# Patient Record
Sex: Male | Born: 1990 | Race: Black or African American | Hispanic: No | Marital: Single
Health system: Southern US, Community
[De-identification: ages and names within clinical notes are randomized; demographics above are authoritative.]

## PROBLEM LIST (undated history)

## (undated) ENCOUNTER — Ambulatory Visit (HOSPITAL_COMMUNITY): Payer: No Payment, Other

## (undated) ENCOUNTER — Emergency Department (HOSPITAL_COMMUNITY): Disposition: A | Discharge status: Home / Self Care | Payer: PRIVATE HEALTH INSURANCE

---
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
## 2011-10-09 ENCOUNTER — Emergency Department (HOSPITAL_COMMUNITY): Payer: PRIVATE HEALTH INSURANCE

## 2011-10-09 ENCOUNTER — Emergency Department (HOSPITAL_COMMUNITY)
Admission: EM | Admit: 2011-10-09 | Discharge: 2011-10-09 | Disposition: A | Discharge status: Home / Self Care | Payer: PRIVATE HEALTH INSURANCE | Attending: Emergency Medicine | Admitting: Emergency Medicine

## 2011-10-09 ENCOUNTER — Encounter (HOSPITAL_COMMUNITY): Payer: Self-pay | Admitting: Emergency Medicine

## 2011-10-09 DIAGNOSIS — S93409A Sprain of unspecified ligament of unspecified ankle, initial encounter: Secondary | ICD-10-CM | POA: Insufficient documentation

## 2011-10-09 DIAGNOSIS — M25579 Pain in unspecified ankle and joints of unspecified foot: Secondary | ICD-10-CM | POA: Insufficient documentation

## 2011-10-09 DIAGNOSIS — S93401A Sprain of unspecified ligament of right ankle, initial encounter: Secondary | ICD-10-CM

## 2011-10-09 MED ORDER — IBUPROFEN 800 MG PO TABS
800.0000 mg | ORAL_TABLET | Freq: Once | ORAL | Status: AC
  Administered 2011-10-09: 800 mg via ORAL
  Filled 2011-10-09: qty 1

## 2011-10-09 MED ORDER — IBUPROFEN 800 MG PO TABS: 800.0000 mg | ORAL_TABLET | Freq: Three times a day (TID) | ORAL | Status: AC

## 2011-10-09 NOTE — ED Notes (Signed)
Dr Opitz bedside 

## 2011-10-09 NOTE — ED Provider Notes (Signed)
History     CSN: [id number]  Arrival date & time 10/09/11  0458   First MD Initiated Contact with Patient 10/09/11 0502      Chief Complaint  Patient presents with  . Ankle Pain    (Consider location/radiation/quality/duration/timing/severity/associated sxs/prior treatment) HPI History provided by patient. Skateboarding tonight about 5 hours ago, down a hill. He lost control, jumped off a skateboard and rolled his right ankle. He complains of pain and swelling to the medial aspect of his ankle it hurts to walk on. No broken skin or bleeding. No knee or proximal fibular pain. No fall or extremity injury. No head or neck injury. No weakness or numbness. No history of ankle injury in the past. Pain is sharp in quality and not radiating. Moderate in severity.  No past medical history on file.  No past surgical history on file.  No family history on file.  History  Substance Use Topics  . Smoking status: Not on file  . Smokeless tobacco: Not on file  . Alcohol Use: Not on file      Review of Systems  Constitutional: Negative for fever and chills.  HENT: Negative for neck pain and neck stiffness.   Eyes: Negative for pain.  Respiratory: Negative for shortness of breath.   Cardiovascular: Negative for chest pain.  Gastrointestinal: Negative for abdominal pain.  Genitourinary: Negative for dysuria.  Musculoskeletal: Negative for back pain.  Skin: Negative for rash.  Neurological: Negative for headaches.  All other systems reviewed and are negative.    Allergies  Review of patient's allergies indicates not on file.  Home Medications  No current outpatient prescriptions on file.  There were no vitals taken for this visit.  Physical Exam  Constitutional: He is oriented to person, place, and time. He appears well-developed and well-nourished.  HENT:  Head: Normocephalic and atraumatic.  Eyes: Conjunctivae and EOM are normal. Pupils are equal, round, and reactive to  light.  Neck: Trachea normal. Neck supple. No thyromegaly present.  Cardiovascular: Normal rate, regular rhythm, S1 normal, S2 normal and normal pulses.     No systolic murmur is present   No diastolic murmur is present  Pulses:      Radial pulses are 2+ on the right side, and 2+ on the left side.  Pulmonary/Chest: Effort normal. No respiratory distress. He has no rhonchi.  Musculoskeletal:       Right lower extremity: Medial aspect of right ankle tender swelling. No ecchymosis. Equal dorsalis pedis pulses with distal neurovascular intact. Nontender foot, proximal fibula, knee and hip. No other extremity injury or tenderness  Neurological: He is alert and oriented to person, place, and time. He has normal strength. No sensory deficit. GCS eye subscore is 4. GCS verbal subscore is 5. GCS motor subscore is 6.  Skin: Skin is warm and dry. He is not diaphoretic.  Psychiatric: His speech is normal.       Cooperative and appropriate    ED Course  Procedures (including critical care time)  No results found for this or any previous visit. Dg Ankle Complete Right  10/09/2011  *RADIOLOGY REPORT*  Clinical Data: Status post skateboarding injury; pain at the right ankle, more prominent medially.  RIGHT ANKLE - COMPLETE 3+ VIEW  Comparison: None.  Findings: There is no evidence of acute fracture or dislocation. Cortical irregularity along the posterior malleolus is thought to reflect remote injury; suggest clinical correlation for associated symptoms.  The ankle mortise is intact; the interosseous space is within  normal limits.  No talar tilt or subluxation is seen.  The joint spaces are preserved.  Medial soft tissue swelling is noted.  IMPRESSION:  1.  No evidence of acute fracture or dislocation. 2.  Cortical irregularity along the posterior malleolus is thought to reflect remote injury; suggest clinical correlation for associated symptoms.  Original Report Authenticated By: Tonia Ghent, M.D.        MDM   Right ankle injury evaluated with x-rays. Ice and Motrin provided.  X-rays reviewed with patient. Given pain with ambulation, splint provided and crutches as needed. Reliable historian and agrees to followup for any persistent symptoms.       Sunnie Nielsen, MD 10/09/11 [phone]

## 2011-10-09 NOTE — ED Notes (Signed)
Pt alert, nad, c/o right ankle pain, onset last PM, pt injured while skate boarding, PMS intact, + Edema, no obvious deformity noted, resp even unlabored, skin pwd

## 2011-10-09 NOTE — ED Notes (Signed)
Air splint applied.  Crutch instruction given.

## 2012-10-20 ENCOUNTER — Emergency Department (HOSPITAL_COMMUNITY): Payer: PRIVATE HEALTH INSURANCE

## 2012-10-20 ENCOUNTER — Encounter (HOSPITAL_COMMUNITY): Payer: Self-pay | Admitting: Radiology

## 2012-10-20 ENCOUNTER — Inpatient Hospital Stay (HOSPITAL_COMMUNITY)
Admission: EM | Admit: 2012-10-20 | Discharge: 2012-10-24 | DRG: 085 | Disposition: A | Discharge status: Home / Self Care | Payer: PRIVATE HEALTH INSURANCE | Attending: General Surgery | Admitting: General Surgery

## 2012-10-20 DIAGNOSIS — S060X9A Concussion with loss of consciousness of unspecified duration, initial encounter: Secondary | ICD-10-CM

## 2012-10-20 DIAGNOSIS — S069X9A Unspecified intracranial injury with loss of consciousness of unspecified duration, initial encounter: Secondary | ICD-10-CM

## 2012-10-20 DIAGNOSIS — S0291XA Unspecified fracture of skull, initial encounter for closed fracture: Secondary | ICD-10-CM

## 2012-10-20 DIAGNOSIS — S0003XA Contusion of scalp, initial encounter: Secondary | ICD-10-CM | POA: Diagnosis present

## 2012-10-20 DIAGNOSIS — R561 Post traumatic seizures: Secondary | ICD-10-CM | POA: Diagnosis present

## 2012-10-20 DIAGNOSIS — S020XXA Fracture of vault of skull, initial encounter for closed fracture: Secondary | ICD-10-CM

## 2012-10-20 DIAGNOSIS — F17200 Nicotine dependence, unspecified, uncomplicated: Secondary | ICD-10-CM | POA: Diagnosis present

## 2012-10-20 DIAGNOSIS — J95821 Acute postprocedural respiratory failure: Secondary | ICD-10-CM | POA: Diagnosis present

## 2012-10-20 DIAGNOSIS — R1110 Vomiting, unspecified: Secondary | ICD-10-CM | POA: Diagnosis not present

## 2012-10-20 DIAGNOSIS — IMO0002 Reserved for concepts with insufficient information to code with codable children: Secondary | ICD-10-CM | POA: Diagnosis present

## 2012-10-20 DIAGNOSIS — R569 Unspecified convulsions: Secondary | ICD-10-CM

## 2012-10-20 DIAGNOSIS — I498 Other specified cardiac arrhythmias: Secondary | ICD-10-CM | POA: Diagnosis not present

## 2012-10-20 DIAGNOSIS — R4182 Altered mental status, unspecified: Secondary | ICD-10-CM

## 2012-10-20 DIAGNOSIS — S02109A Fracture of base of skull, unspecified side, initial encounter for closed fracture: Principal | ICD-10-CM | POA: Diagnosis present

## 2012-10-20 DIAGNOSIS — Y9351 Activity, roller skating (inline) and skateboarding: Secondary | ICD-10-CM

## 2012-10-20 DIAGNOSIS — J45902 Unspecified asthma with status asthmaticus: Secondary | ICD-10-CM | POA: Diagnosis present

## 2012-10-20 DIAGNOSIS — S06300A Unspecified focal traumatic brain injury without loss of consciousness, initial encounter: Principal | ICD-10-CM | POA: Diagnosis present

## 2012-10-20 DIAGNOSIS — S1093XA Contusion of unspecified part of neck, initial encounter: Secondary | ICD-10-CM | POA: Diagnosis present

## 2012-10-20 DIAGNOSIS — S065XAA Traumatic subdural hemorrhage with loss of consciousness status unknown, initial encounter: Secondary | ICD-10-CM

## 2012-10-20 DIAGNOSIS — S065X9A Traumatic subdural hemorrhage with loss of consciousness of unspecified duration, initial encounter: Secondary | ICD-10-CM

## 2012-10-20 DIAGNOSIS — J9600 Acute respiratory failure, unspecified whether with hypoxia or hypercapnia: Secondary | ICD-10-CM | POA: Diagnosis present

## 2012-10-20 DIAGNOSIS — G9389 Other specified disorders of brain: Secondary | ICD-10-CM | POA: Diagnosis present

## 2012-10-20 DIAGNOSIS — S0990XA Unspecified injury of head, initial encounter: Secondary | ICD-10-CM

## 2012-10-20 DIAGNOSIS — J9690 Respiratory failure, unspecified, unspecified whether with hypoxia or hypercapnia: Secondary | ICD-10-CM

## 2012-10-20 DIAGNOSIS — S0083XA Contusion of other part of head, initial encounter: Secondary | ICD-10-CM

## 2012-10-20 DIAGNOSIS — S060XAA Concussion with loss of consciousness status unknown, initial encounter: Secondary | ICD-10-CM

## 2012-10-20 LAB — COMPREHENSIVE METABOLIC PANEL WITH GFR
Alkaline Phosphatase: 74 U/L (ref 39–117)
BUN: 20 mg/dL (ref 6–23)
CO2: 18 meq/L — ABNORMAL LOW (ref 19–32)
Calcium: 9.5 mg/dL (ref 8.4–10.5)
GFR calc Af Amer: 81 mL/min — ABNORMAL LOW (ref >90)
GFR calc non Af Amer: 70 mL/min — ABNORMAL LOW (ref >90)
Glucose, Bld: 140 mg/dL — ABNORMAL HIGH (ref 70–99)
Total Protein: 8.2 g/dL (ref 6.0–8.3)

## 2012-10-20 LAB — POCT I-STAT, CHEM 8
BUN: 23 mg/dL (ref 6–23)
Calcium, Ion: 1.23 mmol/L (ref 1.12–1.23)
Chloride: 110 meq/L (ref 96–112)
Creatinine, Ser: 1.2 mg/dL (ref 0.50–1.35)
Glucose, Bld: 132 mg/dL — ABNORMAL HIGH (ref 70–99)
HCT: 47 % (ref 39.0–52.0)
Hemoglobin: 16 g/dL (ref 13.0–17.0)
Potassium: 3.8 meq/L (ref 3.5–5.1)
Sodium: 143 mEq/L (ref 135–145)
TCO2: 20 mmol/L (ref 0–100)

## 2012-10-20 LAB — URINALYSIS, ROUTINE W REFLEX MICROSCOPIC
Bilirubin Urine: NEGATIVE
Glucose, UA: NEGATIVE mg/dL
Ketones, ur: NEGATIVE mg/dL
Leukocytes, UA: NEGATIVE
Nitrite: NEGATIVE
Protein, ur: 30 mg/dL — AB
Specific Gravity, Urine: 1.039 — ABNORMAL HIGH (ref 1.005–1.030)
Urobilinogen, UA: 0.2 mg/dL (ref 0.0–1.0)
pH: 5 (ref 5.0–8.0)

## 2012-10-20 LAB — URINE MICROSCOPIC-ADD ON

## 2012-10-20 LAB — COMPREHENSIVE METABOLIC PANEL
ALT: 17 U/L (ref 0–53)
AST: 30 U/L (ref 0–37)
Albumin: 4.8 g/dL (ref 3.5–5.2)
Chloride: 104 mEq/L (ref 96–112)
Creatinine, Ser: 1.41 mg/dL — ABNORMAL HIGH (ref 0.50–1.35)
Potassium: 3.9 mEq/L (ref 3.5–5.1)
Sodium: 143 mEq/L (ref 135–145)
Total Bilirubin: 0.7 mg/dL (ref 0.3–1.2)

## 2012-10-20 LAB — CBC WITH DIFFERENTIAL/PLATELET
Basophils Absolute: 0 10*3/uL (ref 0.0–0.1)
Basophils Relative: 0 % (ref 0–1)
Eosinophils Absolute: 0.6 K/uL (ref 0.0–0.7)
Eosinophils Relative: 4 % (ref 0–5)
HCT: 42.8 % (ref 39.0–52.0)
Hemoglobin: 14 g/dL (ref 13.0–17.0)
Lymphocytes Relative: 54 % — ABNORMAL HIGH (ref 12–46)
Lymphs Abs: 8.7 K/uL — ABNORMAL HIGH (ref 0.7–4.0)
MCH: 31.6 pg (ref 26.0–34.0)
MCHC: 32.7 g/dL (ref 30.0–36.0)
MCV: 96.6 fL (ref 78.0–100.0)
Monocytes Absolute: 1.3 K/uL — ABNORMAL HIGH (ref 0.1–1.0)
Monocytes Relative: 8 % (ref 3–12)
Neutro Abs: 5.5 K/uL (ref 1.7–7.7)
Neutrophils Relative %: 34 % — ABNORMAL LOW (ref 43–77)
Platelets: 259 10*3/uL (ref 150–400)
RBC: 4.43 MIL/uL (ref 4.22–5.81)
RDW: 12.3 % (ref 11.5–15.5)
WBC: 16.1 10*3/uL — ABNORMAL HIGH (ref 4.0–10.5)

## 2012-10-20 LAB — TYPE AND SCREEN
ABO/RH(D): O NEG
Antibody Screen: NEGATIVE
Unit division: 0
Unit division: 0

## 2012-10-20 LAB — RAPID URINE DRUG SCREEN, HOSP PERFORMED
Amphetamines: NOT DETECTED
Barbiturates: NOT DETECTED
Benzodiazepines: POSITIVE — AB
Cocaine: NOT DETECTED
Opiates: NOT DETECTED
Tetrahydrocannabinol: POSITIVE — AB

## 2012-10-20 LAB — POCT I-STAT 3, ART BLOOD GAS (G3+)
Acid-base deficit: 4 mmol/L — ABNORMAL HIGH (ref 0.0–2.0)
Bicarbonate: 21.4 meq/L (ref 20.0–24.0)
O2 Saturation: 100 %
Patient temperature: 98.6
TCO2: 23 mmol/L (ref 0–100)
pCO2 arterial: 41.4 mmHg (ref 35.0–45.0)
pH, Arterial: 7.322 — ABNORMAL LOW (ref 7.350–7.450)
pO2, Arterial: 416 mmHg — ABNORMAL HIGH (ref 80.0–100.0)

## 2012-10-20 LAB — ETHANOL: Alcohol, Ethyl (B): <11 mg/dL (ref 0–11)

## 2012-10-20 LAB — CG4 I-STAT (LACTIC ACID): Lactic Acid, Venous: 12.04 mmol/L — ABNORMAL HIGH (ref 0.5–2.2)

## 2012-10-20 LAB — ABO/RH: ABO/RH(D): O NEG

## 2012-10-20 MED ORDER — SUCCINYLCHOLINE CHLORIDE 20 MG/ML IJ SOLN
INTRAMUSCULAR | Status: AC
  Administered 2012-10-20: 80 mg
  Filled 2012-10-20: qty 1

## 2012-10-20 MED ORDER — POTASSIUM CHLORIDE IN NACL 20-0.9 MEQ/L-% IV SOLN
INTRAVENOUS | Status: DC
  Administered 2012-10-20 – 2012-10-23 (×7): via INTRAVENOUS
  Filled 2012-10-20 (×9): qty 1000

## 2012-10-20 MED ORDER — PROPOFOL 10 MG/ML IV EMUL: 5.0000 ug/kg/min | Freq: Once | INTRAVENOUS | Status: DC

## 2012-10-20 MED ORDER — CHLORHEXIDINE GLUCONATE 0.12 % MT SOLN
15.0000 mL | Freq: Two times a day (BID) | OROMUCOSAL | Status: DC
  Administered 2012-10-20 – 2012-10-21 (×2): 15 mL via OROMUCOSAL
  Filled 2012-10-20 (×2): qty 15

## 2012-10-20 MED ORDER — MIDAZOLAM HCL 2 MG/2ML IJ SOLN
INTRAMUSCULAR | Status: AC
  Administered 2012-10-20: 4 mg
  Filled 2012-10-20: qty 4

## 2012-10-20 MED ORDER — PNEUMOCOCCAL VAC POLYVALENT 25 MCG/0.5ML IJ INJ
0.5000 mL | INJECTION | INTRAMUSCULAR | Status: AC
  Administered 2012-10-22: 0.5 mL via INTRAMUSCULAR
  Filled 2012-10-20 (×2): qty 0.5

## 2012-10-20 MED ORDER — ONDANSETRON HCL 4 MG/2ML IJ SOLN
4.0000 mg | Freq: Four times a day (QID) | INTRAMUSCULAR | Status: DC | PRN
  Administered 2012-10-21 (×2): 4 mg via INTRAVENOUS
  Filled 2012-10-20 (×3): qty 2

## 2012-10-20 MED ORDER — PANTOPRAZOLE SODIUM 40 MG PO TBEC
40.0000 mg | DELAYED_RELEASE_TABLET | Freq: Every day | ORAL | Status: DC
  Administered 2012-10-22 – 2012-10-23 (×2): 40 mg via ORAL
  Filled 2012-10-20 (×2): qty 1

## 2012-10-20 MED ORDER — ONDANSETRON HCL 4 MG/2ML IJ SOLN
4.0000 mg | Freq: Once | INTRAMUSCULAR | Status: AC
  Administered 2012-10-20: 4 mg via INTRAVENOUS

## 2012-10-20 MED ORDER — MORPHINE SULFATE 4 MG/ML IJ SOLN: 4.0000 mg | INTRAMUSCULAR | Status: DC | PRN

## 2012-10-20 MED ORDER — LIDOCAINE HCL (CARDIAC) 20 MG/ML IV SOLN
INTRAVENOUS | Status: AC
  Filled 2012-10-20: qty 5

## 2012-10-20 MED ORDER — ONDANSETRON HCL 4 MG PO TABS: 4.0000 mg | ORAL_TABLET | Freq: Four times a day (QID) | ORAL | Status: DC | PRN

## 2012-10-20 MED ORDER — MIDAZOLAM HCL 2 MG/2ML IJ SOLN
4.0000 mg | INTRAMUSCULAR | Status: DC | PRN
  Administered 2012-10-21 (×2): 4 mg via INTRAVENOUS
  Filled 2012-10-20: qty 4
  Filled 2012-10-20: qty 2

## 2012-10-20 MED ORDER — BIOTENE DRY MOUTH MT LIQD
15.0000 mL | Freq: Four times a day (QID) | OROMUCOSAL | Status: DC
  Administered 2012-10-20 – 2012-10-21 (×4): 15 mL via OROMUCOSAL

## 2012-10-20 MED ORDER — ROCURONIUM BROMIDE 50 MG/5ML IV SOLN
INTRAVENOUS | Status: AC
  Filled 2012-10-20: qty 2

## 2012-10-20 MED ORDER — PANTOPRAZOLE SODIUM 40 MG IV SOLR
40.0000 mg | Freq: Every day | INTRAVENOUS | Status: DC
  Administered 2012-10-20: 40 mg via INTRAVENOUS
  Filled 2012-10-20 (×5): qty 40

## 2012-10-20 MED ORDER — MIDAZOLAM BOLUS VIA INFUSION: 4.0000 mg | INTRAVENOUS | Status: DC | PRN

## 2012-10-20 MED ORDER — PROPOFOL 10 MG/ML IV EMUL
5.0000 ug/kg/min | Freq: Once | INTRAVENOUS | Status: DC
  Administered 2012-10-20: 30 ug/kg/min via INTRAVENOUS
  Administered 2012-10-20: 5 ug/kg/min via INTRAVENOUS

## 2012-10-20 MED ORDER — ETOMIDATE 2 MG/ML IV SOLN
INTRAVENOUS | Status: AC
  Administered 2012-10-20: 20 mg
  Filled 2012-10-20: qty 20

## 2012-10-20 MED ORDER — PROPOFOL 10 MG/ML IV EMUL
5.0000 ug/kg/min | INTRAVENOUS | Status: DC
  Administered 2012-10-20 – 2012-10-21 (×4): 70 ug/kg/min via INTRAVENOUS
  Filled 2012-10-20 (×4): qty 100

## 2012-10-20 MED ORDER — SODIUM CHLORIDE 0.9 % IV SOLN
INTRAVENOUS | Status: DC
  Administered 2012-10-20: 20:00:00 via INTRAVENOUS

## 2012-10-20 MED ORDER — IOHEXOL 300 MG/ML  SOLN
100.0000 mL | Freq: Once | INTRAMUSCULAR | Status: AC | PRN
  Administered 2012-10-20: 100 mL via INTRAVENOUS

## 2012-10-20 NOTE — ED Notes (Signed)
Respiratory at bedside for blood gas.

## 2012-10-20 NOTE — Progress Notes (Signed)
Chaplain Note: Responded to Trauma Room A for a Level 1 Trauma...head injury.  Pt intubated and not able to converse. Provided emotional support for pt's mother and father. Escorted them to waiting area on 3rd floor when pt was moved to ICU.  Will continue to follow up as needed.  Rutherford Nail Chaplain Resident [phone]

## 2012-10-20 NOTE — ED Provider Notes (Signed)
I saw and evaluated the patient, reviewed the resident's note and I agree with the findings and plan.  I saw and was at bedisde for airway management, trauma resuscitation with Dr. Donette Larry.  Pt intubated for airway protection, altered mental status after head injury, fall while skateboarding apparently.  Pt may have had seizure PTA with EMS who gave 2.5 mg versed en route. Pt on board, ccollar in place, IV placed.  Unknown PMH, allergies, medications.  CRITICAL CARE Performed by: Lear Ng Total critical care time: 30 min Critical care time was exclusive of separately billable procedures and treating other patients. Critical care was necessary to treat or prevent imminent or life-threatening deterioration. Critical care was time spent personally by me on the following activities: development of treatment plan with patient and/or surrogate as well as nursing, discussions with consultants, evaluation of patient's response to treatment, examination of patient, obtaining history from patient or surrogate, ordering and performing treatments and interventions, ordering and review of laboratory studies, ordering and review of radiographic studies, pulse oximetry and re-evaluation of patient's condition.   Level 1 trauma called, Dr. Carolynne Edouard at bedside.  PT will need emergent CTs of head, ccspine, also abd/pelvis since unknown exact history and mechanism.  Level 5 caveat.   Filed Vitals:   10/20/12 1934  BP: 178/72  Pulse: 50  Temp: 97.8 F (36.6 C)  Resp: 14     Impression: Respiratory failure following presumed head injury Altered mental status Seizure   Gavin Pound. Nik Gorrell, MD 10/21/12 1610

## 2012-10-20 NOTE — ED Notes (Addendum)
Per EMS: pt was skateboarding fell off of skateboard. Pt had bystanders that checked on him. Pt was unconsious with bystanders and then became responsive. When EMS arrived pt was not responding to questions. Pt had 2 focal seizures with EMS in route to hospital. Pt was bradycardic and hypertensive.

## 2012-10-20 NOTE — Consult Note (Signed)
NEURO HOSPITALIST CONSULT NOTE    Reason for Consult: Closed head injury with subsequent generalized seizure.  HPI:                                                                                                                                          Christian Hartman is an 22 y.o. male with no known medical history other than asthma who was brought to the emergency room after falling from a skateboard and hitting the back of his head. Patient reportedly was immediately lucid but subsequently experienced a witnessed generalized seizure. He was given 2.5 mg of Versed and subsequently was intubated after arriving in the emergency room unresponsive. No further seizure activity has been reported. CT scan of his head showed right posterior parietal scalp hematoma no skull fracture and no intracranial abnormality. CT scan of cervical spine was negative for fracture/subluxation. Patient was afebrile. Blood glucose was 132. Serum electrolytes were unremarkable. Urine drug screen results are pending. Patient has no history of seizure activity.  Past medical history: asthma   History reviewed. No pertinent past surgical history.  History reviewed. No pertinent family history.   Social History:  has no tobacco, alcohol, and drug history on file.  Not on File  MEDICATIONS:                                                                                                                     Prior to Admission:  none  ROS:                                                                                                                                       History obtained from patient's  mother.  General ROS: negative for - chills, fatigue, fever, night sweats, weight gain or weight loss Psychological ROS: negative for - behavioral disorder, hallucinations, memory difficulties, mood swings or suicidal ideation Ophthalmic ROS: negative for - blurry vision, double vision, eye pain or loss  of vision ENT ROS: negative for - epistaxis, nasal discharge, oral lesions, sore throat, tinnitus or vertigo Allergy and Immunology ROS: negative for - hives or itchy/watery eyes Hematological and Lymphatic ROS: negative for - bleeding problems, bruising or swollen lymph nodes Endocrine ROS: negative for - galactorrhea, hair pattern changes, polydipsia/polyuria or temperature intolerance Respiratory ROS: negative for - cough, hemoptysis, shortness of breath or wheezing Cardiovascular ROS: negative for - chest pain, dyspnea on exertion, edema or irregular heartbeat Gastrointestinal ROS: negative for - abdominal pain, diarrhea, hematemesis, nausea/vomiting or stool incontinence Genito-Urinary ROS: negative for - dysuria, hematuria, incontinence or urinary frequency/urgency Musculoskeletal ROS: negative for - joint swelling or muscular weakness Neurological ROS: as noted in HPI Dermatological ROS: negative for rash and skin lesion changes   Blood pressure 122/66, pulse 79, temperature 97.8 F (36.6 C), temperature source Axillary, resp. rate 27, height 6' (1.829 m), weight 76 kg (167 lb 8.8 oz), SpO2 100.00%.   Neurologic Examination:                                                                                                      Patient was intubated and on mechanical ventilation. He was sedated with propofol 10 mcg per kilogram per minute IV. He was unresponsive to external stimuli, including noxious stimuli. Pupils were equal and reacted normally to light. Extraocular movements were intact and conjugate with oculocephalic maneuvers. No facial weakness was noted. Muscle tone was flaccid throughout. Patient had no spontaneous movements and no abnormal posturing. Deep tendon reflexes were 1+ and symmetrical. Plantar responses were mute bilaterally.  No results found for this basename: cbc, bmp, coags, chol, tri, ldl, hga1c    Results for orders placed during the hospital encounter of  10/20/12 (from the past 48 hour(s))  TYPE AND SCREEN     Status: None   Collection Time    10/20/12  7:35 PM      Result Value Range   ABO/RH(D) O NEG     Antibody Screen NEG     Sample Expiration 10/23/2012     Unit Number [id number]     Blood Component Type RED CELLS,LR     Unit division 00     Status of Unit ISSUED     Unit tag comment VERBAL ORDERS PER DR GHIM     Transfusion Status OK TO TRANSFUSE     Crossmatch Result PENDING     Unit Number [id number]     Blood Component Type RED CELLS,LR     Unit division 00     Status of Unit ISSUED     Unit tag comment VERBAL ORDERS PER DR GHIM     Transfusion Status OK TO TRANSFUSE     Crossmatch Result PENDING    ABO/RH     Status: None   Collection  Time    10/20/12  7:35 PM      Result Value Range   ABO/RH(D) O NEG    POCT I-STAT, CHEM 8     Status: Abnormal   Collection Time    10/20/12  7:47 PM      Result Value Range   Sodium 143  135 - 145 mEq/L   Potassium 3.8  3.5 - 5.1 mEq/L   Chloride 110  96 - 112 mEq/L   BUN 23  6 - 23 mg/dL   Creatinine, Ser 1.61  0.50 - 1.35 mg/dL   Glucose, Bld 096 (*) 70 - 99 mg/dL   Calcium, Ion 0.45  4.09 - 1.23 mmol/L   TCO2 20  0 - 100 mmol/L   Hemoglobin 16.0  13.0 - 17.0 g/dL   HCT 81.1  91.4 - 78.2 %  CBC WITH DIFFERENTIAL     Status: Abnormal   Collection Time    10/20/12  7:49 PM      Result Value Range   WBC 16.1 (*) 4.0 - 10.5 K/uL   RBC 4.43  4.22 - 5.81 MIL/uL   Hemoglobin 14.0  13.0 - 17.0 g/dL   HCT 95.6  21.3 - 08.6 %   MCV 96.6  78.0 - 100.0 fL   MCH 31.6  26.0 - 34.0 pg   MCHC 32.7  30.0 - 36.0 g/dL   RDW 57.8  46.9 - 62.9 %   Platelets 259  150 - 400 K/uL   Neutrophils Relative % 34 (*) 43 - 77 %   Lymphocytes Relative 54 (*) 12 - 46 %   Monocytes Relative 8  3 - 12 %   Eosinophils Relative 4  0 - 5 %   Basophils Relative 0  0 - 1 %   Neutro Abs 5.5  1.7 - 7.7 K/uL   Lymphs Abs 8.7 (*) 0.7 - 4.0 K/uL   Monocytes Absolute 1.3 (*) 0.1 - 1.0 K/uL    Eosinophils Absolute 0.6  0.0 - 0.7 K/uL   Basophils Absolute 0.0  0.0 - 0.1 K/uL   RBC Morphology BURR CELLS    COMPREHENSIVE METABOLIC PANEL     Status: Abnormal   Collection Time    10/20/12  7:49 PM      Result Value Range   Sodium 143  135 - 145 mEq/L   Potassium 3.9  3.5 - 5.1 mEq/L   Chloride 104  96 - 112 mEq/L   CO2 18 (*) 19 - 32 mEq/L   Glucose, Bld 140 (*) 70 - 99 mg/dL   BUN 20  6 - 23 mg/dL   Creatinine, Ser 5.28 (*) 0.50 - 1.35 mg/dL   Calcium 9.5  8.4 - 41.3 mg/dL   Total Protein 8.2  6.0 - 8.3 g/dL   Albumin 4.8  3.5 - 5.2 g/dL   AST 30  0 - 37 U/L   ALT 17  0 - 53 U/L   Alkaline Phosphatase 74  39 - 117 U/L   Total Bilirubin 0.7  0.3 - 1.2 mg/dL   GFR calc non Af Amer 70 (*) >90 mL/min   GFR calc Af Amer 81 (*) >90 mL/min   Comment:            The eGFR has been calculated     using the CKD EPI equation.     This calculation has not been     validated in all clinical     situations.  eGFR's persistently     <90 mL/min signify     possible Chronic Kidney Disease.  ETHANOL     Status: None   Collection Time    10/20/12  7:49 PM      Result Value Range   Alcohol, Ethyl (B) <11  0 - 11 mg/dL   Comment:            LOWEST DETECTABLE LIMIT FOR     SERUM ALCOHOL IS 11 mg/dL     FOR MEDICAL PURPOSES ONLY  CG4 I-STAT (LACTIC ACID)     Status: Abnormal   Collection Time    10/20/12  8:04 PM      Result Value Range   Lactic Acid, Venous 12.04 (*) 0.5 - 2.2 mmol/L  POCT I-STAT 3, BLOOD GAS (G3+)     Status: Abnormal   Collection Time    10/20/12  8:38 PM      Result Value Range   pH, Arterial 7.322 (*) 7.350 - 7.450   pCO2 arterial 41.4  35.0 - 45.0 mmHg   pO2, Arterial 416.0 (*) 80.0 - 100.0 mmHg   Bicarbonate 21.4  20.0 - 24.0 mEq/L   TCO2 23  0 - 100 mmol/L   O2 Saturation 100.0     Acid-base deficit 4.0 (*) 0.0 - 2.0 mmol/L   Patient temperature 98.6 F     Collection site RADIAL, ALLEN'S TEST ACCEPTABLE     Drawn by RT     Sample type ARTERIAL       Ct Head Wo Contrast  10/20/2012   *RADIOLOGY REPORT*  Clinical Data:  Level I trauma.  Skateboarding head injury. Unconscious.  CT HEAD WITHOUT CONTRAST CT CERVICAL SPINE WITHOUT CONTRAST  Technique:  Multidetector CT imaging of the head and cervical spine was performed following the standard protocol without intravenous contrast.  Multiplanar CT image reconstructions of the cervical spine were also generated.  Comparison:   None  CT HEAD  Findings: There is no evidence of intracranial hemorrhage, brain edema or other signs of acute infarction.  There is no evidence of intracranial mass lesion or mass effect.  No abnormal extra-axial fluid collections are identified.  Ventricles are normal in size.  No evidence of skull fracture or pneumocephalus.  Right posterior parietal scalp hematoma noted.  IMPRESSION: Right posterior parietal scalp hematoma.  No evidence of skull fracture or intracranial abnormality.  CT CERVICAL SPINE  Findings: No evidence of cervical spine fracture or subluxation. Intervertebral disc spaces are maintained.  No evidence of facet DJD or other bone abnormality.  IMPRESSION: Negative.  No evidence of cervical spine fracture or subluxation.   Original Report Authenticated By: Myles Rosenthal, M.D.   Ct Cervical Spine Wo Contrast  10/20/2012   *RADIOLOGY REPORT*  Clinical Data:  Level I trauma.  Skateboarding head injury. Unconscious.  CT HEAD WITHOUT CONTRAST CT CERVICAL SPINE WITHOUT CONTRAST  Technique:  Multidetector CT imaging of the head and cervical spine was performed following the standard protocol without intravenous contrast.  Multiplanar CT image reconstructions of the cervical spine were also generated.  Comparison:   None  CT HEAD  Findings: There is no evidence of intracranial hemorrhage, brain edema or other signs of acute infarction.  There is no evidence of intracranial mass lesion or mass effect.  No abnormal extra-axial fluid collections are identified.  Ventricles are  normal in size.  No evidence of skull fracture or pneumocephalus.  Right posterior parietal scalp hematoma noted.  IMPRESSION: Right posterior parietal  scalp hematoma.  No evidence of skull fracture or intracranial abnormality.  CT CERVICAL SPINE  Findings: No evidence of cervical spine fracture or subluxation. Intervertebral disc spaces are maintained.  No evidence of facet DJD or other bone abnormality.  IMPRESSION: Negative.  No evidence of cervical spine fracture or subluxation.   Original Report Authenticated By: Myles Rosenthal, M.D.   Dg Chest Portable 1 View  10/20/2012   *RADIOLOGY REPORT*  Clinical Data: Level I trauma.  Head injury.  Unresponsive.  Acute respiratory failure.  Intubation.  PORTABLE CHEST - 1 VIEW  Comparison: None.  Findings: Endotracheal tube is seen in appropriate position with tip approximately 4 cm above the carina.  Both lungs are clear.  No evidence of pneumothorax or hemothorax.  Heart size is normal.  No evidence of abnormal mediastinal widening or tracheal deviation.  IMPRESSION: Endotracheal tube in appropriate position.  No acute findings.   Original Report Authenticated By: Myles Rosenthal, M.D.    Assessment/Plan: Closed head injury with posttraumatic generalized seizure. CT showed no signs of acute intracranial hemorrhage. Focal cerebral contusion cannot be ruled out at this point.  Recommendations: 1. Repeat CT scan of the head without contrast in the a.m. for comparison with current studies to rule out interval development of intracranial hemorrhage as well as to rule out signs of contusion and cerebral edema. 2. EEG in the a.m., routine adult. 3. No anticonvulsant medication recommended unless patient has a recurrent seizure or EEG indicates increased risk for seizure recurrence.  We will continue to follow this patient with you.   Venetia Maxon M.D. [facility] [phone]  10/20/2012, 8:45 PM

## 2012-10-20 NOTE — H&P (Signed)
Christian Hartman is an 22 y.o. male.   Chief Complaint: Fall HPI:  The pt is a 22 yo bm who was skateboarding and fell. By report he got up and was alert and then had a seizure. Question of some posturing but has been a GCS 3 since seizure. Intubated after arrival to ER. Pot found in his backpack  History reviewed. No pertinent past medical history.  No past surgical history on file.  No family history on file. Social History:  has no tobacco, alcohol, and drug history on file.  Allergies: Not on File   (Not in a hospital admission)  Results for orders placed during the hospital encounter of 10/20/12 (from the past 48 hour(s))  POCT I-STAT, CHEM 8     Status: Abnormal   Collection Time    10/20/12  7:47 PM      Result Value Range   Sodium 143  135 - 145 mEq/L   Potassium 3.8  3.5 - 5.1 mEq/L   Chloride 110  96 - 112 mEq/L   BUN 23  6 - 23 mg/dL   Creatinine, Ser 1.61  0.50 - 1.35 mg/dL   Glucose, Bld 096 (*) 70 - 99 mg/dL   Calcium, Ion 0.45  4.09 - 1.23 mmol/L   TCO2 20  0 - 100 mmol/L   Hemoglobin 16.0  13.0 - 17.0 g/dL   HCT 81.1  91.4 - 78.2 %  CBC WITH DIFFERENTIAL     Status: Abnormal (Preliminary result)   Collection Time    10/20/12  7:49 PM      Result Value Range   WBC 16.1 (*) 4.0 - 10.5 K/uL   RBC 4.43  4.22 - 5.81 MIL/uL   Hemoglobin 14.0  13.0 - 17.0 g/dL   HCT 95.6  21.3 - 08.6 %   MCV 96.6  78.0 - 100.0 fL   MCH 31.6  26.0 - 34.0 pg   MCHC 32.7  30.0 - 36.0 g/dL   RDW 57.8  46.9 - 62.9 %   Platelets 259  150 - 400 K/uL   Neutrophils Relative % PENDING  43 - 77 %   Neutro Abs PENDING  1.7 - 7.7 K/uL   Band Neutrophils PENDING  0 - 10 %   Lymphocytes Relative PENDING  12 - 46 %   Lymphs Abs PENDING  0.7 - 4.0 K/uL   Monocytes Relative PENDING  3 - 12 %   Monocytes Absolute PENDING  0.1 - 1.0 K/uL   Eosinophils Relative PENDING  0 - 5 %   Eosinophils Absolute PENDING  0.0 - 0.7 K/uL   Basophils Relative PENDING  0 - 1 %   Basophils Absolute PENDING  0.0  - 0.1 K/uL   WBC Morphology PENDING     RBC Morphology PENDING     Smear Review PENDING     nRBC PENDING  0 /100 WBC   Metamyelocytes Relative PENDING     Myelocytes PENDING     Promyelocytes Absolute PENDING     Blasts PENDING     No results found.  Review of Systems  Unable to perform ROS   Blood pressure 178/72, pulse 50, temperature 97.8 F (36.6 C), temperature source Axillary, resp. rate 14, height 6' (1.829 m), weight 167 lb 8.8 oz (76 kg), SpO2 100.00%. Physical Exam  Constitutional: He appears well-developed and well-nourished.  HENT:  Head: Normocephalic and atraumatic.  Eyes: Conjunctivae and EOM are normal. Pupils are equal, round, and reactive to light.  Neck: Normal range of motion. Neck supple.  Cardiovascular: Normal rate, regular rhythm and normal heart sounds.   Respiratory: Effort normal and breath sounds normal.  GI: Soft. Bowel sounds are normal.  Musculoskeletal:  Extensor postured at one point. Now GCS 3. Intubated  Neurological:  GCS 3 Intubated  Skin: Skin is warm and dry.     Assessment/Plan Fall No blood on brain. I suspect he had a post traumatic seizure and that is the reason for his low GCS. Will admit to Trauma to icu. Will consult Neurology  TOTH III,Abdikadir Fohl S 10/20/2012, 8:15 PM

## 2012-10-20 NOTE — ED Notes (Signed)
Family at bedside. 

## 2012-10-20 NOTE — ED Notes (Signed)
Family updated as to patient's status.

## 2012-10-20 NOTE — ED Notes (Signed)
Dr Stewart at bedside.

## 2012-10-20 NOTE — Progress Notes (Signed)
Patient transported to 3110 with no complications.

## 2012-10-20 NOTE — ED Provider Notes (Signed)
History     CSN: [id number]  Arrival date & time 10/20/12  1933   First MD Initiated Contact with Patient 10/20/12 1945      No chief complaint on file.   (Consider location/radiation/quality/duration/timing/severity/associated sxs/prior treatment) HPI Comments: 22 y.o. male who presents to the Er as a level 1 trauma code. EMS states that per by standards, pt was riding his skateboard, and then had a fall, and then suffered a seizure. En route, EMS states pt had 2 more seizures, and was nonresponsive to them. Pt was given versed en route (2mg ). Upon arrival to the Er, pt was nonresponsive, not protecting his airway.   Patient is a 22 y.o. male presenting with general illness. The history is provided by the spouse and the EMS personnel.  Illness  The current episode started today. The onset was sudden. The problem occurs rarely. The problem has been rapidly worsening. The problem is severe. Nothing relieves the symptoms. Nothing aggravates the symptoms. Associated symptoms include vomiting. Pertinent negatives include no fever.    No past medical history on file.  No past surgical history on file.  No family history on file.  History  Substance Use Topics  . Smoking status: Not on file  . Smokeless tobacco: Not on file  . Alcohol Use: Not on file      Review of Systems  Unable to perform ROS: Acuity of condition  Constitutional: Negative for fever.  Gastrointestinal: Positive for vomiting.    Allergies  Review of patient's allergies indicates not on file.  Home Medications  No current outpatient prescriptions on file.  There were no vitals taken for this visit.  Physical Exam  Constitutional: He appears well-developed. He appears distressed.  HENT:  Head: Normocephalic.  Right Ear: External ear normal.  Left Ear: External ear normal.  Eyes: Right eye exhibits no discharge. Left eye exhibits no discharge.  Pupils are reactive, they are deviated to the left   Neck:  In c-collar  Cardiovascular: Normal rate and normal heart sounds.   Pulmonary/Chest:  Pt with sonorous breath sounds   Abdominal: Soft. He exhibits no distension and no mass.  Genitourinary: Penis normal.  Musculoskeletal: He exhibits no edema and no tenderness.  Neurological:  Pt is not responding to painful stimuli, eyes are closed.   Skin: Skin is warm.    ED Course  INTUBATION Date/Time: 10/20/2012 11:25 PM Performed by: Bernadene Person Authorized by: Bernadene Person Consent: The procedure was performed in an emergent situation. Indications: airway protection and respiratory distress Intubation method: fiberoptic oral Patient status: unconscious Preoxygenation: nonrebreather mask and BVM Sedatives: etomidate Paralytic: succinylcholine Laryngoscope size: Miller 3 Tube size: 7.5 mm Tube type: cuffed Number of attempts: 1 Cricoid pressure: no Cords visualized: yes Post-procedure assessment: chest rise,  ETCO2 monitor and CO2 detector Breath sounds: equal Cuff inflated: yes ETT to lip: 23 cm Tube secured with: ETT holder Chest x-ray interpreted by me. Chest x-ray findings: endotracheal tube in appropriate position Comments: glidescope used as pt is in a c-collar. Easy intubation, used mac 3 glidescope. Grade 1 view.    (including critical care time)  Labs Reviewed - No data to display No results found.     MDM  Pt is not protecting airway en route, is nonresponsive, sonorous breath sounds, decision made to intubate to protect airway and due to worsening respiratory status.   Intubated without complications. Pt is then sent to Ct scanner, no significant bleed found, pt is admitted to trauma service for  further eval and care.   1. Respiratory failure   2. Head injury, acute, initial encounter   3. Altered mental status   4. Generalized seizure           Bernadene Person, MD 10/20/12 2328

## 2012-10-21 ENCOUNTER — Inpatient Hospital Stay (HOSPITAL_COMMUNITY): Payer: PRIVATE HEALTH INSURANCE

## 2012-10-21 DIAGNOSIS — S060X9A Concussion with loss of consciousness of unspecified duration, initial encounter: Secondary | ICD-10-CM

## 2012-10-21 DIAGNOSIS — J9600 Acute respiratory failure, unspecified whether with hypoxia or hypercapnia: Secondary | ICD-10-CM

## 2012-10-21 DIAGNOSIS — S060XAA Concussion with loss of consciousness status unknown, initial encounter: Secondary | ICD-10-CM

## 2012-10-21 LAB — BASIC METABOLIC PANEL
BUN: 13 mg/dL (ref 6–23)
CO2: 24 mEq/L (ref 19–32)
Chloride: 104 mEq/L (ref 96–112)
Glucose, Bld: 107 mg/dL — ABNORMAL HIGH (ref 70–99)
Potassium: 3.9 mEq/L (ref 3.5–5.1)
Sodium: 137 mEq/L (ref 135–145)

## 2012-10-21 LAB — CBC
HCT: 38.8 % — ABNORMAL LOW (ref 39.0–52.0)
Hemoglobin: 13.2 g/dL (ref 13.0–17.0)
RBC: 4.21 MIL/uL — ABNORMAL LOW (ref 4.22–5.81)
WBC: 16.6 10*3/uL — ABNORMAL HIGH (ref 4.0–10.5)

## 2012-10-21 LAB — MRSA PCR SCREENING: MRSA by PCR: NEGATIVE

## 2012-10-21 LAB — PATHOLOGIST SMEAR REVIEW: Path Review: REACTIVE

## 2012-10-21 MED ORDER — OXYCODONE HCL 5 MG PO TABS: 5.0000 mg | ORAL_TABLET | ORAL | Status: DC | PRN

## 2012-10-21 MED ORDER — DOUBLE ANTIBIOTIC 500-10000 UNIT/GM EX OINT
TOPICAL_OINTMENT | Freq: Two times a day (BID) | CUTANEOUS | Status: DC
  Administered 2012-10-21: 21:00:00 via TOPICAL
  Administered 2012-10-21: 1 via TOPICAL
  Administered 2012-10-22 – 2012-10-23 (×3): via TOPICAL
  Filled 2012-10-21: qty 15
  Filled 2012-10-21: qty 14.17

## 2012-10-21 MED ORDER — DOUBLE ANTIBIOTIC 500-10000 UNIT/GM EX OINT
TOPICAL_OINTMENT | Freq: Two times a day (BID) | CUTANEOUS | Status: DC
Start: 2012-10-21 — End: 2012-10-21
  Filled 2012-10-21 (×18): qty 1

## 2012-10-21 MED ORDER — PROCHLORPERAZINE EDISYLATE 5 MG/ML IJ SOLN
10.0000 mg | Freq: Four times a day (QID) | INTRAMUSCULAR | Status: DC | PRN
  Administered 2012-10-21: 10 mg via INTRAVENOUS
  Filled 2012-10-21 (×2): qty 2

## 2012-10-21 NOTE — Progress Notes (Signed)
Nursing 1100 Dr. Janee Morn made aware that patient is currently cooperating and safe without the use of the vail bed, will continue to assess.

## 2012-10-21 NOTE — Evaluation (Signed)
Occupational Therapy Evaluation Patient Details Name: Christian Hartman MRN: [id number] DOB: 28-May-1991 Today's Date: 10/21/2012 Time: 8295-6213 OT Time Calculation (min): 24 min  OT Assessment / Plan / Recommendation Clinical Impression   This 22 y.o. Male admitted after falling from skateboard, striking the back of his head.  Pt was up and alert after accident than suffered a seizure.  Pt was intubated in ED.   CT was negative.  GCS 3 possibly due to seizure.  Pt. Extubated 5/19 am, was agitated and required large amounts of sedation and anti nausea medication.  He presents to OT with lethargy, and Ranchos level VI (confused, appropriate).  Pt demonstrates decreased orientation to situation, impaired short term memory, impaired attention and awareness, complaint of dizziness, and impaired balance.  Medications may be playing into presentation on eval.  Pt will benefit from OT for the below listed deficits,  to maximize safety and independence with BADLs to allow him to return home with family at supervision to independent level.      OT Assessment  Patient needs continued OT Services    Follow Up Recommendations  OPOT (depending on progress acutely ;Supervision/Assistance - 24 hour (depending on progress)    Barriers to Discharge None    Equipment Recommendations  None recommended by OT    Recommendations for Other Services    Frequency  Min 2X/week    Precautions / Restrictions Precautions Precautions: Fall Restrictions Weight Bearing Restrictions: No       ADL  Lower Body Dressing: Simulated;Minimal assistance Where Assessed - Lower Body Dressing: Supported sit to stand Toilet Transfer: Minimal assistance Toilet Transfer Method: Sit to stand;Stand pivot Toilet Transfer Equipment: Comfort height toilet Transfers/Ambulation Related to ADLs: Pt ambulates with min A.  +2 needed for safety due to level of agitation earlier in the day ADL Comments: Pt would not participate in ADL tasks.   required max encouragement to ambulate    OT Diagnosis: Generalized weakness;Cognitive deficits;Disturbance of vision;Acute pain  OT Problem List: Decreased strength;Decreased activity tolerance;Impaired balance (sitting and/or standing);Impaired vision/perception;Decreased cognition;Decreased safety awareness;Pain OT Treatment Interventions: Self-care/ADL training;DME and/or AE instruction;Therapeutic activities;Cognitive remediation/compensation;Visual/perceptual remediation/compensation;Patient/family education;Balance training   OT Goals Acute Rehab OT Goals OT Goal Formulation: With patient Time For Goal Achievement: 10/28/12 Potential to Achieve Goals: Good ADL Goals Pt Will Perform Grooming: with modified independence;Standing at sink ADL Goal: Grooming - Progress: Goal set today Pt Will Perform Upper Body Bathing: with modified independence;Sitting, chair;Sitting, edge of bed ADL Goal: Upper Body Bathing - Progress: Goal set today Pt Will Perform Upper Body Dressing: with modified independence;Sitting, chair;Sitting, bed ADL Goal: Upper Body Dressing - Progress: Goal set today Pt Will Perform Lower Body Dressing: with modified independence;Sit to stand from chair;Sit to stand from bed ADL Goal: Lower Body Dressing - Progress: Goal set today Pt Will Transfer to Toilet: with modified independence;Ambulation;Comfort height toilet ADL Goal: Toilet Transfer - Progress: Goal set today Pt Will Perform Toileting - Clothing Manipulation: with modified independence;Standing ADL Goal: Toileting - Clothing Manipulation - Progress: Goal set today Pt Will Perform Toileting - Hygiene: Independently;Sitting on 3-in-1 or toilet ADL Goal: Toileting - Hygiene - Progress: Goal set today Pt Will Perform Tub/Shower Transfer: with modified independence;Ambulation;Tub transfer ADL Goal: Tub/Shower Transfer - Progress: Goal set today Additional ADL Goal #1: Pt will perform path finding activity with no  more than min cues ADL Goal: Additional Goal #1 - Progress: Goal set today Additional ADL Goal #2: Pt will participate in further visual assesment ADL Goal: Additional  Goal #2 - Progress: Goal set today  Visit Information  Last OT Received On: 10/21/12 Assistance Needed: +2 (for safety) PT/OT Co-Evaluation/Treatment: Yes    Subjective Data  Subjective: "Can I just sleep for a little bit longer?" Patient Stated Goal: To go back to bed   Prior Functioning     Home Living Lives With: Family Available Help at Discharge: Family Type of Home: House Home Access: Stairs to enter [employer] of Steps: 5 Home Layout: Two level;Able to live on main level with bedroom/bathroom;Full bath on main level Alternate Level Stairs-Number of Steps: flight to basement Bathroom Shower/Tub: Tub/shower unit;Curtain Bathroom Toilet: Standard Home Adaptive Equipment: None Prior Function Level of Independence: Independent Able to Take Stairs?: Yes 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  Pt does not exhibit adequate attention for further testing this date Perception Perception: Within Functional Limits (grossly) Praxis Praxis: Intact   Cognition  Cognition Arousal/Alertness: Lethargic Behavior During Therapy:  (lethargic) Overall Cognitive Status: Impaired/Different from baseline Area of Impairment: Orientation;Memory;Attention;Following commands;Safety/judgement;Awareness Orientation Level: Situation;Time (Did not know the date nor situation  despite being told) Current Attention Level: Sustained Memory: Decreased short-term memory Following Commands: Follows one step commands consistently Safety/Judgement: Decreased awareness of deficits General Comments: Pt lethargic - has had many meds during the day which could be contributing to lethargy.  Pt required max cues and encouragement to arrouse adequately to participate.  Pt was unable to recall accident or reason for hospitalization despite being told.  He was able to use calendar to orient to date, and was able to find room when ambulating on unit.    Rancho Levels of Cognitive Functioning Rancho Los Amigos Scales of Cognitive Functioning: Confused/appropriate    Extremity/Trunk Assessment Right Upper Extremity Assessment RUE ROM/Strength/Tone: Within functional levels RUE Coordination: WFL - gross/fine motor Left Upper Extremity Assessment LUE ROM/Strength/Tone: Within functional levels LUE Coordination: WFL - gross/fine motor Trunk Assessment Trunk Assessment: Normal     Mobility Bed Mobility Bed Mobility: Supine to Sit;Sitting - Scoot to [place] of Bed;Sit to Supine Supine to Sit: HOB flat;3: Mod assist Sitting - Scoot to Edge of Bed: 4: Min guard Sit to Supine: 5: Supervision Details for Bed Mobility Assistance: Pt required assist to move to EOB due to lethargy and not wanting to participate. Pt's LEs were moved off the bed and he grabbed to therapist's hand to assit with lifiting torso Transfers Transfers: Sit to Stand;Stand to Sit Sit to Stand: 4: Min assist;With upper extremity assist;From bed Stand to Sit: 4: Min assist;With upper extremity assist;To bed Transfer via Lift Equipment: Stedy Details for Transfer Assistance: Pt complaint of dizziness     Exercise     Balance     End of Session OT - End of Session Activity Tolerance: Patient limited by fatigue Patient left: in bed;with call bell/phone within reach Nurse Communication: Mobility status  GO      Jeani Hawking M 10/21/2012, 4:26 PM

## 2012-10-21 NOTE — Progress Notes (Signed)
Subjective: Patient has been thrashing in bed. On max dose(70 mcg/kg/min) propofol due to agiation.   Exam: Filed Vitals:   10/21/12 0900  BP: 109/62  Pulse:   Temp:   Resp: 22   Gen: In bed, NAD MS: Awake, MOves all extremities puprposefull, does not follow commands.  WU:[id number], moves eyes in both directions, though not clearly fixating or tracking.  Motor: good strength in all extremities.  Sensory:responds to stim x 4.   Impression: 22 yo M with post-concussive seizure and current agitation. Given his agitated state, a reliable exam is difficult as I did not want to pause sedation for patient safety reasons.   Immediate post-traumatic seizures are associated only with a small increased risk of long term epilepsy, I agree with no AED therapy for now unless EEG.   Recommendations: 1) EEG 2) Management of ventilator/agitation per CCM.  3) Re-examine after extubation/paused sedation.    Ritta Slot, MD [facility] [phone]  If 7pm- 7am, please page neurology on call at [phone].

## 2012-10-21 NOTE — Progress Notes (Addendum)
Nursing [phone] Patient very agitated while intubated and on the ventilator.  Patient on max dose of Propofol gtt.  Trauma notified that patient is very difficult to sedate and very agitated at this time.  Dr. Janee Morn to bedside to assess patient.  Sedation stopped at 0800.  Patient extubated at 0810 from ventilator per Dr. Janee Morn, who was at bedside.  Patient became very agitated, removing all equipment and gown.  Patient became combative and security was called to the bedside.  MD at the bedside.  RN removed patient's foley catheter per MD request.  Patient proceeded to remove both of his PIVs.  Will leave out at this time per MD.  Patient vomitted x1, MD aware.  Security at bedside due to patient attempting to get OOB and being combative.  Family to room and made aware of patient's current status.  MD to order vail bed for safety and plan to move patient to a front room of the nursing station for safety.  Will continue to assess.

## 2012-10-21 NOTE — Progress Notes (Signed)
Patient ID: Christian Hartman, male   DOB: 1991-01-03, 22 y.o.   MRN: [id number] I spoke to the patient's mother and sister and explained the plan of care.  Patient has torn out his IV and is abusive to staff.  Will place in VAIL bed for patient safety.  Also will move to the front of the unit.  Security present and assisting. 25 more minutes critical care Patient examined and I agree with the assessment and plan  Violeta Gelinas, MD, MPH, FACS Pager: [phone]  10/21/2012 8:44 AM

## 2012-10-21 NOTE — Evaluation (Signed)
Speech Language Pathology Evaluation Patient Details Name: Christian Hartman MRN: [id number] DOB: [birth year] Today's Date: 10/21/2012 Time: 1445-1500 SLP Time Calculation (min): 15 min  Problem List:  Patient Active Problem List   Diagnosis Date Noted  . Fall from skateboard 10/21/2012  . Concussion 10/21/2012  . Acute respiratory failure 10/21/2012  . 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 Asthma    Past Surgical History: History reviewed. No pertinent past surgical history. HPI:  22 yo bm admitted s/p fall from skateboard.  By report he got up and was alert and then had a seizure. Question of some posturing. Admitted with GCS of 3. Intubated after arrival to ED. Pot found in his backpack. Extubated 5/19. Has been agitated requiring large amounts of sedation as well as nauseous requiring additional meds.    Assessment / Plan / Recommendation Clinical Impression  Cognitive-linguistic evaluation complete. Patient presenting with behaviors most consistent with a Rancho Level VI (confused, appropriate) characterized by impaired orientation to situation, decreased sustained attention, awareness, problem solving, judgement, and short term recall of information. Patient additionally very lethargic which may be contributing to presentation as well as is postictal and has recieved sedatives and anti-nasuea meds since admission. SLP will continue to f/u at bedside. Prognosis for improvement good given CT scan results and age.     SLP Assessment  Patient needs continued Speech Lanaguage Pathology Services    Follow Up Recommendations   (TBD pending progress)    Frequency and Duration min 2x/week  1 week   Pertinent Vitals/Pain none   SLP Goals  SLP Goals Potential to Achieve Goals: Good Progress/Goals/Alternative treatment plan discussed with pt/caregiver and they: Patient unable to parrticipate in goal setting SLP Goal #1:  Patient will sustain attention to functional task for 30 minutes with min cues to redirect SLP Goal #1 - Progress:  (new goal) SLP Goal #2: Patient will utilize external aids to facilitate short term recall of information with min cues.  SLP Goal #2 - Progress:  (new goal) SLP Goal #3: Patient will demonstrate anticipatory awareness of needs after d/c with mod assist.  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[employer])   Cognition  Overall Cognitive Status: Impaired/Different from baseline Arousal/Alertness: Lethargic Orientation Level: Oriented to person;Oriented to place;Oriented to time;Disoriented to situation Attention: Sustained Sustained Attention: Impaired Sustained Attention Impairment: Verbal basic;Functional basic Memory: Impaired Memory Impairment: Storage deficit;Retrieval deficit;Decreased recall of new information;Decreased short term memory Decreased Short Term Memory: Verbal basic;Functional basic Awareness: Impaired Awareness Impairment: Intellectual impairment Problem Solving: Impaired Problem Solving Impairment: Functional basic Behaviors: Impulsive Safety/Judgment: Impaired Comments: decreased safety awareness Rancho Mirant Scales of Cognitive Functioning: Confused/appropriate    Comprehension  Auditory Comprehension Overall Auditory Comprehension: Appears within functional limits for tasks assessed Visual Recognition/Discrimination Discrimination: Within Function Limits Reading Comprehension Reading Status: Within funtional limits    Expression Expression Primary Mode of Expression: Verbal Verbal Expression Overall Verbal Expression: Appears within functional limits for tasks assessed Written Expression Dominant Hand: Right   Oral /  Motor Oral Motor/Sensory Function Overall Oral Motor/Sensory Function: Appears within functional limits for tasks assessed Motor Speech Overall Motor Speech: Appears within functional limits for tasks assessed   GO   Christian Lango MA, CCC-SLP [phone]   Christian Hartman  Christian Hartman 10/21/2012, 4:22 PM

## 2012-10-21 NOTE — Progress Notes (Signed)
SLP Cancellation Note  Patient Details Name: Christian Hartman MRN: [id number] DOB: 12/26/1990   Cancelled treatment:        Patient currently with episodes of emesis. Will f/u 5/20.   Ferdinand Lango MA, CCC-SLP [phone]    Ferdinand Lango Meryl 10/21/2012, 2:19 PM

## 2012-10-21 NOTE — Progress Notes (Signed)
Nursing [phone] Patient vomitted with family at bedside. Patient more cooperative at this time..  Dr. Janee Morn made aware.  RN will attempt to place PIV with patient's cooperation. RN placed PIV and restarted IVFs and administered Zofran IV per MD.  Will not place patient in vail bed at this time due to patient cooperating, MD aware.  Patient moved to room 3102 at 1000.  Will continue to assess.[name]

## 2012-10-21 NOTE — Progress Notes (Signed)
Nursing 1400 Patient continuing to have N/V. Dr. Janee Morn made aware and to bedside to assess.  Order for Compazine IV.  Will continue to assess.

## 2012-10-21 NOTE — Clinical Social Work Note (Signed)
Clinical Social Worker spoke with patient mother briefly to explain CSW role and offer support.  Patient mom states that patient currently lives with his family and will return with support at discharge.  CSW to follow with full assessment once patient is able to fully participate and engage in the process.  CSW available for support as needed.  [name], [place] [phone]

## 2012-10-21 NOTE — Evaluation (Signed)
Physical Therapy Evaluation Patient Details Name: Christian Hartman MRN: [id number] DOB: 12/12/1990 Today's Date: 10/21/2012 Time: 1440-1500 PT Time Calculation (min): 20 min  PT Assessment / Plan / Recommendation Clinical Impression  22 yo bm admitted s/p fall from skateboard.  By report he got up and was alert and then had a seizure. Question of some posturing. Admitted with GCS of 3. Intubated after arrival to ED. Pot found in his backpack. Extubated 5/19. Has been agitated requiring large amounts of sedation as well as nauseous requiring additional meds.  He presents today with GCS level VI (confused, appropriate,) with decreased  orientation to situation, decreased short term memory, impaired awareness and attention, decreased balance and c/o dizziness.  He will benefit from skilled PT in the acute setting to address these issues and allow return home with family supervision.    PT Assessment  Patient needs continued PT services    Follow Up Recommendations  Supervision/Assistance - 24 hour;Outpatient PT (depending on progress)       Barriers to Discharge None      Equipment Recommendations  None recommended by PT       Frequency Min 3X/week    Precautions / Restrictions Precautions Precautions: Fall Restrictions Weight Bearing Restrictions: No   Pertinent Vitals/Pain Min c/o headache      Mobility  Bed Mobility Bed Mobility: Supine to Sit;Sitting - Scoot to [place] of Bed;Sit to Supine Supine to Sit: HOB flat;3: Mod assist Sitting - Scoot to Edge of Bed: 4: Min guard Sit to Supine: 5: Supervision Details for Bed Mobility Assistance: Pt required assist to move to EOB due to lethargy and not wanting to participate. Pt's LEs were moved off the bed and he grabbed to therapist's hand to assit with lifiting torso Transfers Sit to Stand: 4: Min assist;With upper extremity assist;From bed Stand to Sit: 4: Min assist;With upper extremity assist;To bed Transfer via Lift Equipment:  Stedy Details for Transfer Assistance: Pt complaint of dizziness Ambulation/Gait Ambulation/Gait Assistance: 4: Min Environmental consultant (Feet): 150 Feet Assistive device: 1 person hand held assist Ambulation/Gait Assistance Details: grossly WFL, able to navigate around unit and back to room with questioning cues Gait Pattern: Step-through pattern Gait velocity: WFL for community mobility        PT Diagnosis: Abnormality of gait  PT Problem List: Decreased activity tolerance;Decreased balance;Decreased mobility;Decreased safety awareness;Decreased cognition PT Treatment Interventions: Gait training;Balance training;Neuromuscular re-education;Stair training;Cognitive remediation;Therapeutic activities;Patient/family education   PT Goals Acute Rehab PT Goals PT Goal Formulation: With patient/family Time For Goal Achievement: 10/28/12 Potential to Achieve Goals: Good Pt will go Sit to Stand: Independently PT Goal: Sit to Stand - Progress: Goal set today Pt will Ambulate: >150 feet;Independently PT Goal: Ambulate - Progress: Goal set today Pt will Go Up / Down Stairs: 3-5 stairs;with modified independence;with rail(s) PT Goal: Up/Down Stairs - Progress: Goal set today Additional Goals Additional Goal #1: Dynamic Gait Index at least 20/24 for safe community ambulation. PT Goal: Additional Goal #1 - Progress: Goal set today  Visit Information  Last PT Received On: 10/21/12 Assistance Needed: +2 (for safety) PT/OT Co-Evaluation/Treatment: Yes    Subjective Data  Subjective: I will get up if you will leave me alone. 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[employer]) Comments: Pt works as a Conservation officer, nature at [employer], 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 Communication Communication: No difficulties Dominant Hand: Right    Cognition  Cognition Arousal/Alertness: Lethargic Behavior During Therapy:  (lethargic) Overall Cognitive Status: Impaired/Different from baseline Area of Impairment: Orientation;Memory;Attention;Following commands;Safety/judgement;Awareness Orientation Level: Situation;Time (Did not know the date nor situation despite being told) Current Attention Level: Sustained Memory: Decreased short-term memory Following Commands: Follows one step commands consistently Safety/Judgement: Decreased awareness of deficits General Comments: Pt lethargic - has had many meds during the day which could be contributing to lethargy.  Pt required max cues and encouragement to arrouse adequately to participate.  Pt was unable to recall accident or reason for hospitalization despite being told.  He was able to use calendar to orient to date, and was able to find room when ambulating on unit.    Rancho Levels of Cognitive Functioning Rancho Los Amigos Scales of Cognitive Functioning: Confused/appropriate    Extremity/Trunk Assessment Right Upper Extremity Assessment RUE ROM/Strength/Tone: Within functional levels RUE Coordination: WFL - gross/fine motor Left Upper Extremity Assessment LUE ROM/Strength/Tone: Within functional levels LUE Coordination: WFL - gross/fine motor Right Lower Extremity Assessment RLE ROM/Strength/Tone: WFL for tasks assessed RLE Sensation: WFL - Light Touch Left Lower Extremity Assessment LLE ROM/Strength/Tone: WFL for tasks assessed LLE Sensation: WFL - Light  Touch Trunk Assessment Trunk Assessment: Normal   Balance Balance Balance Assessed: Yes Static Sitting Balance Static Sitting - Balance Support: Feet supported;Bilateral upper extremity supported Static Sitting - Level of Assistance: 5: Stand by assistance Static Sitting - Comment/# of Minutes: for safety due to c/o dizzy  End of Session PT - End of Session Activity Tolerance: Patient limited by fatigue (wanting to return to bed due to sleepy; mult meds) Patient left: in bed;with family/visitor present;with bed alarm set  GP     [facility] 10/21/2012, 5:02 PM [place], [place] [phone] 10/21/2012

## 2012-10-21 NOTE — Progress Notes (Signed)
UR completed 

## 2012-10-21 NOTE — Progress Notes (Signed)
Chaplain followed up on referral from overnight chaplain. Pt age 22 fell from skateboard and hit his head. Pt was dozing during this visit. Chaplain visited with pt's aunt and two cousins. Pt's aunt is greatly encouraged that pt is much improved today over yesterday.

## 2012-10-21 NOTE — Procedures (Signed)
Extubation Procedure Note  Patient Details:   Name: Christian Hartman DOB: 1991-01-20 MRN: [id number]   Airway Documentation:     Evaluation  O2 sats: stable throughout Complications: No apparent complications Patient did tolerate procedure well. Bilateral Breath Sounds: Clear Suctioning: Oral Yes  Pt placed on SBT 5/5 per protocol. Pt very agitated, only weaned about 5 min. Pt extubated per MD order. No dypsnea or stridor noted after extubation. All vitals are stable at this time. RT will continue to monitor.  Beatris Si 10/21/2012, 8:27 AM

## 2012-10-21 NOTE — Progress Notes (Signed)
Patient ID: Christian Hartman, male   DOB: 17-Jun-1990, 22 y.o.   MRN: [id number] Notified by RN of emesis again.  PERL, EOMI, GCS 15, MAE. More calm.  Will add compazine. Violeta Gelinas, MD, MPH, FACS Pager: [phone]

## 2012-10-21 NOTE — Progress Notes (Signed)
Patient ID: Christian Hartman, male   DOB: 05/30/1991, 22 y.o.   MRN: [id number] Follow up - Trauma Critical Care  Patient Details:    Christian Hartman is an 22 y.o. male.  Lines/tubes : Airway 7.5 mm (Active)  Secured at (cm) 23 cm 10/21/2012  3:36 AM  Measured From Lips 10/21/2012  3:36 AM  Secured Location Center 10/20/2012  9:14 PM  Secured By [employer] 10/21/2012  3:36 AM  Tube Holder Repositioned Yes 10/21/2012  3:36 AM  Cuff Pressure (cm H2O) 24 cm H2O 10/21/2012 12:20 AM  Site Condition Dry 10/20/2012  9:14 PM     NG/OG Tube Orogastric 14 Fr. Center mouth (Active)  Placement Verification Auscultation 10/20/2012 10:00 PM  Site Assessment Clean;Dry;Intact 10/21/2012  7:30 AM  Status Suction-low intermittent 10/21/2012  7:30 AM  Drainage Appearance Brown 10/21/2012  7:30 AM  Output (mL) 500 mL 10/21/2012  6:00 AM     Urethral Catheter Temperature probe 14 Fr. (Active)  Indication for Insertion or Continuance of Catheter Urinary output monitoring 10/21/2012  7:30 AM  Site Assessment Clean;Intact 10/21/2012  7:30 AM  Collection Container Standard drainage bag 10/21/2012  7:30 AM  Securement Method Leg strap 10/21/2012  7:30 AM  Urinary Catheter Interventions Unclamped 10/20/2012 10:00 PM  Output (mL) 90 mL 10/21/2012  6:00 AM    Microbiology/Sepsis markers: Results for orders placed during the hospital encounter of 10/20/12  MRSA PCR SCREENING     Status: None   Collection Time    10/20/12  9:29 PM      Result Value Range Status   MRSA by PCR NEGATIVE  NEGATIVE Final   Comment:            The GeneXpert MRSA Assay (FDA     approved for NASAL specimens     only), is one component of a     comprehensive MRSA colonization     surveillance program. It is not     intended to diagnose MRSA     infection nor to guide or     monitor treatment for     MRSA infections.    Anti-infectives:  Anti-infectives   None      Best Practice/Protocols:  VTE Prophylaxis:  Mechanical Intermittent Sedation  Consults: Treatment Team:  Kym Groom, MD    Studies:    Events:  Subjective:    Overnight Issues:   Objective:  Vital signs for last 24 hours: Temp:  [97.7 F (36.5 C)-100.1 F (37.8 C)] 100.1 F (37.8 C) (05/19 0600) Pulse Rate:  [50-101] 84 (05/19 0700) Resp:  [14-27] 19 (05/19 0700) BP: (117-178)/(56-85) 133/77 mmHg (05/19 0700) SpO2:  [100 %] 100 % (05/19 0700) FiO2 (%):  [30 %-100 %] 30 % (05/19 0700) Weight:  [71.94 kg (158 lb 9.6 oz)-76 kg (167 lb 8.8 oz)] 71.94 kg (158 lb 9.6 oz) (05/18 2200)  Hemodynamic parameters for last 24 hours:    Intake/Output from previous day: 05/18 0701 - 05/19 0700 In: 1020 [I.V.:1020] Out: 1270 [Urine:770; Emesis/NG output:500]  Intake/Output this shift:    Vent settings for last 24 hours: Vent Mode:  [-] PRVC FiO2 (%):  [30 %-100 %] 30 % Set Rate:  [16 bmp] 16 bmp Vt Set:  [450 mL] 450 mL PEEP:  [5 cmH20] 5 cmH20 Plateau Pressure:  [11 cmH20-15 cmH20] 15 cmH20  Physical Exam:  General: on vent Neuro: PERL, MAE purposefully, speech fluent after extubation HEENT/Neck: R posterior scalp hematoma Resp: clear to auscultation bilaterally CVS:  RRR GI: Soft, NT, ND Extremities: R posterior shoulder abrasion  Results for orders placed during the hospital encounter of 10/20/12 (from the past 24 hour(s))  TYPE AND SCREEN     Status: None   Collection Time    10/20/12  7:35 PM      Result Value Range   ABO/RH(D) O NEG     Antibody Screen NEG     Sample Expiration 10/23/2012     Unit Number [id number]     Blood Component Type RED CELLS,LR     Unit division 00     Status of Unit REL FROM [facility]     Unit tag comment VERBAL ORDERS PER DR GHIM     Transfusion Status OK TO TRANSFUSE     Crossmatch Result NOT NEEDED     Unit Number [id number]     Blood Component Type RED CELLS,LR     Unit division 00     Status of Unit REL FROM [facility]     Unit tag comment VERBAL ORDERS PER DR  GHIM     Transfusion Status OK TO TRANSFUSE     Crossmatch Result NOT NEEDED    ABO/RH     Status: None   Collection Time    10/20/12  7:35 PM      Result Value Range   ABO/RH(D) O NEG    POCT I-STAT, CHEM 8     Status: Abnormal   Collection Time    10/20/12  7:47 PM      Result Value Range   Sodium 143  135 - 145 mEq/L   Potassium 3.8  3.5 - 5.1 mEq/L   Chloride 110  96 - 112 mEq/L   BUN 23  6 - 23 mg/dL   Creatinine, Ser 3.08  0.50 - 1.35 mg/dL   Glucose, Bld 657 (*) 70 - 99 mg/dL   Calcium, Ion 8.46  9.62 - 1.23 mmol/L   TCO2 20  0 - 100 mmol/L   Hemoglobin 16.0  13.0 - 17.0 g/dL   HCT 95.2  84.1 - 32.4 %  CBC WITH DIFFERENTIAL     Status: Abnormal   Collection Time    10/20/12  7:49 PM      Result Value Range   WBC 16.1 (*) 4.0 - 10.5 K/uL   RBC 4.43  4.22 - 5.81 MIL/uL   Hemoglobin 14.0  13.0 - 17.0 g/dL   HCT 40.1  02.7 - 25.3 %   MCV 96.6  78.0 - 100.0 fL   MCH 31.6  26.0 - 34.0 pg   MCHC 32.7  30.0 - 36.0 g/dL   RDW 66.4  40.3 - 47.4 %   Platelets 259  150 - 400 K/uL   Neutrophils Relative % 34 (*) 43 - 77 %   Lymphocytes Relative 54 (*) 12 - 46 %   Monocytes Relative 8  3 - 12 %   Eosinophils Relative 4  0 - 5 %   Basophils Relative 0  0 - 1 %   Neutro Abs 5.5  1.7 - 7.7 K/uL   Lymphs Abs 8.7 (*) 0.7 - 4.0 K/uL   Monocytes Absolute 1.3 (*) 0.1 - 1.0 K/uL   Eosinophils Absolute 0.6  0.0 - 0.7 K/uL   Basophils Absolute 0.0  0.0 - 0.1 K/uL   RBC Morphology BURR CELLS    COMPREHENSIVE METABOLIC PANEL     Status: Abnormal   Collection Time    10/20/12  7:49 PM  Result Value Range   Sodium 143  135 - 145 mEq/L   Potassium 3.9  3.5 - 5.1 mEq/L   Chloride 104  96 - 112 mEq/L   CO2 18 (*) 19 - 32 mEq/L   Glucose, Bld 140 (*) 70 - 99 mg/dL   BUN 20  6 - 23 mg/dL   Creatinine, Ser 1.61 (*) 0.50 - 1.35 mg/dL   Calcium 9.5  8.4 - 09.6 mg/dL   Total Protein 8.2  6.0 - 8.3 g/dL   Albumin 4.8  3.5 - 5.2 g/dL   AST 30  0 - 37 U/L   ALT 17  0 - 53 U/L    Alkaline Phosphatase 74  39 - 117 U/L   Total Bilirubin 0.7  0.3 - 1.2 mg/dL   GFR calc non Af Amer 70 (*) >90 mL/min   GFR calc Af Amer 81 (*) >90 mL/min  ETHANOL     Status: None   Collection Time    10/20/12  7:49 PM      Result Value Range   Alcohol, Ethyl (B) <11  0 - 11 mg/dL  CG4 I-STAT (LACTIC ACID)     Status: Abnormal   Collection Time    10/20/12  8:04 PM      Result Value Range   Lactic Acid, Venous 12.04 (*) 0.5 - 2.2 mmol/L  URINALYSIS, ROUTINE W REFLEX MICROSCOPIC     Status: Abnormal   Collection Time    10/20/12  8:27 PM      Result Value Range   Color, Urine YELLOW  YELLOW   APPearance CLEAR  CLEAR   Specific Gravity, Urine 1.039 (*) 1.005 - 1.030   pH 5.0  5.0 - 8.0   Glucose, UA NEGATIVE  NEGATIVE mg/dL   Hgb urine dipstick MODERATE (*) NEGATIVE   Bilirubin Urine NEGATIVE  NEGATIVE   Ketones, ur NEGATIVE  NEGATIVE mg/dL   Protein, ur 30 (*) NEGATIVE mg/dL   Urobilinogen, UA 0.2  0.0 - 1.0 mg/dL   Nitrite NEGATIVE  NEGATIVE   Leukocytes, UA NEGATIVE  NEGATIVE  URINE MICROSCOPIC-ADD ON     Status: Abnormal   Collection Time    10/20/12  8:27 PM      Result Value Range   WBC, UA 0-2  <3 WBC/hpf   RBC / HPF 0-2  <3 RBC/hpf   Casts HYALINE CASTS (*) NEGATIVE   Urine-Other AMORPHOUS URATES/PHOSPHATES    URINE RAPID DRUG SCREEN (HOSP PERFORMED)     Status: Abnormal   Collection Time    10/20/12  8:28 PM      Result Value Range   Opiates NONE DETECTED  NONE DETECTED   Cocaine NONE DETECTED  NONE DETECTED   Benzodiazepines POSITIVE (*) NONE DETECTED   Amphetamines NONE DETECTED  NONE DETECTED   Tetrahydrocannabinol POSITIVE (*) NONE DETECTED   Barbiturates NONE DETECTED  NONE DETECTED  POCT I-STAT 3, BLOOD GAS (G3+)     Status: Abnormal   Collection Time    10/20/12  8:38 PM      Result Value Range   pH, Arterial 7.322 (*) 7.350 - 7.450   pCO2 arterial 41.4  35.0 - 45.0 mmHg   pO2, Arterial 416.0 (*) 80.0 - 100.0 mmHg   Bicarbonate 21.4  20.0 - 24.0  mEq/L   TCO2 23  0 - 100 mmol/L   O2 Saturation 100.0     Acid-base deficit 4.0 (*) 0.0 - 2.0 mmol/L   Patient temperature 98.6 F  Collection site RADIAL, ALLEN'S TEST ACCEPTABLE     Drawn by RT     Sample type ARTERIAL    MRSA PCR SCREENING     Status: None   Collection Time    10/20/12  9:29 PM      Result Value Range   MRSA by PCR NEGATIVE  NEGATIVE  CBC     Status: Abnormal   Collection Time    10/21/12  5:24 AM      Result Value Range   WBC 16.6 (*) 4.0 - 10.5 K/uL   RBC 4.21 (*) 4.22 - 5.81 MIL/uL   Hemoglobin 13.2  13.0 - 17.0 g/dL   HCT 16.1 (*) 09.6 - 04.5 %   MCV 92.2  78.0 - 100.0 fL   MCH 31.4  26.0 - 34.0 pg   MCHC 34.0  30.0 - 36.0 g/dL   RDW 40.9  81.1 - 91.4 %   Platelets 205  150 - 400 K/uL  BASIC METABOLIC PANEL     Status: Abnormal   Collection Time    10/21/12  5:24 AM      Result Value Range   Sodium 137  135 - 145 mEq/L   Potassium 3.9  3.5 - 5.1 mEq/L   Chloride 104  96 - 112 mEq/L   CO2 24  19 - 32 mEq/L   Glucose, Bld 107 (*) 70 - 99 mg/dL   BUN 13  6 - 23 mg/dL   Creatinine, Ser 7.82  0.50 - 1.35 mg/dL   Calcium 9.4  8.4 - 95.6 mg/dL   GFR calc non Af Amer 85 (*) >90 mL/min   GFR calc Af Amer >90  >90 mL/min    Assessment & Plan: Present on Admission:  . Generalized seizure   LOS: 1 day   Additional comments:I reviewed the patient's new clinical lab test results. . Skateboard crash VDRF - extubate now TBI/concussion - appreciate neurology input, no further SZ, no antiepileptics at this time, TBI team therapies R posterior scalp hematoma R shoulder abrasion FEN - try clears C-spine - CT neg will try to clear clinically Continue 3100 Critical Care Total Time*: 45 Minutes  Violeta Gelinas, MD, MPH, FACS Pager: [phone]  10/21/2012  *Care during the described time interval was provided by me and/or other providers on the critical care team.  I have reviewed this patient's available data, including medical history, events of  note, physical examination and test results as part of my evaluation.

## 2012-10-22 ENCOUNTER — Inpatient Hospital Stay (HOSPITAL_COMMUNITY): Payer: PRIVATE HEALTH INSURANCE

## 2012-10-22 DIAGNOSIS — Z5189 Encounter for other specified aftercare: Secondary | ICD-10-CM

## 2012-10-22 DIAGNOSIS — R4182 Altered mental status, unspecified: Secondary | ICD-10-CM

## 2012-10-22 DIAGNOSIS — R561 Post traumatic seizures: Secondary | ICD-10-CM

## 2012-10-22 DIAGNOSIS — S0291XA Unspecified fracture of skull, initial encounter for closed fracture: Secondary | ICD-10-CM

## 2012-10-22 DIAGNOSIS — S065X9A Traumatic subdural hemorrhage with loss of consciousness of unspecified duration, initial encounter: Secondary | ICD-10-CM

## 2012-10-22 LAB — BASIC METABOLIC PANEL
BUN: 11 mg/dL (ref 6–23)
CO2: 22 mEq/L (ref 19–32)
Chloride: 106 mEq/L (ref 96–112)
Creatinine, Ser: 1.17 mg/dL (ref 0.50–1.35)

## 2012-10-22 MED ORDER — ENOXAPARIN SODIUM 40 MG/0.4ML ~~LOC~~ SOLN
40.0000 mg | SUBCUTANEOUS | Status: DC
  Filled 2012-10-22: qty 0.4

## 2012-10-22 NOTE — Progress Notes (Signed)
Occupational Therapy Treatment Patient Details Name: Christian Hartman MRN: [id number] DOB: 05/09/1991 Today's Date: 10/22/2012 Time: 1478-2956 OT Time Calculation (min): 19 min  OT Assessment / Plan / Recommendation Comments on Treatment Session  Pt very lethargic today, unable to move to EOB due to c/o severe dizziness, but pt too lethargic to elaborate on symptoms.  Pt continues to be disoriented to situation.  Also noted to close Lt. Eye when reading clock on wall, but unable to state why, or clarify if he is experiencing diplopia or blurriness. Pt participated in very brief visual assesment - mild nystagmus noted with pursuits to right, but pt unable to participate in further asessment.   RN present during treatment.     Follow Up Recommendations  Supervision/Assistance - 24 hour;Outpatient OT    Barriers to Discharge       Equipment Recommendations  None recommended by OT    Recommendations for Other Services    Frequency Min 2X/week   Plan Discharge plan remains appropriate (will monitor for progress)    Precautions / Restrictions Precautions Precautions: Fall Restrictions Weight Bearing Restrictions: No   Pertinent Vitals/Pain HR 38-48    ADL  Transfers/Ambulation Related to ADLs: unable to sit up due dizziness ADL Comments: Unable to engage pt in activity due to dizziness and lethargy.  Pt required max cues to attempt to move EOB due to being lethargic, On first attempt, pt. sat partially up, then immediately returned to supine stating he is dizzy, but was unable to elaborate re: symptoms.  He was able to read clock on wall accurately, but noted to close left eye to do so.  When asked if vision was blurry or double, he stated "I don't know".  Pt briefly participated in occulomotor pursuits - tracked to Rt. with minimal horizontal nystagmus noted, but unable to replicate or further test due to lethargy.  HOB moved to ~50*, and tried a second time to move pt to EOB, but again  complaint of dizziness and immediately returned to supine.      OT Diagnosis:    OT Problem List:   OT Treatment Interventions:     OT Goals Acute Rehab OT Goals Time For Goal Achievement: 10/28/12 Potential to Achieve Goals: Good ADL Goals Pt Will Perform Grooming: with modified independence;Standing at sink ADL Goal: Grooming - Progress: Not progressing Pt Will Perform Upper Body Bathing: with modified independence;Sitting, chair;Sitting, edge of bed ADL Goal: Upper Body Bathing - Progress: Not progressing Pt Will Perform Upper Body Dressing: with modified independence;Sitting, chair;Sitting, bed ADL Goal: Upper Body Dressing - Progress: Not progressing Pt Will Perform Lower Body Dressing: with modified independence;Sit to stand from chair;Sit to stand from bed ADL Goal: Lower Body Dressing - Progress: Not progressing Pt Will Transfer to Toilet: with modified independence;Ambulation;Comfort height toilet ADL Goal: Toilet Transfer - Progress: Not progressing Pt Will Perform Toileting - Clothing Manipulation: with modified independence;Standing ADL Goal: Toileting - Clothing Manipulation - Progress: Not progressing Pt Will Perform Toileting - Hygiene: Independently;Sitting on 3-in-1 or toilet ADL Goal: Toileting - Hygiene - Progress: Not progressing Pt Will Perform Tub/Shower Transfer: with modified independence;Ambulation;Tub transfer Additional ADL Goal #1: Pt will perform path finding activity with no more than min cues ADL Goal: Additional Goal #1 - Progress: Not progressing Additional ADL Goal #2: Pt will participate in further visual assesment ADL Goal: Additional Goal #2 - Progress: Not progressing  Visit Information  Last OT Received On: 10/22/12 Assistance Needed: +2 (safety)    Subjective Data  Prior Functioning       Cognition  Cognition Arousal/Alertness: Lethargic Behavior During Therapy:  (lethargic) Overall Cognitive Status: Impaired/Different from  baseline Area of Impairment: Orientation;Attention;Memory;Following commands;Awareness;Problem solving Orientation Level: Time;Situation Current Attention Level: Focused;Sustained (mostly focused, brief period sustained x 10 seconds) Memory: Decreased short-term memory Following Commands: Follows one step commands inconsistently Safety/Judgement: Decreased awareness of deficits Problem Solving: Slow processing;Decreased initiation;Requires verbal cues;Requires tactile cues General Comments: Pt very lethargic today, difficulty maintaining adequate arrousal to participate.  Pt continues to state he is in hospital due to vomiting.   Rancho Levels of Cognitive Functioning Rancho Los Amigos Scales of Cognitive Functioning: Confused/appropriate    Mobility  Bed Mobility Details for Bed Mobility Assistance: unable to moved to EOB due to dizziness.  Rolls Lt and Rt. independently Transfers Transfers: Not assessed    Exercises      Balance     End of Session OT - End of Session Activity Tolerance: Patient limited by fatigue Patient left: in bed;with call bell/phone within reach;with family/visitor present (sitter present) Nurse Communication: Other (comment) (RN present during treatment - aware of changes)  GO     Alaynna Kerwood M 10/22/2012, 11:45 AM

## 2012-10-22 NOTE — Progress Notes (Signed)
EEG completed.

## 2012-10-22 NOTE — Progress Notes (Signed)
Subjective: Patient extubated.  Sleeping but able to be awakened.  Follows commands.  No further seizure activity.  Has required a sitter.    Objective: Current vital signs: BP 127/65  Pulse 51  Temp(Src) 99 F (37.2 C) (Oral)  Resp 14  Ht 6\' 1"  (1.854 m)  Wt 71.94 kg (158 lb 9.6 oz)  BMI 20.93 kg/m2  SpO2 100% Vital signs in last 24 hours: Temp:  [98 F (36.7 C)-99.3 F (37.4 C)] 99 F (37.2 C) (05/20 0744) Pulse Rate:  [32-55] 51 (05/20 0300) Resp:  [14-22] 14 (05/20 0600) BP: (109-134)/(48-70) 127/65 mmHg (05/20 0600) SpO2:  [99 %-100 %] 100 % (05/20 0300)  Intake/Output from previous day: 05/19 0701 - 05/20 0700 In: 2200 [I.V.:2200] Out: 629 [Urine:625; Emesis/NG output:4] Intake/Output this shift:   Nutritional status: Clear Liquid  Neurologic Exam: Mental Status: Lethargic.  Speech minimal.  Follows commands.   Cranial Nerves: II: Discs flat bilaterally; Visual fields grossly normal, pupils equal, round, reactive to light and accommodation III,IV, VI: ptosis not present, extra-ocular motions intact bilaterally V,VII: smile symmetric, facial light touch sensation normal bilaterally VIII: hearing normal bilaterally IX,X: gag reflex present XI: bilateral shoulder shrug Motor: Right : Upper extremity   5/5    Left:     Upper extremity   5/5  Lower extremity   5/5     Lower extremity   5/5 Tone and bulk:normal tone throughout; no atrophy noted Sensory: Pinprick and light touch intact throughout, bilaterally Deep Tendon Reflexes: 2+ and symmetric throughout Plantars: Right: downgoing   Left: downgoing  Lab Results: Basic Metabolic Panel:  Recent Labs Lab 10/20/12 1947 10/20/12 1949 10/21/12 0524 10/22/12 0400  NA 143 143 137 140  K 3.8 3.9 3.9 4.3  CL 110 104 104 106  CO2  --  18* 24 22  GLUCOSE 132* 140* 107* 95  BUN 23 20 13 11   CREATININE 1.20 1.41* 1.20 1.17  CALCIUM  --  9.5 9.4 9.5    Liver Function Tests:  Recent Labs Lab 10/20/12 1949   AST 30  ALT 17  ALKPHOS 74  BILITOT 0.7  PROT 8.2  ALBUMIN 4.8   No results found for this basename: LIPASE, AMYLASE,  in the last 168 hours No results found for this basename: AMMONIA,  in the last 168 hours  CBC:  Recent Labs Lab 10/20/12 1947 10/20/12 1949 10/21/12 0524  WBC  --  16.1* 16.6*  NEUTROABS  --  5.5  --   HGB 16.0 14.0 13.2  HCT 47.0 42.8 38.8*  MCV  --  96.6 92.2  PLT  --  259 205    Cardiac Enzymes: No results found for this basename: CKTOTAL, CKMB, CKMBINDEX, TROPONINI,  in the last 168 hours  Lipid Panel: No results found for this basename: CHOL, TRIG, HDL, CHOLHDL, VLDL, LDLCALC,  in the last 168 hours  CBG: No results found for this basename: GLUCAP,  in the last 168 hours  Microbiology: Results for orders placed during the hospital encounter of 10/20/12  MRSA PCR SCREENING     Status: None   Collection Time    10/20/12  9:29 PM      Result Value Range Status   MRSA by PCR NEGATIVE  NEGATIVE Final   Comment:            The GeneXpert MRSA Assay (FDA     approved for NASAL specimens     only), is one component of a  comprehensive MRSA colonization     surveillance program. It is not     intended to diagnose MRSA     infection nor to guide or     monitor treatment for     MRSA infections.    Coagulation Studies: No results found for this basename: LABPROT, INR,  in the last 72 hours  Imaging: Ct Head Wo Contrast  10/20/2012   *RADIOLOGY REPORT*  Clinical Data:  Level I trauma.  Skateboarding head injury. Unconscious.  CT HEAD WITHOUT CONTRAST CT CERVICAL SPINE WITHOUT CONTRAST  Technique:  Multidetector CT imaging of the head and cervical spine was performed following the standard protocol without intravenous contrast.  Multiplanar CT image reconstructions of the cervical spine were also generated.  Comparison:   None  CT HEAD  Findings: There is no evidence of intracranial hemorrhage, brain edema or other signs of acute infarction.   There is no evidence of intracranial mass lesion or mass effect.  No abnormal extra-axial fluid collections are identified.  Ventricles are normal in size.  No evidence of skull fracture or pneumocephalus.  Right posterior parietal scalp hematoma noted.  IMPRESSION: Right posterior parietal scalp hematoma.  No evidence of skull fracture or intracranial abnormality.  CT CERVICAL SPINE  Findings: No evidence of cervical spine fracture or subluxation. Intervertebral disc spaces are maintained.  No evidence of facet DJD or other bone abnormality.  IMPRESSION: Negative.  No evidence of cervical spine fracture or subluxation.   Original Report Authenticated By: Myles Rosenthal, M.D.   Ct Cervical Spine Wo Contrast  10/20/2012   *RADIOLOGY REPORT*  Clinical Data:  Level I trauma.  Skateboarding head injury. Unconscious.  CT HEAD WITHOUT CONTRAST CT CERVICAL SPINE WITHOUT CONTRAST  Technique:  Multidetector CT imaging of the head and cervical spine was performed following the standard protocol without intravenous contrast.  Multiplanar CT image reconstructions of the cervical spine were also generated.  Comparison:   None  CT HEAD  Findings: There is no evidence of intracranial hemorrhage, brain edema or other signs of acute infarction.  There is no evidence of intracranial mass lesion or mass effect.  No abnormal extra-axial fluid collections are identified.  Ventricles are normal in size.  No evidence of skull fracture or pneumocephalus.  Right posterior parietal scalp hematoma noted.  IMPRESSION: Right posterior parietal scalp hematoma.  No evidence of skull fracture or intracranial abnormality.  CT CERVICAL SPINE  Findings: No evidence of cervical spine fracture or subluxation. Intervertebral disc spaces are maintained.  No evidence of facet DJD or other bone abnormality.  IMPRESSION: Negative.  No evidence of cervical spine fracture or subluxation.   Original Report Authenticated By: Myles Rosenthal, M.D.   Ct Abdomen  Pelvis W Contrast  10/20/2012   *RADIOLOGY REPORT*  Clinical Data: Level I trauma.  Skateboarding accident.  Fall. Altered mental status.  CT ABDOMEN AND PELVIS WITH CONTRAST  Technique:  Multidetector CT imaging of the abdomen and pelvis was performed following the standard protocol during bolus administration of intravenous contrast.  Contrast: OMNIPAQUE IOHEXOL 300 MG/ML  SOLN  Comparison: None.  Findings: No evidence of lacerations or contusions involving the abdominal parenchymal organs.  No evidence of hemoperitoneum. Unopacified bowel is unremarkable in appearance.  No soft tissue masses or inflammatory process identified.  No other abnormal fluid collections are seen.  No evidence of fractures.  IMPRESSION: No evidence of visceral injury, hemoperitoneum, or other significant abnormality.   Original Report Authenticated By: Myles Rosenthal, M.D.  Dg Chest Port 1 View  10/21/2012   *RADIOLOGY REPORT*  Clinical Data: Assess endotracheal tube.  PORTABLE CHEST - 1 VIEW  Comparison: 10/20/2012.  Findings: The endotracheal tube is stable.  There is a new NG tube coursing down into the stomach.  The heart is not normal in size and the mediastinal and hilar contours are normal.  The lungs are clear.  IMPRESSION:  1.  Stable ET tube. 2.  New NG tube. 3.  No acute cardiopulmonary findings.   Original Report Authenticated By: Rudie Meyer, M.D.   Dg Chest Portable 1 View  10/20/2012   *RADIOLOGY REPORT*  Clinical Data: Level I trauma.  Head injury.  Unresponsive.  Acute respiratory failure.  Intubation.  PORTABLE CHEST - 1 VIEW  Comparison: None.  Findings: Endotracheal tube is seen in appropriate position with tip approximately 4 cm above the carina.  Both lungs are clear.  No evidence of pneumothorax or hemothorax.  Heart size is normal.  No evidence of abnormal mediastinal widening or tracheal deviation.  IMPRESSION: Endotracheal tube in appropriate position.  No acute findings.   Original Report  Authenticated By: Myles Rosenthal, M.D.    Medications:  I have reviewed the patient's current medications. Scheduled: . pantoprazole  40 mg Oral Daily   Or  . pantoprazole (PROTONIX) IV  40 mg Intravenous Daily  . pneumococcal 23 valent vaccine  0.5 mL Intramuscular Tomorrow-1000  . polymixin-bacitracin   Topical BID  . 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  No further seizures noted.  On no anticonvulsants.  With some lethargy that may be post-concussive in nature but there has been no repeat in imaging secondary to agitation.  Will hopefully be able to repeat that today to rule out other intracranial possible etiologies.    Recommendations: 1.  EEG pending 2.  Repeat head CT without contrast   LOS: 2 days   Thana Farr, MD [facility] [phone] 10/22/2012  8:11 AM

## 2012-10-22 NOTE — Progress Notes (Addendum)
Patient ID: Christian Hartman, male   DOB: August 22, 1990, 22 y.o.   MRN: [id number]    Subjective: No new complaint, denies nausea or emesis during the   Objective: Vital signs in last 24 hours: Temp:  [98 F (36.7 C)-99.9 F (37.7 C)] 99 F (37.2 C) (05/20 0744) Pulse Rate:  [32-113] 51 (05/20 0300) Resp:  [14-25] 14 (05/20 0600) BP: (109-139)/(48-77) 127/65 mmHg (05/20 0600) SpO2:  [99 %-100 %] 100 % (05/20 0300)    Intake/Output from previous day: 05/19 0701 - 05/20 0700 In: 2200 [I.V.:2200] Out: 629 [Urine:625; Emesis/NG output:4] Intake/Output this shift:    General appearance: cooperative Neck: no posterior midline tenderness Back: upper back, R posterior shoulder abrasions Cardio: regular rate and rhythm GI: soft, NT, ND, +BS Extremities: no edema Neuro: PERl, arouses and briskly F/C, GCS 15, MAE  Lab Results: CBC   Recent Labs  10/20/12 1949 10/21/12 0524  WBC 16.1* 16.6*  HGB 14.0 13.2  HCT 42.8 38.8*  PLT 259 205   BMET  Recent Labs  10/21/12 0524 10/22/12 0400  NA 137 140  K 3.9 4.3  CL 104 106  CO2 24 22  GLUCOSE 107* 95  BUN 13 11  CREATININE 1.20 1.17  CALCIUM 9.4 9.5   PT/INR No results found for this basename: LABPROT, INR,  in the last 72 hours ABG  Recent Labs  10/20/12 2038  PHART 7.322*  HCO3 21.4    Studies/Results: Ct Head Wo Contrast  10/20/2012   *RADIOLOGY REPORT*  Clinical Data:  Level I trauma.  Skateboarding head injury. Unconscious.  CT HEAD WITHOUT CONTRAST CT CERVICAL SPINE WITHOUT CONTRAST  Technique:  Multidetector CT imaging of the head and cervical spine was performed following the standard protocol without intravenous contrast.  Multiplanar CT image reconstructions of the cervical spine were also generated.  Comparison:   None  CT HEAD  Findings: There is no evidence of intracranial hemorrhage, brain edema or other signs of acute infarction.  There is no evidence of intracranial mass lesion or mass effect.  No abnormal  extra-axial fluid collections are identified.  Ventricles are normal in size.  No evidence of skull fracture or pneumocephalus.  Right posterior parietal scalp hematoma noted.  IMPRESSION: Right posterior parietal scalp hematoma.  No evidence of skull fracture or intracranial abnormality.  CT CERVICAL SPINE  Findings: No evidence of cervical spine fracture or subluxation. Intervertebral disc spaces are maintained.  No evidence of facet DJD or other bone abnormality.  IMPRESSION: Negative.  No evidence of cervical spine fracture or subluxation.   Original Report Authenticated By: Myles Rosenthal, M.D.   Ct Cervical Spine Wo Contrast  10/20/2012   *RADIOLOGY REPORT*  Clinical Data:  Level I trauma.  Skateboarding head injury. Unconscious.  CT HEAD WITHOUT CONTRAST CT CERVICAL SPINE WITHOUT CONTRAST  Technique:  Multidetector CT imaging of the head and cervical spine was performed following the standard protocol without intravenous contrast.  Multiplanar CT image reconstructions of the cervical spine were also generated.  Comparison:   None  CT HEAD  Findings: There is no evidence of intracranial hemorrhage, brain edema or other signs of acute infarction.  There is no evidence of intracranial mass lesion or mass effect.  No abnormal extra-axial fluid collections are identified.  Ventricles are normal in size.  No evidence of skull fracture or pneumocephalus.  Right posterior parietal scalp hematoma noted.  IMPRESSION: Right posterior parietal scalp hematoma.  No evidence of skull fracture or intracranial abnormality.  CT CERVICAL  SPINE  Findings: No evidence of cervical spine fracture or subluxation. Intervertebral disc spaces are maintained.  No evidence of facet DJD or other bone abnormality.  IMPRESSION: Negative.  No evidence of cervical spine fracture or subluxation.   Original Report Authenticated By: Myles Rosenthal, M.D.   Ct Abdomen Pelvis W Contrast  10/20/2012   *RADIOLOGY REPORT*  Clinical Data: Level I trauma.   Skateboarding accident.  Fall. Altered mental status.  CT ABDOMEN AND PELVIS WITH CONTRAST  Technique:  Multidetector CT imaging of the abdomen and pelvis was performed following the standard protocol during bolus administration of intravenous contrast.  Contrast: OMNIPAQUE IOHEXOL 300 MG/ML  SOLN  Comparison: None.  Findings: No evidence of lacerations or contusions involving the abdominal parenchymal organs.  No evidence of hemoperitoneum. Unopacified bowel is unremarkable in appearance.  No soft tissue masses or inflammatory process identified.  No other abnormal fluid collections are seen.  No evidence of fractures.  IMPRESSION: No evidence of visceral injury, hemoperitoneum, or other significant abnormality.   Original Report Authenticated By: Myles Rosenthal, M.D.   Dg Chest Port 1 View  10/21/2012   *RADIOLOGY REPORT*  Clinical Data: Assess endotracheal tube.  PORTABLE CHEST - 1 VIEW  Comparison: 10/20/2012.  Findings: The endotracheal tube is stable.  There is a new NG tube coursing down into the stomach.  The heart is not normal in size and the mediastinal and hilar contours are normal.  The lungs are clear.  IMPRESSION:  1.  Stable ET tube. 2.  New NG tube. 3.  No acute cardiopulmonary findings.   Original Report Authenticated By: Rudie Meyer, M.D.   Dg Chest Portable 1 View  10/20/2012   *RADIOLOGY REPORT*  Clinical Data: Level I trauma.  Head injury.  Unresponsive.  Acute respiratory failure.  Intubation.  PORTABLE CHEST - 1 VIEW  Comparison: None.  Findings: Endotracheal tube is seen in appropriate position with tip approximately 4 cm above the carina.  Both lungs are clear.  No evidence of pneumothorax or hemothorax.  Heart size is normal.  No evidence of abnormal mediastinal widening or tracheal deviation.  IMPRESSION: Endotracheal tube in appropriate position.  No acute findings.   Original Report Authenticated By: Myles Rosenthal, M.D.    Anti-infectives: Anti-infectives   None       Assessment/Plan: Skateboard crash Resp - stable on RA TBI/concussion - appreciate neurology input, no further SZ, no antiepileptics at this time, TBI team therapies R posterior scalp hematoma R shoulder abrasion FEN - try clears C-spine - cleared VTE - lovenox To floor  LOS: 2 days    Violeta Gelinas, MD, MPH, FACS Pager: [phone]  10/22/2012

## 2012-10-22 NOTE — Progress Notes (Signed)
Patient ID: Christian Hartman, male   DOB: January 19, 1991, 22 y.o.   MRN: [id number] HR upper 30's but asymptomiatc.  Some ectopy on tele review.  12 lead HR 39 with questionable bigem pattern.  6N tele arranged. I consulted cardiology to evaluate further.  In light of F/U CT head ordered by neurology, will ensure OK prior to starting lovenox.  Violeta Gelinas, MD, MPH, FACS Pager: [phone]

## 2012-10-22 NOTE — Progress Notes (Signed)
OT attempted to work with pt and get OOB, Pt experiencing profound dizziness with braycardia, unable to sit up,   Dr. Janee Morn called, will hold transfer until after CT scan

## 2012-10-22 NOTE — Progress Notes (Signed)
PT Cancellation Note  Patient Details Name: Navdeep Halt MRN: [id number] DOB: 01-06-1991   Cancelled Treatment:    Reason Eval/Treat Not Completed: Medical issues which prohibited therapy; pt with new findings on CT with temporal fracture and pneumocephalus.  Will follow up after eval with trauma/neuro.   Chamar Broughton,CYNDI 10/22/2012, 3:00 PM

## 2012-10-23 ENCOUNTER — Encounter (HOSPITAL_COMMUNITY): Payer: Self-pay | Admitting: Physician Assistant

## 2012-10-23 DIAGNOSIS — I498 Other specified cardiac arrhythmias: Secondary | ICD-10-CM

## 2012-10-23 DIAGNOSIS — I495 Sick sinus syndrome: Secondary | ICD-10-CM

## 2012-10-23 MED ORDER — DOUBLE ANTIBIOTIC 500-10000 UNIT/GM EX OINT
TOPICAL_OINTMENT | Freq: Two times a day (BID) | CUTANEOUS | Status: DC
  Filled 2012-10-23 (×15): qty 1

## 2012-10-23 MED ORDER — DOUBLE ANTIBIOTIC 500-10000 UNIT/GM EX OINT
TOPICAL_OINTMENT | Freq: Two times a day (BID) | CUTANEOUS | Status: DC
  Filled 2012-10-23: qty 14.17

## 2012-10-23 NOTE — Consult Note (Signed)
Cardiology Consult Note   Patient ID: Christian Hartman MRN: [id number], DOB/AGE: 11/15/1990   Admit date: 10/20/2012 Date of Consult: 10/23/2012  Primary Physician: No primary provider on file. Primary Cardiologist: New  Reason for consult: bradycardia   HPI: Christian Hartman is a 22 y.o. 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   The patient was skateboarding and fell striking his head. He did not lose consciousness, and was alert. Shortly after, he experienced a seizure. He was brought to the ED where GCS was noted to be 3. He was intubated.   He was admitted and intubated. CXR revealed normal heart size and no acute cardiopulmonary process. UDS did reveal + THC. BMET- Cr mildly elevated at 1.41, otherwise unremarkable. CBC with a leukocytosis of 16.1K. Extubation was complicated by severe agitation requiring multiple sedatives. Initial noncontrast head CT revealed R posterior parietal scalp hematoma without evidence of skull fracture or intracranial deformity. CT spine without evidence of abnormality. CT abd/pelvis indicated no evidence of injury. Anticonvulsants were held per neurology recommendations. EEG was interpreted as normal. A repeat noncontrast head CT did reveal a R temporal bone fracture, resolving R occipital pneumocephalus and subdural hematomas. Telemetry review indicates sinus bradycardia with intermittent junctional beats, HR 40s. Asymptomatic with this. BP remains normotensive. O2 sat's 98-100% on RA. He ambulated with PT this AM, and HR increased to 90-100s. Currently extubated, somnolent but A&O x 3 when awakened.   The family reports he has no prior medical history. He has a grandfather with CHF, otherwise no family history of cardiomyopathy or arrhythmia. He is very active at baseline. He has not been told in the past that his HR is slow.  There is no history of thyroid disorder. No prior surgeries. No episodes of palpitations, lightheadedness or syncope prior to this admission.    Problem List: Past Medical History  Diagnosis Date  . Asthma     History reviewed. No pertinent past surgical history.   Allergies: No Known Allergies  Home Medications: Prior to Admission medications   Medication Sig Start Date End Date Taking? Authorizing Provider  albuterol (PROVENTIL) (5 MG/ML) 0.5% nebulizer solution Take 2.5 mg by nebulization every 6 (six) hours as needed for wheezing or shortness of breath.   Yes Historical Provider, MD    Inpatient Medications:  . pantoprazole  40 mg Oral Daily   Or  . pantoprazole (PROTONIX) IV  40 mg Intravenous Daily  . polymixin-bacitracin   Topical BID   Prescriptions prior to admission  Medication Sig Dispense Refill  . albuterol (PROVENTIL) (5 MG/ML) 0.5% nebulizer solution Take 2.5 mg by nebulization every 6 (six) hours as needed for wheezing or shortness of breath.        Family History  Problem Relation Age of Onset  . Congestive Heart Failure Maternal Grandfather      History   Social History  . Marital Status: Single    Spouse Name: N/A    Number of Children: N/A  . Years of Education: N/A   Occupational History  . Not on file.   Social History Main Topics  . Smoking status: Current Every Day Smoker  . Smokeless tobacco: Not on file  . Alcohol Use: No  . Drug Use: Yes    Special: Marijuana, Benzodiazepines  . Sexually Active: Not on file   Other Topics Concern  . Not on  file   Social History Narrative  . No narrative on file     Review of Systems: General: negative for chills, fever, night sweats or weight changes.  Cardiovascular: negative for chest pain, dyspnea on exertion, edema, orthopnea, palpitations, paroxysmal nocturnal dyspnea or shortness of breath Dermatological: negative for rash Respiratory: negative for cough or wheezing Urologic: negative  for hematuria Abdominal: positive for nausea, negative for vomiting, diarrhea, bright red blood per rectum, melena, or hematemesis Neurologic: positive for seizure, negative for visual changes, syncope, or dizziness All other systems reviewed and are otherwise negative except as noted above.  Physical Exam: Blood pressure 137/74, pulse 48, temperature 98.2 F (36.8 C), temperature source Oral, resp. rate 18, height 6\' 1"  (1.854 m), weight 158 lb 9.6 oz (71.94 kg), SpO2 98.00%.    General: Somnolent, well developed, well nourished, in no acute distress. Head: Normocephalic, atraumatic, sclera non-icteric, no xanthomas, nares are without discharge.  Neck: Negative for carotid bruits. JVD not elevated. Lungs: Clear bilaterally to auscultation without wheezes, rales, or rhonchi. Breathing is unlabored. Heart: Regular rhythm, bradycardic with S1 S2. No murmurs, rubs, or gallops appreciated. Prominent PMI just lateral to left mammary line. Abdomen: Soft, non-tender, non-distended with normoactive bowel sounds. No hepatomegaly. No rebound/guarding. No obvious abdominal masses. Msk:  Strength and tone appears normal for age. Extremities: No clubbing, cyanosis or edema.  Distal pedal pulses are 2+ and equal bilaterally. Neuro: Alert and oriented X 3. Moves all extremities spontaneously. No focal neurological deficits.  Psych:  Responds to questions appropriately with a normal affect.  Labs: Recent Labs     10/20/12  1949  10/21/12  0524  WBC  16.1*  16.6*  HGB  14.0  13.2  HCT  42.8  38.8*  MCV  96.6  92.2  PLT  259  205   Recent Labs Lab 10/20/12 1949 10/21/12 0524 10/22/12 0400  NA 143 137 140  K 3.9 3.9 4.3  CL 104 104 106  CO2 18* 24 22  BUN 20 13 11   CREATININE 1.41* 1.20 1.17  CALCIUM 9.5 9.4 9.5  PROT 8.2  --   --   BILITOT 0.7  --   --   ALKPHOS 74  --   --   ALT 17  --   --   AST 30  --   --   GLUCOSE 140* 107* 95   Radiology/Studies: Ct Head Wo Contrast  10/22/2012    *RADIOLOGY REPORT*  Clinical Data: Increased confusion and dizziness.  Skateboard accident.  CT HEAD WITHOUT CONTRAST  Technique:  Contiguous axial images were obtained from the base of the skull through the vertex without contrast.  Comparison: CT head and C-spine   10/20/2012.  Findings: There is slight improvement  in the right scalp hematoma noted previously.  There is minimal hyperdense thickening of the interhemispheric fissure, and slight prominence of the right and left tentorium. I believe there has been mild interhemispheric and peritentorial subdural hematoma which is stable to slightly improved from previous.  On  the prior examination there was a small bubble of pneumocephalus in the right occipital region(series 6 image 4, CT cervical spine from 10/20/2012) which has  now resolved.  Bone windows reveal diastasis of the right  lambdoid suture (image 18 series 4) and a small linear skull fracture just superior and anterior to the mastoid air cells in the right temporal squama (image 16 series 4)  Therefore, temporal bone CT without contrast is recommended for further evaluation as  there is some component of a temporal bone fracture which has allowed previous pneumocephalus. In addition, there is fluid (blood) in the right mastoid and middle ear not present previously.  No hemorrhage is noted  in the external canal.  Stable retention cyst right sphenoid.  No definite skull base fracture.  No parenchymal hemorrhage or brain edema.  No definite subarachnoid blood.  Ventricles normal size.  IMPRESSION: Resolving right occipital pneumocephalus, not previously reported. Right temporal squama skull fracture extends into the roof of the mastoid, and is associated with diastasis of the right lambdoid suture.  Suspected mild interhemispheric and peritentorial subdural hematomas, stable to slightly improved.  I discussed today's findings with the ordering provider. Temporal bone CT may be delayed 24 hours to  allow for the patient's ability to cooperate improve.   Original Report Authenticated By: Davonna Belling, M.D.   Ct Head Wo Contrast  10/20/2012   *RADIOLOGY REPORT*  Clinical Data:  Level I trauma.  Skateboarding head injury. Unconscious.  CT HEAD WITHOUT CONTRAST CT CERVICAL SPINE WITHOUT CONTRAST  Technique:  Multidetector CT imaging of the head and cervical spine was performed following the standard protocol without intravenous contrast.  Multiplanar CT image reconstructions of the cervical spine were also generated.  Comparison:   None  CT HEAD  Findings: There is no evidence of intracranial hemorrhage, brain edema or other signs of acute infarction.  There is no evidence of intracranial mass lesion or mass effect.  No abnormal extra-axial fluid collections are identified.  Ventricles are normal in size.  No evidence of skull fracture or pneumocephalus.  Right posterior parietal scalp hematoma noted.  IMPRESSION: Right posterior parietal scalp hematoma.  No evidence of skull fracture or intracranial abnormality.  CT CERVICAL SPINE  Findings: No evidence of cervical spine fracture or subluxation. Intervertebral disc spaces are maintained.  No evidence of facet DJD or other bone abnormality.  IMPRESSION: Negative.  No evidence of cervical spine fracture or subluxation.   Original Report Authenticated By: Myles Rosenthal, M.D.   Ct Cervical Spine Wo Contrast  10/20/2012   *RADIOLOGY REPORT*  Clinical Data:  Level I trauma.  Skateboarding head injury. Unconscious.  CT HEAD WITHOUT CONTRAST CT CERVICAL SPINE WITHOUT CONTRAST  Technique:  Multidetector CT imaging of the head and cervical spine was performed following the standard protocol without intravenous contrast.  Multiplanar CT image reconstructions of the cervical spine were also generated.  Comparison:   None  CT HEAD  Findings: There is no evidence of intracranial hemorrhage, brain edema or other signs of acute infarction.  There is no evidence of intracranial  mass lesion or mass effect.  No abnormal extra-axial fluid collections are identified.  Ventricles are normal in size.  No evidence of skull fracture or pneumocephalus.  Right posterior parietal scalp hematoma noted.  IMPRESSION: Right posterior parietal scalp hematoma.  No evidence of skull fracture or intracranial abnormality.  CT CERVICAL SPINE  Findings: No evidence of cervical spine fracture or subluxation. Intervertebral disc spaces are maintained.  No evidence of facet DJD or other bone abnormality.  IMPRESSION: Negative.  No evidence of cervical spine fracture or subluxation.   Original Report Authenticated By: Myles Rosenthal, M.D.   Ct Abdomen Pelvis W Contrast  10/20/2012   *RADIOLOGY REPORT*  Clinical Data: Level I trauma.  Skateboarding accident.  Fall. Altered mental status.  CT ABDOMEN AND PELVIS WITH CONTRAST  Technique:  Multidetector CT imaging of the abdomen and pelvis was performed following the standard protocol during bolus  administration of intravenous contrast.  Contrast: OMNIPAQUE IOHEXOL 300 MG/ML  SOLN  Comparison: None.  Findings: No evidence of lacerations or contusions involving the abdominal parenchymal organs.  No evidence of hemoperitoneum. Unopacified bowel is unremarkable in appearance.  No soft tissue masses or inflammatory process identified.  No other abnormal fluid collections are seen.  No evidence of fractures.  IMPRESSION: No evidence of visceral injury, hemoperitoneum, or other significant abnormality.   Original Report Authenticated By: Myles Rosenthal, M.D.   Dg Chest Port 1 View  10/21/2012   *RADIOLOGY REPORT*  Clinical Data: Assess endotracheal tube.  PORTABLE CHEST - 1 VIEW  Comparison: 10/20/2012.  Findings: The endotracheal tube is stable.  There is a new NG tube coursing down into the stomach.  The heart is not normal in size and the mediastinal and hilar contours are normal.  The lungs are clear.  IMPRESSION:  1.  Stable ET tube. 2.  New NG tube. 3.  No acute  cardiopulmonary findings.   Original Report Authenticated By: Rudie Meyer, M.D.   Dg Chest Portable 1 View  10/20/2012   *RADIOLOGY REPORT*  Clinical Data: Level I trauma.  Head injury.  Unresponsive.  Acute respiratory failure.  Intubation.  PORTABLE CHEST - 1 VIEW  Comparison: None.  Findings: Endotracheal tube is seen in appropriate position with tip approximately 4 cm above the carina.  Both lungs are clear.  No evidence of pneumothorax or hemothorax.  Heart size is normal.  No evidence of abnormal mediastinal widening or tracheal deviation.  IMPRESSION: Endotracheal tube in appropriate position.  No acute findings.   Original Report Authenticated By: Myles Rosenthal, M.D.    EKG: sinus bradycardia, 39 bpm, sinus arrhythmia, no ST/T changes  ASSESSMENT AND PLAN:   22 y.o. 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   1. Sinus bradycardia 2. Traumatic fall with resultant R temporal bone fracture, SDH and pneumocephalus 3. Seizure 4. Substance abuse  The patient has remained hemodynamically stable despite low HR in high 30s-40s at times. BP normotensive. HR appropriately increases with activity. He has no prior cardiac history, nor knowledge if his HR is slow at baseline. Given that he is young and quite active, suspect resting HR may be low or low normal. Bradycardia may be potentiated by significant sedation/analgesics this admission, vagal stimulation from pain, nausea +/- changes in ICP with pneumocephalus, SDH and inflammation. There are no focal neurological deficits. The patient is awake, responsive and A&O x 3, despite being somnolent (further supporting sedative/analgesic effects. To be comprehensive, could check TSH and Mg. Would allow patient to recover from traumatic injury. Unclear that pursuing an echocardiogram would be of high yield given no evidence  of cardiomegaly or underlying primary cardiac process.   Signed, R. Hurman Horn, PA-C 10/23/2012, 11:57 AM  I have taken a history, reviewed medications, allergies, PMH, SH, FH, and reviewed ROS and examined the patient.  I agree with the assessment and plan. No further cardiac workup unless bradycardia becomes symptomatic. Ryelle Ruvalcaba C. Daleen Squibb, MD, [facility] Pager:  [phone]

## 2012-10-23 NOTE — Progress Notes (Signed)
NEURO HOSPITALIST PROGRESS NOTE   SUBJECTIVE:                                                                                                                        Feeling great, no neurological complains. Stated that wants to go home. EEG normal. CT brain showed resolving right occipital pneumocephalus, not previously reported.  Right temporal squama skull fracture extends into the roof of the mastoid, and is associated with diastasis of the right lambdoid suture. Suspected mild interhemispheric and peritentorial subdural hematomas, stable to slightly improved.   OBJECTIVE:                                                                                                                           Vital signs in last 24 hours: Temp:  [98.3 F (36.8 C)-99.3 F (37.4 C)] 98.3 F (36.8 C) (05/21 0715) Pulse Rate:  [27-53] 53 (05/21 0900) Resp:  [13-19] 17 (05/21 0900) BP: (110-132)/(52-97) 125/69 mmHg (05/21 0900) SpO2:  [82 %-100 %] 99 % (05/21 0900)  Intake/Output from previous day: 05/20 0701 - 05/21 0700 In: 2620 [P.O.:120; I.V.:2500] Out: 2400 [Urine:2400] Intake/Output this shift: Total I/O In: 233.3 [I.V.:233.3] Out: 150 [Urine:150] Nutritional status: General  Past Medical History  Diagnosis Date  . Asthma     Neurologic Exam:  Mental Status: Alert, oriented, thought content appropriate.  Speech fluent without evidence of aphasia.  Able to follow 3 step commands without difficulty. Cranial Nerves: II: Discs flat bilaterally; Visual fields grossly normal, pupils equal, round, reactive to light and accommodation III,IV, VI: ptosis not present, extra-ocular motions intact bilaterally V,VII: smile symmetric, facial light touch sensation normal bilaterally VIII: hearing normal bilaterally IX,X: gag reflex present XI: bilateral shoulder shrug XII: midline tongue extension Motor: Right : Upper extremity   5/5    Left:     Upper  extremity   5/5  Lower extremity   5/5     Lower extremity   5/5 Tone and bulk:normal tone throughout; no atrophy noted Sensory: Pinprick and light touch intact throughout, bilaterally Deep Tendon Reflexes: 2+ and symmetric throughout Plantars: Right: downgoing   Left: downgoing Cerebellar: normal finger-to-nose,  normal heel-to-shin test Gait: No tested. CV: pulses palpable throughout  Lab Results: No results found for this basename: cbc, bmp, coags, chol, tri, ldl, hga1c   Lipid Panel No results found for this basename: CHOL, TRIG, HDL, CHOLHDL, VLDL, LDLCALC,  in the last 72 hours  Studies/Results: Ct Head Wo Contrast  10/22/2012   *RADIOLOGY REPORT*  Clinical Data: Increased confusion and dizziness.  Skateboard accident.  CT HEAD WITHOUT CONTRAST  Technique:  Contiguous axial images were obtained from the base of the skull through the vertex without contrast.  Comparison: CT head and C-spine   10/20/2012.  Findings: There is slight improvement  in the right scalp hematoma noted previously.  There is minimal hyperdense thickening of the interhemispheric fissure, and slight prominence of the right and left tentorium. I believe there has been mild interhemispheric and peritentorial subdural hematoma which is stable to slightly improved from previous.  On  the prior examination there was a small bubble of pneumocephalus in the right occipital region(series 6 image 4, CT cervical spine from 10/20/2012) which has  now resolved.  Bone windows reveal diastasis of the right  lambdoid suture (image 18 series 4) and a small linear skull fracture just superior and anterior to the mastoid air cells in the right temporal squama (image 16 series 4)  Therefore, temporal bone CT without contrast is recommended for further evaluation as there is some component of a temporal bone fracture which has allowed previous pneumocephalus. In addition, there is fluid (blood) in the right mastoid and middle ear not  present previously.  No hemorrhage is noted  in the external canal.  Stable retention cyst right sphenoid.  No definite skull base fracture.  No parenchymal hemorrhage or brain edema.  No definite subarachnoid blood.  Ventricles normal size.  IMPRESSION: Resolving right occipital pneumocephalus, not previously reported. Right temporal squama skull fracture extends into the roof of the mastoid, and is associated with diastasis of the right lambdoid suture.  Suspected mild interhemispheric and peritentorial subdural hematomas, stable to slightly improved.  I discussed today's findings with the ordering provider. Temporal bone CT may be delayed 24 hours to allow for the patient's ability to cooperate improve.   Original Report Authenticated By: Davonna Belling, M.D.    MEDICATIONS                                                                                                                       I have reviewed the patient's current medications.  ASSESSMENT/PLAN:  Immediate isolated post traumatic seizure. Normal EEG and unremarkable neuro-exam CT brain findings noted and overall they are improved. Will need follow up temporal bone CT in 24 hours.  No need for anticonvulsants at this time. Will follow up.    Wyatt Portela ,MD [facility] [phone]  10/23/2012, 9:33 AM

## 2012-10-23 NOTE — Progress Notes (Signed)
Patient ID: Christian Hartman, male   DOB: [birth year], 22 y.o.   MRN: [id number]    Subjective: Wants to go home, tolerated clears  Objective: Vital signs in last 24 hours: Temp:  [98.3 F (36.8 C)-99.3 F (37.4 C)] 98.3 F (36.8 C) (05/21 0715) Pulse Rate:  [27-48] 43 (05/21 0800) Resp:  [13-19] 13 (05/21 0800) BP: (110-132)/(52-97) 117/66 mmHg (05/21 0800) SpO2:  [82 %-100 %] 97 % (05/21 0800) Last BM Date:  (Prior to admission)  Intake/Output from previous day: 05/20 0701 - 05/21 0700 In: 2620 [P.O.:120; I.V.:2500] Out: 2400 [Urine:2400] Intake/Output this shift: Total I/O In: 100 [I.V.:100] Out: -   General appearance: alert and cooperative Resp: clear to auscultation bilaterally Cardio: reg 40's GI: soft, nt, nd, +BS Neuro: PERL, EOMI, F/C, MAE, speech fluent  Lab Results: CBC   Recent Labs  10/20/12 1949 10/21/12 0524  WBC 16.1* 16.6*  HGB 14.0 13.2  HCT 42.8 38.8*  PLT 259 205   BMET  Recent Labs  10/21/12 0524 10/22/12 0400  NA 137 140  K 3.9 4.3  CL 104 106  CO2 24 22  GLUCOSE 107* 95  BUN 13 11  CREATININE 1.20 1.17  CALCIUM 9.4 9.5   PT/INR No results found for this basename: LABPROT, INR,  in the last 72 hours ABG  Recent Labs  10/20/12 2038  PHART 7.322*  HCO3 21.4    Studies/Results: Ct Head Wo Contrast  10/22/2012   *RADIOLOGY REPORT*  Clinical Data: Increased confusion and dizziness.  Skateboard accident.  CT HEAD WITHOUT CONTRAST  Technique:  Contiguous axial images were obtained from the base of the skull through the vertex without contrast.  Comparison: CT head and C-spine   10/20/2012.  Findings: There is slight improvement  in the right scalp hematoma noted previously.  There is minimal hyperdense thickening of the interhemispheric fissure, and slight prominence of the right and left tentorium. I believe there has been mild interhemispheric and peritentorial subdural hematoma which is stable to slightly improved from previous.  On   the prior examination there was a small bubble of pneumocephalus in the right occipital region(series 6 image 4, CT cervical spine from 10/20/2012) which has  now resolved.  Bone windows reveal diastasis of the right  lambdoid suture (image 18 series 4) and a small linear skull fracture just superior and anterior to the mastoid air cells in the right temporal squama (image 16 series 4)  Therefore, temporal bone CT without contrast is recommended for further evaluation as there is some component of a temporal bone fracture which has allowed previous pneumocephalus. In addition, there is fluid (blood) in the right mastoid and middle ear not present previously.  No hemorrhage is noted  in the external canal.  Stable retention cyst right sphenoid.  No definite skull base fracture.  No parenchymal hemorrhage or brain edema.  No definite subarachnoid blood.  Ventricles normal size.  IMPRESSION: Resolving right occipital pneumocephalus, not previously reported. Right temporal squama skull fracture extends into the roof of the mastoid, and is associated with diastasis of the right lambdoid suture.  Suspected mild interhemispheric and peritentorial subdural hematomas, stable to slightly improved.  I discussed today's findings with the ordering provider. Temporal bone CT may be delayed 24 hours to allow for the patient's ability to cooperate improve.   Original Report Authenticated By: Davonna Belling, M.D.    Anti-infectives: Anti-infectives   None      Assessment/Plan: Skateboard crash Resp - stable on RA CV -  asymptomatic bradycardia with some ectopy, cardiology consult P TBI/skull FX//SDH/concussion - MS improved, TBI team therapies R posterior scalp hematoma R shoulder abrasion FEN - clears and advance as tolerated VTE - lovenox  To floor I spoke to his sister  LOS: 3 days    Violeta Gelinas, MD, MPH, FACS Pager: [phone]  10/23/2012

## 2012-10-23 NOTE — Progress Notes (Signed)
Patient stated he wanted to go home, I explained to him that he can not go home at this time. He also wanted to talk to the Dr. and stated he would refuse treatment until he talk to the Dr. Jeanene Erb Dr. and he is not able to come at this time and meet with the patient. Will continue to monitor. Burnett Corrente, RN

## 2012-10-23 NOTE — Progress Notes (Signed)
Occupational Therapy Treatment Patient Details Name: Christian Hartman MRN: [id number] DOB: 04/11/1991 Today's Date: 10/23/2012 Time: 0865-7846 OT Time Calculation (min): 42 min  OT Assessment / Plan / Recommendation Comments on Treatment Session  Pt demonstrates decreased thoroughness and sequencing deficits with Basic grooming tasks.  Pt requires max cues for intellectual awareness, as he will ask questions about fall when told about it, but insists that it didn't happen, and that he has no deficits.  Pt with significant memory deficits.  Pt insists that he needs to discharge home.  Family informed therapist that pt is extremely "germophobic", is very private/guarded, and has h/o ADD.  They were instructed in methods for cuing pt to improve memory and awareness, need for 24 hour supervision, and f/u OPOT.    Follow Up Recommendations  Supervision/Assistance - 24 hour;Outpatient OT    Barriers to Discharge       Equipment Recommendations  None recommended by OT    Recommendations for Other Services    Frequency Min 2X/week   Plan Discharge plan remains appropriate    Precautions / Restrictions Precautions Precautions: Fall Restrictions Weight Bearing Restrictions: No   Pertinent Vitals/Pain     ADL  Grooming: Teeth care;Min guard Where Assessed - Grooming: Unsupported sitting;Unsupported standing Transfers/Ambulation Related to ADLs: min guard assist ADL Comments: Pt. insisting he needs to discharge home immediately - difficult to re-direct pt.  Pt brushed teeth with decreased thoroughness, and he failed to rinse mouth afterwards.  Cues needed to rinse toothbrush.  Long discussion with parents re: pt behavior and cognitive deficits.  Both report that pt with h/o ADD, that he is very private, and very stubborn, and will not acknowledge difficulties normally - they report that what they are seeing behaviorally is his normal behavior magnified.  Discussed Brain injury, ways to cue and  engage pt, as well as it is very difficult to project long term function at this time.  Pt denies dizziness with OT.  Vision assesed:  EOMs full; pursuits WNL; Saccades - moving and static - WNL; convergence WNL; gaze stabilization WNL; Fields WNL.  Pt able to read small print on phone with no difficulty      OT Diagnosis:    OT Problem List:   OT Treatment Interventions:     OT Goals Acute Rehab OT Goals OT Goal Formulation: With patient Time For Goal Achievement: 10/28/12 Potential to Achieve Goals: Good ADL Goals Pt Will Perform Grooming: with modified independence;Standing at sink ADL Goal: Grooming - Progress: Progressing toward goals Pt Will Perform Upper Body Bathing: with modified independence;Sitting, chair;Sitting, edge of bed Pt Will Perform Upper Body Dressing: with modified independence;Sitting, chair;Sitting, bed Pt Will Perform Lower Body Dressing: with modified independence;Sit to stand from chair;Sit to stand from bed Pt Will Transfer to Toilet: with modified independence;Ambulation;Comfort height toilet Pt Will Perform Toileting - Clothing Manipulation: with modified independence;Standing Pt Will Perform Toileting - Hygiene: Independently;Sitting on 3-in-1 or toilet Pt Will Perform Tub/Shower Transfer: with modified independence;Ambulation;Tub transfer Additional ADL Goal #1: Pt will perform path finding activity with no more than min cues ADL Goal: Additional Goal #1 - Progress: Progressing toward goals Additional ADL Goal #2: Pt will participate in further visual assesment ADL Goal: Additional Goal #2 - Progress: Met Miscellaneous OT Goals Miscellaneous OT Goal #1: Pt will demonstrate intellectual awareness of deficits with min verbal cues OT Goal: Miscellaneous Goal #1 - Progress: Goal set today  Visit Information  Last OT Received On: 10/23/12 Assistance Needed: +1 PT/OT  Co-Evaluation/Treatment: Yes    Subjective Data      Prior Functioning        Cognition  Cognition Arousal/Alertness: Awake/alert Behavior During Therapy: Flat affect Overall Cognitive Status: Impaired/Different from baseline Area of Impairment: Orientation;Attention;Memory;Safety/judgement;Awareness;Problem solving Orientation Level: Situation;Disoriented to Current Attention Level: Selective Memory: Decreased short-term memory Following Commands: Follows one step commands consistently Safety/Judgement: Decreased awareness of safety;Decreased awareness of deficits Awareness:  (Pt does not believe he had an accident - no awareness) Problem Solving: Slow processing;Decreased initiation;Difficulty sequencing;Requires verbal cues General Comments: Pt more alert this date.  He is disoriented to situation, but when informed of accident will ask questions about it, and will reference it spontaneously in conversation.  He, however, denies that he was injured, or that he really fell.  Pt able to recall events of day with ~25% accuracy, when corrected, he denies, or makes excuse for not remembering.  Pt reports he loves poetry and often recites and writes poetry, but is unable to recall any poems.  When pt asked if this was normal, he finally acknowledged that it was not, but blamed being in hospital as the reason.   Rancho Levels of Cognitive Functioning Rancho Los Amigos Scales of Cognitive Functioning: Confused/appropriate    Mobility  Bed Mobility Bed Mobility: Supine to Sit;Sitting - Scoot to Edge of Bed;Sit to Supine Supine to Sit: 5: Supervision;HOB flat;With rails Sitting - Scoot to Edge of Bed: 5: Supervision Sit to Supine: 5: Supervision Details for Bed Mobility Assistance: pt mildly unsteady Transfers Transfers: Sit to Stand;Stand to Sit Sit to Stand: 4: Min guard;From bed Stand to Sit: 4: Min guard;To bed    Exercises      Balance     End of Session OT - End of Session Activity Tolerance: Patient tolerated treatment well Patient left: in bed;with call  bell/phone within reach;with family/visitor present  GO     Audi Conover, Ursula Alert M 10/23/2012, 3:30 PM

## 2012-10-23 NOTE — Progress Notes (Signed)
Physical Therapy Treatment Patient Details Name: Christian Hartman MRN: [id number] DOB: 14-Aug-1990 Today's Date: 10/23/2012 Time: 6213-0865 PT Time Calculation (min): 24 min  PT Assessment / Plan / Recommendation Comments on Treatment Session  Pt with overall improvement both cognitively and with mobility progression. Pt however remains to have balance impairments placing pt at increased falls risk. Pt con't to have short term memory deficits as well affecting executive functioning. Pt to benefit from outpatient PT to achive maximial functional recovery to achieve PTA function.    Follow Up Recommendations  Supervision/Assistance - 24 hour;Outpatient PT     Does the patient have the potential to tolerate intense rehabilitation     Barriers to Discharge        Equipment Recommendations  None recommended by PT    Recommendations for Other Services    Frequency Min 3X/week   Plan Discharge plan remains appropriate;Frequency remains appropriate    Precautions / Restrictions Precautions Precautions: Fall Restrictions Weight Bearing Restrictions: No   Pertinent Vitals/Pain Pt denies pain    Mobility  Bed Mobility Bed Mobility: Supine to Sit;Sitting - Scoot to Edge of Bed;Sit to Supine Supine to Sit: 5: Supervision;HOB flat;With rails Sitting - Scoot to Edge of Bed: 5: Supervision Sit to Supine: 5: Supervision Details for Bed Mobility Assistance: v/c's to decrease pace to dec dizziness Transfers Transfers: Sit to Stand;Stand to Sit Sit to Stand: 4: Min guard;With upper extremity assist;From bed Stand to Sit: 5: Supervision;With upper extremity assist;To bed Details for Transfer Assistance: v/c's to minimalize impulsivity Ambulation/Gait Ambulation/Gait Assistance: 4: Min guard Ambulation Distance (Feet): 200 Feet Assistive device: None Ambulation/Gait Assistance Details: grossly WFL, minimal instabilty , able to navigate with minimal v/c's Gait Pattern: Step-through pattern Gait  velocity: WFL for community mobility Stairs: Yes Stairs Assistance: 4: Min guard Stair Management Technique: No rails Number of Stairs: 5 Wheelchair Mobility Wheelchair Mobility: No    Exercises     PT Diagnosis:    PT Problem List:   PT Treatment Interventions:     PT Goals Acute Rehab PT Goals PT Goal: Sit to Stand - Progress: Progressing toward goal PT Goal: Ambulate - Progress: Progressing toward goal PT Goal: Up/Down Stairs - Progress: Progressing toward goal  Visit Information  Last PT Received On: 10/23/12 Assistance Needed: +1    Subjective Data  Subjective: Pt received supine in bed agreeable to OOB mobility.   Cognition  Cognition Arousal/Alertness: Awake/alert Behavior During Therapy: Flat affect (impulsive) Overall Cognitive Status: Impaired/Different from baseline Area of Impairment: Attention;Memory;Safety/judgement;Awareness;Problem solving Orientation Level:  (now oriented x3) Current Attention Level: Sustained Memory: Decreased short-term memory Following Commands: Follows one step commands consistently Safety/Judgement: Decreased awareness of deficits;Decreased awareness of safety Awareness: Intellectual Problem Solving: Slow processing General Comments: pt with overall improverment in cognition but remains to have significant short temr memory deficits. Pt unable to recognize/recall speech therapist s/p 5 min and was unable to recall room number s/p 5 min. Pt con't to have decreased insight to deficits and is impulsive. Rancho Levels of Cognitive Functioning Rancho Los Amigos Scales of Cognitive Functioning: Confused/appropriate    Balance  Balance Balance Assessed: Yes Static Sitting Balance Static Sitting - Balance Support: Feet supported;Bilateral upper extremity supported Static Sitting - Level of Assistance: 5: Stand by assistance Static Standing Balance Static Standing - Balance Support: During functional activity (urinating at  [employer]) Static Standing - Level of Assistance: 4: Min assist Static Standing - Comment/# of Minutes: pt with LOB during urination requiring minA to regain. Pt  unable to maintain balance with feet together, tandem stance and single limb stance  End of Session PT - End of Session Equipment Utilized During Treatment: Gait belt Activity Tolerance: Patient tolerated treatment well Patient left: in bed;with call bell/phone within reach;with bed alarm set;with family/visitor present Nurse Communication: Mobility status   GP     Marcene Brawn 10/23/2012, 2:37 PM  Lewis Shock, PT, DPT Pager #: [phone] Office #: [phone]

## 2012-10-23 NOTE — Progress Notes (Signed)
Speech Language Pathology Treatment Patient Details Name: Christian Hartman MRN: [id number] DOB: 01/04/1991 Today's Date: 10/23/2012 Time: 1030-1100 SLP Time Calculation (min): 30 min  Assessment / Plan / Recommendation Clinical Impression  SLP provided facilitation of cognitive recovery during a functional ADL. Patient with improved sustained attention to basic task today, requiring min verbal cueing to redirect. Able to demonstrate basic carryover of recently (1 min) taught information with min clinician cues however noted breakdown to occur following longer intervals (5 minutes). Max cueing provided for anticipatory awareness of impact of deficits on discharge planning. Oriented x 4 today without clinician cues. Education complete with patient's sister and mother regarding current cognitive function, continued impairements, and need for 24 hour supervision following discharge as well as OP SLP f/u.     SLP Plan  Continue with current plan of care    Pertinent Vitals/Pain None reported  SLP Goals  SLP Goals Potential to Achieve Goals: Good Progress/Goals/Alternative treatment plan discussed with pt/caregiver and they: Agree SLP Goal #1: Patient will sustain attention to functional task for 30 minutes with min cues to redirect SLP Goal #1 - Progress: Progressing toward goal SLP Goal #2: Patient will utilize external aids to facilitate short term recall of information with min cues.  SLP Goal #2 - Progress: Progressing toward goal SLP Goal #3: Patient will demonstrate anticipatory awareness of needs after d/c with mod assist.  SLP Goal #3 - Progress: Progressing toward goal        Treatment Treatment focused on: Cognition;Patient/family/caregiver education Family/Caregiver Educated: sister, mother Skilled Treatment: SLP provided facilitation of cognitive recovery during a functional ADL. Patient with improved sustained attention to basic task today, requiring min verbal cueing to redirect.  Able to demonstrate basic carryover of recently (1 min) taught information with min clinician cues however noted breakdown to occur following longer intervals (5 minutes). Max cueing provided for anticipatory awareness of impact of deficits on discharge planning. Oriented x 4 today without clinician cues. Education complete with patient's sister and mother regarding current cognitive function, continued impairements, and need for 24 hour supervision following discharge as well as OP SLP f/u.    GO    Ferdinand Lango MA, CCC-SLP [phone]  Ferdinand Lango Meryl 10/23/2012, 2:01 PM

## 2012-10-23 NOTE — Procedures (Signed)
EEG NUMBER:  HISTORY:  A 22 year old male, status post closed head injury with altered mental status.  MEDICATIONS:  Lovenox, Versed, morphine, Zofran, oxycodone, Protonix, Polysporin, Compazine, Diprivan.  CONDITIONS OF RECORDING:  This is a 16-channel EEG carried out with the patient in the lethargic state.  DESCRIPTION:  The waking background activity consists of a low-voltage, symmetrical, fairly well organized 9-10 Hz alpha activity seen from the parieto-occipital and posterotemporal regions.  Low voltage, fast activity, poorly organized was seen and during at times, superimposed on more posterior rhythms.  A mixture of theta and alpha rhythm was seen from these central and temporal regions.  The patient is seen to drowse throughout a good portion of the tracing with slowing to low voltage theta and beta activity.  The patient goes into a light sleep and remains in the light sleep throughout the majority of the tracing with symmetrical sleep spindles.  The vertex was a sharp activity and irregular slow activity.  Hyperventilation was performed and exhibited a mild-to-moderate buildup, but failed to elicit any abnormalities. Intermittent photic stimulation was performed, but failed to elicit any change in the tracing.  IMPRESSION:  This is a normal EEG.          ______________________________ Thana Farr, MD    [id number] D:  10/23/2012 01:47:15  T:  10/23/2012 05:42:38  Job #:  [id number]

## 2012-10-23 NOTE — Clinical Social Work Note (Signed)
Clinical Social Work Department BRIEF PSYCHOSOCIAL ASSESSMENT 10/23/2012  Patient:  Christian Hartman, Christian Hartman     Account Number:  [id number]     Admit date:  10/20/2012  Clinical Social Worker:  Verl Blalock  Date/Time:  10/23/2012 03:00 PM  Referred by:  Physician  Date Referred:  10/23/2012 Referred for  Psychosocial assessment  Substance Abuse   Other Referral:   Interview type:  Patient Other interview type:   Patient family had provided background information prior to patient interaction.    PSYCHOSOCIAL DATA Living Status:  FAMILY Admitted from facility:   Level of care:   Primary support name:  Christian Hartman [phone] Primary support relationship to patient:  PARENT Degree of support available:   Patient mother and father are extremely supportive    CURRENT CONCERNS Current Concerns  None Noted   Other Concerns:    SOCIAL WORK ASSESSMENT / PLAN Clinical Social Worker met with patient alone at bedside to offer support, inquire about current substance use and discuss patient needs at discharge.  Patient states that he was out on his skateboard that he rides everyday when he fell off and hit his head.  Patient does admit to not wearing a helmet at the time of the accident.  Patient states that he lives at home with his family and is working and going to school.  Patient family is able to confirm that patient does currently live with them and that they plan to have him return with extensive assistance as needed.  Patient with follow up with outpatient therapies.    Clinical Social Worker inquired about current substance use.  Per report, a bookbag was found at the scene in which marijuana and barbituates were present.  Patient states that he does not know anything about barbituates, however he does smoke marijuana 2-3 times a week and likes to have it on him at all times.  Patient does not have any desire to stop at this time - resources declined.  Patient does not express any  concerns regarding alcohol use.  SBIRT complete.  PATIENT HAS REQUESTED THAT FAMILY NOT BE NOTIFIED OF MARIJUANA USE.  Clinical Social Worker signing off at this time.  No further needs identified.  Please reconsult if further needs arise.   Assessment/plan status:  No Further Intervention Required Other assessment/ plan:   Information/referral to community resources:   Clinical Social Worker offered patient and patient family numerous resources, however all were declined at this time.    PATIENT'S/FAMILY'S RESPONSE TO PLAN OF CARE: Patient alert and oriented x3 laying in the bed with intermittent eye closing.  Patient remains engaged in conversation and willing to share information after rapport was built at bedside.  Patient with excellent family support at home.  Patient did anxiously state several times that he needs to be home by Friday afternoon to take care of a private matter.  Patient and family expressed their appreciation for CSW support and concern.    [name], [place] [phone]

## 2012-10-24 ENCOUNTER — Inpatient Hospital Stay (HOSPITAL_COMMUNITY): Payer: PRIVATE HEALTH INSURANCE

## 2012-10-24 DIAGNOSIS — J45909 Unspecified asthma, uncomplicated: Secondary | ICD-10-CM | POA: Insufficient documentation

## 2012-10-24 MED ORDER — HYDROCODONE-ACETAMINOPHEN 5-325 MG PO TABS: 1.0000 | ORAL_TABLET | ORAL | Status: DC | PRN

## 2012-10-24 NOTE — Progress Notes (Signed)
Patient ID: Christian Hartman, male   DOB: 22-Sep-1990, 22 y.o.   MRN: [id number]   LOS: 4 days   Subjective: No new c/o. Ready to go home.   Objective: Vital signs in last 24 hours: Temp:  [98 F (36.7 C)-98.7 F (37.1 C)] 98.6 F (37 C) (05/22 0532) Pulse Rate:  [43-64] 49 (05/22 0532) Resp:  [13-18] 14 (05/22 0532) BP: (101-137)/(57-83) 111/58 mmHg (05/22 0532) SpO2:  [97 %-100 %] 99 % (05/22 0532) Last BM Date:  (Prior to admission)   Radiology Results HCT: Pending   Physical Exam General appearance: alert and no distress Resp: clear to auscultation bilaterally Cardio: regular rate and rhythm   Assessment/Plan: Skateboard crash  CV - asymptomatic bradycardia with some ectopy, cardiology signed off TBI/skull FX//SDH/concussion  R posterior scalp hematoma  R shoulder abrasion  Dispo -- Home after HCT    Freeman Caldron, PA-C Pager: [phone] General Trauma [facility] Pager: [phone]   10/24/2012

## 2012-10-24 NOTE — Progress Notes (Signed)
Telemetry reviewed and stable.  Reveals sinus bradycardia. No indication for pacing at this time.  No further cardiology evaluation planned. Will sign off. Call with questions.

## 2012-10-24 NOTE — Discharge Summary (Signed)
Christian Desmith, MD, MPH, FACS Pager: [phone]  

## 2012-10-24 NOTE — Discharge Summary (Signed)
Physician Discharge Summary  Patient ID: Rashard Ryle MRN: [id number] DOB/AGE: 01-25-1991 22 y.o.  Admit date: 10/20/2012 Discharge date: 10/24/2012  Discharge Diagnoses Patient Active Problem List   Diagnosis Date Noted  . Asthma, chronic 10/24/2012  . Skull fracture 10/22/2012  . Subdural hemorrhage, traumatic 10/22/2012  . Fall from skateboard 10/21/2012  . Concussion 10/21/2012  . Acute respiratory failure 10/21/2012  . 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 Noel Christmas for neurology  Dr. Valera Castle for cardiology   Procedures None   HPI: Maddix was the unhelmeted rider of a skateboard and fell. By report he got up and was alert and then had a seizure. There was question of some posturing and he came in as a level 1 trauma with a GCS of 3 since the seizure. He was Intubated after arrival to the ER. Initial head CT was read as negative. He was admitted to the neurologic ICU and neurology was consulted.   Hospital Course: The day after admission the patient was alert and able to be extubated without difficulty. He had typical brain injury sequelae and the TBI therapy team was consulted. He gradually improved over his hospitalization. He was noted to be significantly but asymptomatically bradycardic and cardiology was consulted but did not recommend any intervention. A follow-up head CT showed a right temporal skull fracture and some resolving subdural hematomas; review of the previous CT confirmed that those findings had been missed. By the time of discharge he was not having significant pain and he was tolerating a regular diet. He was discharged home in improved condition in the care of his family.      Medication List    TAKE these medications       albuterol (5 MG/ML) 0.5% nebulizer solution  Commonly known as:  PROVENTIL  Take 2.5 mg by nebulization every 6 (six) hours as needed for wheezing or shortness of breath.     HYDROcodone-acetaminophen  5-325 MG per tablet  Commonly known as:  NORCO  Take 1-2 tablets by mouth every 4 (four) hours as needed for pain.             Follow-up Information   Schedule an appointment as soon as possible for a visit with [facility].   Contact information:   [address] [place] [address] [phone]      Call [facility]. (As needed)    Contact information:   [address] [place] [address] [phone]       Signed: Freeman Caldron, PA-C Pager: [phone] General Trauma PA Pager: [phone]  10/24/2012, 8:00 AM

## 2012-10-24 NOTE — Progress Notes (Signed)
Agree Patient examined and I agree with the assessment and plan  Violeta Gelinas, MD, MPH, FACS Pager: [phone]  10/24/2012 10:53 AM

## 2012-10-24 NOTE — Progress Notes (Signed)
Occupational Therapy Treatment Patient Details Name: Christian Hartman MRN: [id number] DOB: [birth year] Today's Date: 10/24/2012 Time: 4098-1191 OT Time Calculation (min): 28 min  OT Assessment / Plan / Recommendation Comments on Treatment Session  Pt with improving cognitive functions - his is able to state reason for hospitalization today, and is able to state that "People have told" him his memory is impaired.  He required mod verbal cues for path finding activity on unit (ambulated off unit to central wing).  He would have continued to wonder and been lost without therapist's input.  Pt and family instructed in BI, pt deficits, need for 24 hour supervision, no skateboarding, and need for continued therapy.  They verbalize understanding.  Pt is eager for discharge.     Follow Up Recommendations  Supervision/Assistance - 24 hour;Outpatient OT    Barriers to Discharge       Equipment Recommendations  None recommended by OT    Recommendations for Other Services    Frequency Min 2X/week   Plan Discharge plan remains appropriate    Precautions / Restrictions Precautions Precautions: Fall Restrictions Weight Bearing Restrictions: No   Pertinent Vitals/Pain     ADL  Upper Body Dressing: Set up;Supervision/safety Where Assessed - Upper Body Dressing: Unsupported sitting Lower Body Dressing: Set up;Supervision/safety Where Assessed - Lower Body Dressing: Unsupported sit to stand Toilet Transfer: Supervision/safety Toilet Transfer Method: Sit to stand Transfers/Ambulation Related to ADLs: supervision.  Pt occasionally staggers, but easily corrects without assistance ADL Comments: Long discussion with pt re: injury, effects of injury and implications for home.  Pt listened and acknowleged need for supervision at home.  He was able to repeat recommendations back to therapist.  Family present and aware of deficits, need for 24 hour supervision - no skateboarding.      OT Diagnosis:    OT  Problem List:   OT Treatment Interventions:     OT Goals Acute Rehab OT Goals OT Goal Formulation: With patient Time For Goal Achievement: 10/28/12 Potential to Achieve Goals: Good ADL Goals Pt Will Perform Grooming: with modified independence;Standing at sink Pt Will Perform Upper Body Bathing: with modified independence;Sitting, chair;Sitting, edge of bed Pt Will Perform Upper Body Dressing: with modified independence;Sitting, chair;Sitting, bed ADL Goal: Upper Body Dressing - Progress: Progressing toward goals Pt Will Perform Lower Body Dressing: with modified independence;Sit to stand from chair;Sit to stand from bed ADL Goal: Lower Body Dressing - Progress: Progressing toward goals Pt Will Transfer to Toilet: with modified independence;Ambulation;Comfort height toilet ADL Goal: Toilet Transfer - Progress: Progressing toward goals Pt Will Perform Toileting - Clothing Manipulation: with modified independence;Standing ADL Goal: Toileting - Clothing Manipulation - Progress: Progressing toward goals Pt Will Perform Toileting - Hygiene: Independently;Sitting on 3-in-1 or toilet Pt Will Perform Tub/Shower Transfer: with modified independence;Ambulation;Tub transfer Additional ADL Goal #1: Pt will perform path finding activity with no more than min cues ADL Goal: Additional Goal #1 - Progress: Progressing toward goals Additional ADL Goal #2: Pt will participate in further visual assesment ADL Goal: Additional Goal #2 - Progress: Met Miscellaneous OT Goals Miscellaneous OT Goal #1: Pt will demonstrate intellectual awareness of deficits with min verbal cues OT Goal: Miscellaneous Goal #1 - Progress: Progressing toward goals  Visit Information  Last OT Received On: 10/24/12 Assistance Needed: +1    Subjective Data      Prior Functioning       Cognition  Cognition Arousal/Alertness: Awake/alert Behavior During Therapy: Flat affect Overall Cognitive Status: Impaired/Different from  baseline Area  of Impairment: Attention;Memory;Safety/judgement;Awareness;Problem solving Orientation Level: Time;Disoriented to (requires use of calendar for date) Current Attention Level: Selective Memory: Decreased short-term memory Following Commands: Follows multi-step commands consistently Safety/Judgement: Decreased awareness of deficits Awareness: Intellectual Problem Solving: Slow processing;Difficulty sequencing;Requires verbal cues General Comments: Pt eager to go home.  Pt. performed path finding activity.  He was able to remember location to find throughout the session, but required mod cues to locate the location and would have completely gotten lost without therapist's assistance.  Pt ambulated off unit (4N) and ambulated to central wing of hospital in search of 4N24.  He finally located room number using trial and error.  He was unable to locate his room afterwards, and was unable to figure out an alternative solution of how to locate it when he couldn't remember his number.  His nurse was outside the room, and cued him that he could ask her, otherwise, pt unable to problem solve through that option.  Pt was, however, able to state reason for admission and that he has been told his memory is not good, but he doesn't feel that his memory is impaired.  long discussion with him re: his deficits and implications for home.   Rancho Levels of Cognitive Functioning Rancho Los Amigos Scales of Cognitive Functioning: Automatic/appropriate    Mobility  Bed Mobility Bed Mobility: Supine to Sit;Sitting - Scoot to Edge of Bed Supine to Sit: 7: Independent Sitting - Scoot to Edge of Bed: 7: Independent Sit to Supine: 7: Independent Transfers Transfers: Sit to Stand;Stand to Sit Sit to Stand: 5: Supervision Stand to Sit: 5: Supervision    Exercises      Balance     End of Session OT - End of Session Activity Tolerance: Patient tolerated treatment well Patient left: in bed;with call  bell/phone within reach;with nursing in room;with family/visitor present Nurse Communication: Other (comment) (ready for discharge)  GO     Treazure Nery M 10/24/2012, 11:51 AM

## 2012-10-31 ENCOUNTER — Ambulatory Visit: Payer: PRIVATE HEALTH INSURANCE | Admitting: Neurology

## 2012-10-31 ENCOUNTER — Encounter (HOSPITAL_COMMUNITY): Payer: Self-pay | Admitting: Emergency Medicine

## 2012-11-01 ENCOUNTER — Ambulatory Visit: Payer: PRIVATE HEALTH INSURANCE | Attending: Orthopedic Surgery | Admitting: Physical Therapy

## 2012-11-05 ENCOUNTER — Ambulatory Visit: Payer: PRIVATE HEALTH INSURANCE | Attending: Orthopedic Surgery | Admitting: Occupational Therapy

## 2012-11-05 ENCOUNTER — Telehealth (HOSPITAL_COMMUNITY): Payer: Self-pay | Admitting: Emergency Medicine

## 2012-11-05 DIAGNOSIS — Y9351 Activity, roller skating (inline) and skateboarding: Secondary | ICD-10-CM | POA: Insufficient documentation

## 2012-11-05 DIAGNOSIS — R4189 Other symptoms and signs involving cognitive functions and awareness: Secondary | ICD-10-CM | POA: Insufficient documentation

## 2012-11-05 DIAGNOSIS — Z5189 Encounter for other specified aftercare: Secondary | ICD-10-CM | POA: Insufficient documentation

## 2012-11-05 DIAGNOSIS — S069X0A Unspecified intracranial injury without loss of consciousness, initial encounter: Secondary | ICD-10-CM | POA: Insufficient documentation

## 2012-11-05 NOTE — Telephone Encounter (Signed)
Mom wanted referral for speech therapy.  Gaither is already going to [facility] on [address] for pt/ot.  I filled out referral form and heather from case management faxed over.

## 2012-11-07 ENCOUNTER — Ambulatory Visit: Payer: PRIVATE HEALTH INSURANCE | Admitting: Occupational Therapy

## 2012-11-08 ENCOUNTER — Ambulatory Visit: Payer: PRIVATE HEALTH INSURANCE

## 2012-11-08 ENCOUNTER — Ambulatory Visit: Payer: PRIVATE HEALTH INSURANCE | Admitting: Physical Therapy

## 2012-11-11 ENCOUNTER — Encounter: Payer: Self-pay | Admitting: Neurology

## 2012-11-11 ENCOUNTER — Ambulatory Visit: Payer: PRIVATE HEALTH INSURANCE

## 2012-11-11 ENCOUNTER — Ambulatory Visit (INDEPENDENT_AMBULATORY_CARE_PROVIDER_SITE_OTHER): Payer: PRIVATE HEALTH INSURANCE | Admitting: Neurology

## 2012-11-11 VITALS — BP 106/66 | HR 88 | Ht 74.0 in | Wt 152.0 lb

## 2012-11-11 DIAGNOSIS — S0990XA Unspecified injury of head, initial encounter: Secondary | ICD-10-CM | POA: Insufficient documentation

## 2012-11-11 DIAGNOSIS — S060X4S Concussion with loss of consciousness of 6 hours to 24 hours, sequela: Secondary | ICD-10-CM

## 2012-11-11 DIAGNOSIS — S069X9S Unspecified intracranial injury with loss of consciousness of unspecified duration, sequela: Secondary | ICD-10-CM

## 2012-11-11 DIAGNOSIS — S0291XS Unspecified fracture of skull, sequela: Secondary | ICD-10-CM

## 2012-11-11 DIAGNOSIS — R561 Post traumatic seizures: Secondary | ICD-10-CM

## 2012-11-11 NOTE — Progress Notes (Signed)
History of present illness:  Mr. Christian Hartman is a 22 years old right-handed African American male, accompanied by his mother, referred by primary care physician Earney Hamburg for followup of his most recent head trauma  He had a skateboarding head trauma in May 18th 2014, she was skating down the road without helmet, cross a speed bump, fell backwards, landed on his occipital region, he had loss of consciousness, passenger called the ambulance, he was taken to the emergency room, reported generalized seizure on the ambulance, and at the emergency room, he was getting Versed, subsequently intubated, he remained intubated for about 24 hours, that was reported agitation, but was not treated with antipeptic medications, EEG at May 20th,  second day of the injury was reported normal.  CT of head showed resolving right occipital pneumocephalus, right temporal squama skull fracture extends into the roof of the mastoid, and is associated with diastasis of the right lambdoid suture. Suspected mild interhemispheric and peritentorial subdural hematomas, stable to slightly improved.   CT temporal bone showed: right squama temporal bone fracture and right lambdoid diastasis. Fracture extends through the most superior/anterior mastoid air cells and inferiorly into the TMJ fossa. Right middle and mastoid hemorrhage. No visible ossicular destruction  He is now overall doing well, has occasionally bilateral occipital area headaches, he denies visual change, no lateralized motor or sensory deficit. No recurrent seizure, he has a history of ADHD, since the injury, he has more impulsive behavior,    Review of Systems  Out of a complete 14 system review, the patient complains of only the following symptoms, and all other reviewed systems are negative.   Constitutional:   fatigue Cardiovascular:  N/A Ear/Nose/Throat:  N/A Skin: N/A Eyes: N/A Respiratory: N/A Gastroitestinal: N/A    Hematology/Lymphatic:   N/A Endocrine:  N/A Musculoskeletal:N/A Allergy/Immunology: N/A Neurological: headache, sleepiness Psychiatric:    N/A  PHYSICAL EXAMINATOINS:  Generalized: In no acute distress  Neck: Supple, no carotid bruits   Cardiac: Regular rate rhythm  Pulmonary: Clear to auscultation bilaterally  Musculoskeletal: No deformity  Neurological examination  Mentation: Alert oriented to time, place, history taking, and causual conversation  Cranial nerve II-XII: Pupils were equal round reactive to light extraocular movements were full, visual field were full on confrontational test. facial sensation and strength were normal. hearing was intact to finger rubbing bilaterally. Uvula tongue midline.  head turning and shoulder shrug and were normal and symmetric.Tongue protrusion into cheek strength was normal.  Motor: normal tone, bulk and strength.  Sensory: Intact to fine touch, pinprick, preserved vibratory sensation, and proprioception at toes.  Coordination: Normal finger to nose, heel-to-shin bilaterally there was no truncal ataxia  Gait: Rising up from seated position without assistance, normal stance, without trunk ataxia, moderate stride, good arm swing, smooth turning, able to perform tiptoe, and heel walking without difficulty.   Romberg signs: Negative  Deep tendon reflexes: Brachioradialis 2/2, biceps 2/2, triceps 2/2, patellar 2/2, Achilles 2/2, plantar responses were flexor bilaterally.  Assessment and plan:  22 years old [place] American male, with history of head trauma, now presenting with occasionally headache, he had a seizure during presentation, no recurrent seizure afterwards.  1. he has normal neurological examination, 2.After discussed with patient and his mother, we decided to hold off further testing such as MRI of the brain, EEG, neuropsychiatric evaluation, 3.Return to clinic in 3 months, if he remains symptomatic, to consider further testing,  4.Avoiding  strenuous exercise, head trauma, no driving on to seizure-free for 6 months .

## 2012-11-13 ENCOUNTER — Ambulatory Visit: Payer: Self-pay

## 2012-11-13 ENCOUNTER — Ambulatory Visit: Payer: PRIVATE HEALTH INSURANCE | Admitting: Occupational Therapy

## 2012-11-15 ENCOUNTER — Ambulatory Visit: Payer: PRIVATE HEALTH INSURANCE | Admitting: Occupational Therapy

## 2012-11-15 ENCOUNTER — Ambulatory Visit: Payer: Self-pay

## 2012-11-18 ENCOUNTER — Ambulatory Visit: Payer: PRIVATE HEALTH INSURANCE | Admitting: Speech Pathology

## 2012-11-18 ENCOUNTER — Ambulatory Visit: Payer: Self-pay | Admitting: Physical Therapy

## 2012-11-18 ENCOUNTER — Ambulatory Visit: Payer: PRIVATE HEALTH INSURANCE | Admitting: Occupational Therapy

## 2012-11-20 ENCOUNTER — Encounter: Payer: Self-pay | Admitting: Occupational Therapy

## 2012-11-20 ENCOUNTER — Ambulatory Visit: Payer: Self-pay | Admitting: Physical Therapy

## 2012-11-22 ENCOUNTER — Ambulatory Visit: Payer: PRIVATE HEALTH INSURANCE | Admitting: Occupational Therapy

## 2012-11-25 ENCOUNTER — Ambulatory Visit: Payer: Self-pay

## 2012-11-25 ENCOUNTER — Ambulatory Visit: Payer: PRIVATE HEALTH INSURANCE | Admitting: Occupational Therapy

## 2012-11-29 ENCOUNTER — Ambulatory Visit: Payer: Self-pay | Admitting: Physical Therapy

## 2012-11-29 ENCOUNTER — Ambulatory Visit: Payer: PRIVATE HEALTH INSURANCE | Admitting: Occupational Therapy

## 2012-12-11 ENCOUNTER — Encounter: Payer: Self-pay | Admitting: Neurology

## 2012-12-13 ENCOUNTER — Telehealth: Payer: Self-pay | Admitting: Neurology

## 2012-12-13 NOTE — Telephone Encounter (Signed)
Called and spoke to patient. Patient cell phone was breaking up really bad. I asked patient if he was ok patient stated he was fine he was at the bus stop and he would call back.

## 2012-12-16 NOTE — Telephone Encounter (Signed)
LMVM for pts friend, returning call.

## 2012-12-17 ENCOUNTER — Telehealth: Payer: Self-pay

## 2012-12-17 NOTE — Telephone Encounter (Signed)
Called and spoke to patients mother Christian Hartman. [name] came to her and he stated that he feels really sad. Patients mother called his PCP and his PCP told him to call Dr.Yan. Dr.Yan advise me Does he need a follow up ?

## 2012-12-17 NOTE — Telephone Encounter (Signed)
Chart, reviewed, patient has head trauma, now complains of depression, please advise patient and his mother to psychiatrist for evaluations

## 2012-12-18 NOTE — Telephone Encounter (Signed)
I called and spoke to [facility] and told him that pcp could assist in referring pt for psychology or psychiatry. He verbalized understanding.  Would contact them

## 2013-03-03 ENCOUNTER — Ambulatory Visit: Payer: PRIVATE HEALTH INSURANCE | Admitting: Neurology

## 2013-03-27 ENCOUNTER — Ambulatory Visit (INDEPENDENT_AMBULATORY_CARE_PROVIDER_SITE_OTHER): Payer: PRIVATE HEALTH INSURANCE | Admitting: Neurology

## 2013-03-27 ENCOUNTER — Encounter: Payer: Self-pay | Admitting: Neurology

## 2013-03-27 VITALS — BP 118/60 | HR 59 | Ht 74.0 in | Wt 159.0 lb

## 2013-03-27 DIAGNOSIS — R569 Unspecified convulsions: Secondary | ICD-10-CM

## 2013-03-27 MED ORDER — DIVALPROEX SODIUM ER 500 MG PO TB24: 500.0000 mg | ORAL_TABLET | Freq: Every day | ORAL | Status: DC

## 2013-03-27 NOTE — Progress Notes (Signed)
History of present illness:  Mr. Tindol is a 22 years old right-handed African American male, accompanied by his mother, referred by primary care physician Earney Hamburg for followup of his most recent head trauma  He had a skateboarding head trauma in May 18th 2014, she was skating down the road without helmet, cross a speed bump, fell backwards, landed on his occipital region, he had loss of consciousness, passenger called the ambulance, he was taken to the emergency room, reported generalized seizure on the ambulance, and at the emergency room, he was getting Versed, subsequently intubated, he remained intubated for about 24 hours, that was reported agitation, but was not treated with antipeptic medications, EEG at May 20th,  second day of the injury was reported normal.  CT of head showed resolving right occipital pneumocephalus, right temporal squama skull fracture extends into the roof of the mastoid, and is associated with diastasis of the right lambdoid suture. Suspected mild interhemispheric and peritentorial subdural hematomas, stable to slightly improved.   CT temporal bone showed: right squama temporal bone fracture and right lambdoid diastasis. Fracture extends through the most superior/anterior mastoid air cells and inferiorly into the TMJ fossa. Right middle and mastoid hemorrhage. No visible ossicular destruction  He is now overall doing well, has occasionally bilateral occipital area headaches, he denies visual change, no lateralized motor or sensory deficit. No recurrent seizure, he has a history of ADHD, since the injury, he has more impulsive behavior,   Update October 23rd 2014: He reported recurrent episode of metallic taste in his mouth, 1-2 episode each day, lasting a few seconds, he also has lost sense of smell, he denied generalized seizure, he has no gait difficulty, no visual loss.   Review of Systems  Out of a complete 14 system review, the patient complains of only  the following symptoms, and all other reviewed systems are negative.  Fatigue, shortness of breath, wheezing, memory loss, insomnia, difficulty swallowing, depression, decreased energy, disinterested in activities, racing thoughts,  PHYSICAL EXAMINATOINS:  Generalized: In no acute distress  Neck: Supple, no carotid bruits   Cardiac: Regular rate rhythm  Pulmonary: Clear to auscultation bilaterally  Musculoskeletal: No deformity  Neurological examination  Mentation: Alert oriented to time, place, history taking, and causual conversation  Cranial nerve II-XII: Pupils were equal round reactive to light extraocular movements were full, visual field were full on confrontational test. facial sensation and strength were normal. hearing was intact to finger rubbing bilaterally. Uvula tongue midline.  head turning and shoulder shrug and were normal and symmetric.Tongue protrusion into cheek strength was normal.  Motor: normal tone, bulk and strength.  Sensory: Intact to fine touch, pinprick, preserved vibratory sensation, and proprioception at toes.  Coordination: Normal finger to nose, heel-to-shin bilaterally there was no truncal ataxia  Gait: Rising up from seated position without assistance, normal stance, without trunk ataxia, moderate stride, good arm swing, smooth turning, able to perform tiptoe, and heel walking without difficulty.   Romberg signs: Negative  Deep tendon reflexes: Brachioradialis 2/2, biceps 2/2, triceps 2/2, patellar 2/2, Achilles 2/2, plantar responses were flexor bilaterally.  Assessment and plan:  22 years old [place] American male, with history of head trauma, now presenting with occasionally headache, he had a seizure during presentation, now presenting with episode of recurrent metallic taste in his mouth, 1-2 episode each day.  1. Most consistent with partial seizure, proceed with MRI of the brain with and without contrast 2.  EEG 3.  Depakote 500 mg every  night  4.  Return to clinic in 6 months with Eber Jones.

## 2013-04-02 ENCOUNTER — Ambulatory Visit (INDEPENDENT_AMBULATORY_CARE_PROVIDER_SITE_OTHER): Payer: PRIVATE HEALTH INSURANCE | Admitting: Radiology

## 2013-04-02 DIAGNOSIS — R569 Unspecified convulsions: Secondary | ICD-10-CM

## 2013-04-07 ENCOUNTER — Encounter: Payer: Self-pay | Admitting: Neurology

## 2013-04-09 ENCOUNTER — Ambulatory Visit (INDEPENDENT_AMBULATORY_CARE_PROVIDER_SITE_OTHER): Payer: PRIVATE HEALTH INSURANCE

## 2013-04-09 DIAGNOSIS — R569 Unspecified convulsions: Secondary | ICD-10-CM

## 2013-04-10 ENCOUNTER — Other Ambulatory Visit: Payer: Self-pay

## 2013-04-14 NOTE — Progress Notes (Signed)
Quick Note:  Please call patient, there is scar in his brain consistent with previous trauma. No change in treatment plan.  Subtle encephalomalacia and gliosis in the anterior, inferior bifrontal  and left anterior temporal lobes, consistent with prior trauma. Mild non-specific, hazy periventricular gliosis.   ______

## 2013-04-15 NOTE — Procedures (Signed)
HISTORY: 22 yo Male had a skateboarding head trauma in May 18th 2014, she was skating down the road without helmet, cross a speed bump, fell backwards, landed on his occipital region, he had loss of consciousness, passenger called the ambulance, he was taken to the emergency room, reported generalized seizure on the ambulance, and at the emergency room, he was getting Versed, subsequently intubated, he remained intubated for about 24 hours, that was reported agitation, but was not treated with antipeptic medications, EEG at May 20th, second day of the injury was reported normal.  TECHNIQUE:  16 channel EEG was performed based on standard 10-16 international system. One channel was dedicated to EKG, which has demonstrates normal sinus rhythm of 66 beats per minutes.  Upon awakening, the posterior background activity was well-developed, in alpha range,9-10 Hz, reactive to eye opening and closure.  There was no evidence of epilepsy for discharge.  Photic stimulation was performed, which induced a symmetric photic driving.  Hyperventilation was not performed No sleep was achieved.  CONCLUSION: This is a  normal awake EEG.  There is no electrodiagnostic evidence of epileptiform discharges

## 2013-04-16 NOTE — Progress Notes (Signed)
Quick Note:  Spoke to patient's mother and relayed MRI brain results, per Dr. Terrace Arabia. Also relayed normal awake EEG. ______

## 2013-09-24 ENCOUNTER — Telehealth: Payer: Self-pay | Admitting: *Deleted

## 2013-09-25 ENCOUNTER — Ambulatory Visit: Payer: PRIVATE HEALTH INSURANCE | Admitting: Nurse Practitioner

## 2013-11-10 ENCOUNTER — Ambulatory Visit: Payer: Self-pay | Admitting: Nurse Practitioner

## 2013-11-10 ENCOUNTER — Telehealth: Payer: Self-pay | Admitting: Nurse Practitioner

## 2013-11-10 NOTE — Telephone Encounter (Signed)
No-shows for scheduled appointment 

## 2014-01-08 NOTE — Telephone Encounter (Signed)
error 

## 2014-01-09 ENCOUNTER — Ambulatory Visit (INDEPENDENT_AMBULATORY_CARE_PROVIDER_SITE_OTHER): Payer: BC Managed Care – HMO | Admitting: Nurse Practitioner

## 2014-01-09 ENCOUNTER — Encounter: Payer: Self-pay | Admitting: Nurse Practitioner

## 2014-01-09 VITALS — BP 110/68 | HR 73 | Temp 99.0°F | Ht 74.0 in | Wt 156.0 lb

## 2014-01-09 DIAGNOSIS — F329 Major depressive disorder, single episode, unspecified: Secondary | ICD-10-CM

## 2014-01-09 DIAGNOSIS — R4587 Impulsiveness: Secondary | ICD-10-CM | POA: Insufficient documentation

## 2014-01-09 DIAGNOSIS — IMO0002 Reserved for concepts with insufficient information to code with codable children: Secondary | ICD-10-CM

## 2014-01-09 DIAGNOSIS — R569 Unspecified convulsions: Secondary | ICD-10-CM

## 2014-01-09 DIAGNOSIS — G4700 Insomnia, unspecified: Secondary | ICD-10-CM

## 2014-01-09 DIAGNOSIS — Z87828 Personal history of other (healed) physical injury and trauma: Secondary | ICD-10-CM

## 2014-01-09 DIAGNOSIS — F32A Depression, unspecified: Secondary | ICD-10-CM

## 2014-01-09 DIAGNOSIS — F3289 Other specified depressive episodes: Secondary | ICD-10-CM

## 2014-01-09 DIAGNOSIS — R451 Restlessness and agitation: Secondary | ICD-10-CM

## 2014-01-09 NOTE — Progress Notes (Signed)
PATIENT: Christian Hartman DOB: [birth year]  REASON FOR VISIT: routine follow up for post-traumatic seizure HISTORY FROM: patient  HISTORY OF PRESENT ILLNESS: Christian Hartman is a 23 years old right-handed African American male, accompanied by his mother, referred by Earney Hamburg, [place] for followup of his most recent head trauma.  He had a skateboarding head trauma in May 18th 2014, he was skating down the road without helmet, crossed a speed bump, fell backwards, landed on his occipital region.  He had loss of consciousness, passenger called the ambulance, he was taken to the emergency room, reported generalized seizure in the ambulance, and at the emergency room, he was getting Versed, subsequently intubated, he remained intubated for about 24 hours.  There was reported agitation, but was not treated with antileptic medications, EEG on May 20th 2014, second day of the injury was reported normal.  CT of head showed resolving right occipital pneumocephalus, right temporal squama skull fracture extends into the roof of the mastoid, and is associated with diastasis of the right lambdoid suture. Suspected mild interhemispheric and peritentorial subdural hematomas, stable to slightly improved.  CT temporal bone showed: right squama temporal bone fracture and right lambdoid diastasis. Fracture extends through the most superior/anterior mastoid air cells and inferiorly into the TMJ fossa. Right middle and mastoid hemorrhage. No visible ossicular destruction  He is now overall doing well, has occasionally bilateral occipital area headaches. He denies visual change, no lateralized motor or sensory deficit. No recurrent seizure, he has a history of ADHD, since the injury, he has more impulsive behavior.  Update October 23rd 2014 (YY):  He reported recurrent episode of metallic taste in his mouth, 1-2 episode each day, lasting a few seconds, he also has lost sense of smell, he denied generalized seizure, he has no  gait difficulty, no visual loss.   Update January 09, 2014 (LL):  Patient returns for new problem.  He is accompanied by his mother and step-father.  He stopped taking Depakote ER on his own several months ago and has not had any known seizures, but sometimes does have hallucination of bad smells, although his sense of smell is gone. Mother speaks of impulsive and aggressive behavior, son's personality is very different since before head trauma. Little things may "set him off," making him very angry and defiant. He says mean and hurtful things when angry, thing he would not have said before accident. He has depressive feelings, states he is very sad at times. Of note, patient's mother called in July 2014 requesting referral to Psychiatry, PCP told mother to see neurologist. He was then deferred to PCP for referral.  He has no PCP. Needless to say, referral was never made. Patient circled "suicidal feelings" on intake sheet today, but denies any plan to hurt himself or others. Mother wants help for son, doesn't know what to do.  Review of Systems  Out of a complete 14 system review, the patient complains of only the following symptoms, and all other reviewed systems are negative.  Fatigue, shortness of breath, wheezing, memory loss, insomnia, difficulty swallowing, depression, decreased energy, disinterested in activities, racing thoughts,   ALLERGIES: No Known Allergies  HOME MEDICATIONS: Outpatient Prescriptions Prior to Visit  Medication Sig Dispense Refill  . albuterol (PROVENTIL) (5 MG/ML) 0.5% nebulizer solution Take 2.5 mg by nebulization every 6 (six) hours as needed for wheezing or shortness of breath.       PHYSICAL EXAM Filed Vitals:   01/09/14 1509  BP: 110/68  Pulse:  73  Temp: 99 F (37.2 C)  TempSrc: Oral  Height: 6\' 2"  (1.88 m)  Weight: 156 lb (70.761 kg)   Body mass index is 20.02 kg/(m^2).  Generalized: Well developed, in no acute distress  Head: normocephalic and  atraumatic. Oropharynx benign  Neck: Supple, no carotid bruits  Cardiac: Regular rate rhythm, no murmur  Musculoskeletal: No deformity   Neurological examination  Mentation: Alert oriented to time, place, history taking. Follows all commands speech and language fluent Cranial nerve II-XII: Fundoscopic exam not done. Pupils were equal round reactive to light extraocular movements were full, visual field were full on confrontational test. Facial sensation and strength were normal. hearing was intact to finger rubbing bilaterally. Uvula tongue midline. head turning and shoulder shrug and were normal and symmetric.Tongue protrusion into cheek strength was normal. Motor: The motor testing reveals 5 over 5 strength of all 4 extremities. Good symmetric motor tone is noted throughout.  Sensory: Sensory testing is intact to soft touch on all 4 extremities. No evidence of extinction is noted.  Coordination: normal Gait and station: Gait is normal.  Reflexes: Deep tendon reflexes are symmetric and normal bilaterally.   DIAGNOSTIC DATA (LABS, IMAGING, TESTING) - I reviewed patient records, labs, notes, testing and imaging myself where available.  04/09/13 Abnormal MRI brain (without) demonstrating:  1. Subtle encephalomalacia and gliosis in the anterior, inferior bifrontal and left anterior temporal lobes, consistent with prior trauma.  2. Mild non-specific, hazy periventricular gliosis.  3. Right temporal bone fracture from prior CT studies (May 2014) not visualized on current MRI, which is less sensitive for bone fracture pathologies. Also prior possible subdural hematomas no longer visualized.  04/15/13 EEG: This is a normal awake EEG.  ASSESSMENT: 23 y.o. African American male, with history of head trauma, now presenting with behavioral problems of depression, agitation and impulsivity.  He had a seizure during presentation for head trauma, now has intermittent episodes of olfactory hallucination.  Unclear if this is partial seizure. Has not been compliant with seizure medication.  He does not drive.  PLAN: Referral to Psychiatry, [facility], Dr. Elmore Guiseenisse Ambler, MD for depression with agitation, impulsiveness. Instructed if having suicidal or homicidal thoughts, please call 911 or [facility] for immediate treatment.  Advised to restart seizure medication if has any more seizures. No driving. Wear helmet when skateboarding! Return to clinic as needed.  Orders Placed This Encounter  Procedures  . Ambulatory referral to Psychiatry  Return if symptoms worsen or fail to improve.  Tawny AsalLYNN E. Vishwa Dais, MSN, FNP-BC, A/GNP-C 01/09/2014, 4:59 PM [facility] [address], [address] [place], [place] [address] [phone]  Note: This document was prepared with digital dictation and possible smart phrase technology. Any transcriptional errors that result from this process are unintentional.

## 2014-01-09 NOTE — Patient Instructions (Addendum)
We are referring you to Psychiatry, [facility], Someone should contact you for an appointment within the next week.   In the meantime, if you are having suicidal or homicidal thoughts, please call 911 or go to [facility] Emergency Room for immediate treatment.  You are advised to restart your seizure medication if you have any more seizures.  Call our office with any problems or questions.  Traumatic Brain Injury Traumatic brain injury (TBI) occurs when an injury to the head causes the brain to move back and forth. The risk of brain injury varies with the severity of the trauma. The damage can be confined to one area of the brain (focal) or involve different areas of the brain (diffuse). The severity of a brain injury can range from:  A blow or jolt to the head that disrupts the normal function of the brain (concussion).  A deep state of unconsciousness (coma).  Death. CAUSES  A brain injury can result from:  A closed head injury. This occurs when the head suddenly and violently hits an object but the object does not break through the skull. Examples include:  A direct blow (hitting your head on a hard surface).  An indirect blow (when your head moves rapidly and violently back and forth, like in a car crash). This injury is called contrecoup (involving a blow and counter blow). Shaken baby syndrome is a severe type of this injury. It happens when a baby is shaken forcibly enough to cause extreme contrecoup injury.  Penetrating head injury. A penetrating head injury occurs when an object pierces the skull and enters the brain tissue. Examples include:  A skull fracture occurs when the skull cracks or breaks.  A depressed skull fracture occurs when pieces of the broken skull press into the tissue of the brain. This can cause bruising of the brain tissue called a contusion. In both closed and penetrating head injuries, damage to blood vessels can cause heavy bleeding  into or around the brain.  SYMPTOMS  The symptoms of a TBI depend on the type and severity of the injury. The most common symptoms include:   Confusion (disorientation) or other thinking problems.  An inability to remember events around the time of the injury (amnesia).  Difficulty staying awake or passing out (loss of consciousness).  Difficulty maintaining your balance or feeling unsteady.  Slow reaction time.  Difficulty learning or remembering things you have heard.  Headache.  Blurry vision.  Vomiting.  Seizures.  Swelling of the scalp. This occurs because of bleeding or swelling under the skin of the skull when the head is hit. TREATMENT Treatment of traumatic brain injury can involve a range of different medical options:  If a brain injury is moderate to severe, a hospital stay will be necessary to monitor:  Neurological status.  Pressure or swelling of the brain (intracranial pressure).  For seizures.  Severe brain injury cases may need surgery to:  Control bleeding.  Relieve pressure on the brain.  Remove objects from the brain that result from a penetrating injury.  Repair the skull from an injury.  Long-term treatment of a brain injury can involve rehabilitation work such as:  Physical therapy.  Occupational therapy.  Speech therapy. PROGNOSIS The outcome of TBI depends on the cause of the injury, location, severity, and extent of neurological damage. Outcomes range from good recovery to death. Long term consequences of a TBI can include:  Difficulty concentrating or having a short attention span.  Change in personality.  Irritability.  Headaches.  Blurry vision.  Sleepiness.  Depression.  Unsteadiness that makes walking or standing hard to do. For more information and support, contact: [employer] [facility] [employer].  Document Released: 05/12/2002 Document Revised: 03/12/2013 Document Reviewed:  09/04/2013 [facility] Patient Information 2015 [employer] [place]. This information is not intended to replace advice given to you by your health care provider. Make sure you discuss any questions you have with your health care provider.

## 2014-02-05 ENCOUNTER — Other Ambulatory Visit: Payer: Self-pay | Admitting: Nurse Practitioner

## 2014-02-05 ENCOUNTER — Telehealth: Payer: Self-pay | Admitting: Nurse Practitioner

## 2014-02-05 DIAGNOSIS — G4700 Insomnia, unspecified: Secondary | ICD-10-CM

## 2014-02-05 DIAGNOSIS — S069X1S Unspecified intracranial injury with loss of consciousness of 30 minutes or less, sequela: Secondary | ICD-10-CM

## 2014-02-05 DIAGNOSIS — R4587 Impulsiveness: Secondary | ICD-10-CM

## 2014-02-05 DIAGNOSIS — F3289 Other specified depressive episodes: Secondary | ICD-10-CM

## 2014-02-05 DIAGNOSIS — IMO0002 Reserved for concepts with insufficient information to code with codable children: Secondary | ICD-10-CM

## 2014-02-05 DIAGNOSIS — F329 Major depressive disorder, single episode, unspecified: Secondary | ICD-10-CM

## 2014-02-05 DIAGNOSIS — R451 Restlessness and agitation: Secondary | ICD-10-CM

## 2014-02-05 NOTE — Telephone Encounter (Signed)
Mother checking status of referral to Psychiatry, [facility]. Please return call anytime and may leave detailed message on voice mail.

## 2014-02-10 NOTE — Telephone Encounter (Signed)
Pt's referral was put in already, per RichedaBeasleyntucky.

## 2014-03-09 ENCOUNTER — Telehealth: Payer: Self-pay | Admitting: Nurse Practitioner

## 2014-03-09 NOTE — Telephone Encounter (Signed)
Patient's friend calling to state that they still have not been contacted by the psychologist that patient was referred to, please return call and advise.

## 2014-03-10 NOTE — Telephone Encounter (Signed)
Spoke with mother(person who called) and she said that a referral was given to patient at last appointment, was misplaced and would like the number of the psychiatrist where the patient may be seen. Called and schedule consultation with [facility]-Dr Charlton HawsAkahtar [facility]([place] [facility] [place] [facility] is not taking new patients at this time) for patient (03/24/14@10 :00,arrival time is :9:30, left message for patient to return call for appt.time.

## 2014-03-10 NOTE — Telephone Encounter (Signed)
Elmo PuttDana Gardner [phone] waiting for a call back - okay to leave message at her number.

## 2014-03-10 NOTE — Telephone Encounter (Signed)
Called and spoke with mother and informed of patient's appointment, with [facility] [place], gave phone [phone]) if patient needs to reschedule,she verbalized understanding and said ok,thank you.Referral was faxed and confirmation received.

## 2014-03-24 ENCOUNTER — Ambulatory Visit (INDEPENDENT_AMBULATORY_CARE_PROVIDER_SITE_OTHER): Payer: BC Managed Care – PPO | Admitting: Psychiatry

## 2014-03-24 ENCOUNTER — Encounter (HOSPITAL_COMMUNITY): Payer: Self-pay | Admitting: Psychiatry

## 2014-03-24 VITALS — BP 111/67 | HR 83 | Ht 74.5 in | Wt 163.0 lb

## 2014-03-24 DIAGNOSIS — F1220 Cannabis dependence, uncomplicated: Secondary | ICD-10-CM | POA: Insufficient documentation

## 2014-03-24 DIAGNOSIS — F0630 Mood disorder due to known physiological condition, unspecified: Secondary | ICD-10-CM

## 2014-03-24 DIAGNOSIS — S069X9S Unspecified intracranial injury with loss of consciousness of unspecified duration, sequela: Secondary | ICD-10-CM

## 2014-03-24 DIAGNOSIS — S069XAS Unspecified intracranial injury with loss of consciousness status unknown, sequela: Secondary | ICD-10-CM | POA: Insufficient documentation

## 2014-03-24 DIAGNOSIS — F0781 Postconcussional syndrome: Secondary | ICD-10-CM | POA: Insufficient documentation

## 2014-03-24 DIAGNOSIS — S069X0S Unspecified intracranial injury without loss of consciousness, sequela: Secondary | ICD-10-CM

## 2014-03-24 DIAGNOSIS — F39 Unspecified mood [affective] disorder: Secondary | ICD-10-CM

## 2014-03-24 MED ORDER — CITALOPRAM HYDROBROMIDE 10 MG PO TABS: 10.0000 mg | ORAL_TABLET | Freq: Every day | ORAL | Status: DC
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
## 2014-03-24 NOTE — Patient Instructions (Signed)
Stop marijuana for medication to be effective.

## 2014-04-14 ENCOUNTER — Encounter (HOSPITAL_COMMUNITY): Payer: Self-pay | Admitting: Psychiatry

## 2014-04-14 ENCOUNTER — Ambulatory Visit (INDEPENDENT_AMBULATORY_CARE_PROVIDER_SITE_OTHER): Payer: BC Managed Care – PPO | Admitting: Psychiatry

## 2014-04-14 VITALS — HR 90 | Ht 75.0 in | Wt 164.0 lb

## 2014-04-14 DIAGNOSIS — S069X9S Unspecified intracranial injury with loss of consciousness of unspecified duration, sequela: Secondary | ICD-10-CM

## 2014-04-14 DIAGNOSIS — F39 Unspecified mood [affective] disorder: Secondary | ICD-10-CM

## 2014-04-14 DIAGNOSIS — F1220 Cannabis dependence, uncomplicated: Secondary | ICD-10-CM

## 2014-04-14 DIAGNOSIS — S069X0S Unspecified intracranial injury without loss of consciousness, sequela: Secondary | ICD-10-CM

## 2014-04-14 DIAGNOSIS — F0781 Postconcussional syndrome: Secondary | ICD-10-CM

## 2014-04-14 DIAGNOSIS — F0630 Mood disorder due to known physiological condition, unspecified: Secondary | ICD-10-CM

## 2014-04-14 MED ORDER — CITALOPRAM HYDROBROMIDE 10 MG PO TABS: 15.0000 mg | ORAL_TABLET | Freq: Every day | ORAL | Status: DC
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
## 2014-04-24 ENCOUNTER — Ambulatory Visit (HOSPITAL_COMMUNITY): Payer: Self-pay | Admitting: Licensed Clinical Social Worker

## 2014-05-14 ENCOUNTER — Ambulatory Visit (INDEPENDENT_AMBULATORY_CARE_PROVIDER_SITE_OTHER): Payer: BC Managed Care – PPO | Admitting: Licensed Clinical Social Worker

## 2014-05-14 ENCOUNTER — Encounter (HOSPITAL_COMMUNITY): Payer: Self-pay | Admitting: Licensed Clinical Social Worker

## 2014-05-14 DIAGNOSIS — F902 Attention-deficit hyperactivity disorder, combined type: Secondary | ICD-10-CM

## 2014-05-14 DIAGNOSIS — F0781 Postconcussional syndrome: Secondary | ICD-10-CM

## 2014-05-14 DIAGNOSIS — Z720 Tobacco use: Secondary | ICD-10-CM

## 2014-05-14 DIAGNOSIS — F1220 Cannabis dependence, uncomplicated: Secondary | ICD-10-CM
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
## 2014-06-10 ENCOUNTER — Ambulatory Visit (INDEPENDENT_AMBULATORY_CARE_PROVIDER_SITE_OTHER): Payer: BLUE CROSS/BLUE SHIELD | Admitting: Licensed Clinical Social Worker

## 2014-06-10 DIAGNOSIS — F0781 Postconcussional syndrome: Secondary | ICD-10-CM
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
## 2014-06-11 ENCOUNTER — Ambulatory Visit (INDEPENDENT_AMBULATORY_CARE_PROVIDER_SITE_OTHER): Payer: BLUE CROSS/BLUE SHIELD | Admitting: Psychiatry

## 2014-06-11 ENCOUNTER — Encounter (HOSPITAL_COMMUNITY): Payer: Self-pay | Admitting: Psychiatry

## 2014-06-11 VITALS — BP 120/67 | HR 65 | Ht 75.0 in | Wt 161.0 lb

## 2014-06-11 DIAGNOSIS — F1220 Cannabis dependence, uncomplicated: Secondary | ICD-10-CM

## 2014-06-11 DIAGNOSIS — F39 Unspecified mood [affective] disorder: Secondary | ICD-10-CM

## 2014-06-11 DIAGNOSIS — F0781 Postconcussional syndrome: Secondary | ICD-10-CM

## 2014-06-11 DIAGNOSIS — S069X9S Unspecified intracranial injury with loss of consciousness of unspecified duration, sequela: Secondary | ICD-10-CM

## 2014-06-11 DIAGNOSIS — S069XAS Unspecified intracranial injury with loss of consciousness status unknown, sequela: Secondary | ICD-10-CM

## 2014-06-11 DIAGNOSIS — F0630 Mood disorder due to known physiological condition, unspecified: Secondary | ICD-10-CM

## 2014-06-11 MED ORDER — CITALOPRAM HYDROBROMIDE 20 MG PO TABS: 20.0000 mg | ORAL_TABLET | Freq: Every day | ORAL | Status: DC
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
## 2014-06-24 ENCOUNTER — Ambulatory Visit (HOSPITAL_COMMUNITY): Payer: Self-pay | Admitting: Licensed Clinical Social Worker

## 2014-07-08 ENCOUNTER — Ambulatory Visit (HOSPITAL_COMMUNITY): Payer: Self-pay | Admitting: Licensed Clinical Social Worker

## 2014-07-22 ENCOUNTER — Ambulatory Visit (HOSPITAL_COMMUNITY): Payer: Self-pay | Admitting: Licensed Clinical Social Worker

## 2014-08-10 ENCOUNTER — Ambulatory Visit (HOSPITAL_COMMUNITY): Payer: Self-pay | Admitting: Psychiatry

## 2014-08-13 ENCOUNTER — Ambulatory Visit (HOSPITAL_COMMUNITY): Payer: Self-pay | Admitting: Psychiatry

## 2014-08-17 ENCOUNTER — Ambulatory Visit (HOSPITAL_COMMUNITY): Payer: Self-pay | Admitting: Psychiatry

## 2014-08-20 ENCOUNTER — Ambulatory Visit (INDEPENDENT_AMBULATORY_CARE_PROVIDER_SITE_OTHER): Payer: BLUE CROSS/BLUE SHIELD | Admitting: Licensed Clinical Social Worker

## 2014-08-20 DIAGNOSIS — F0781 Postconcussional syndrome: Secondary | ICD-10-CM

## 2014-08-21 ENCOUNTER — Ambulatory Visit (INDEPENDENT_AMBULATORY_CARE_PROVIDER_SITE_OTHER): Payer: BLUE CROSS/BLUE SHIELD | Admitting: Psychiatry

## 2014-08-21 ENCOUNTER — Encounter (HOSPITAL_COMMUNITY): Payer: Self-pay | Admitting: Psychiatry

## 2014-08-21 VITALS — HR 82 | Ht 75.0 in | Wt 163.0 lb

## 2014-08-21 DIAGNOSIS — F0630 Mood disorder due to known physiological condition, unspecified: Secondary | ICD-10-CM

## 2014-08-21 DIAGNOSIS — F1220 Cannabis dependence, uncomplicated: Secondary | ICD-10-CM

## 2014-08-21 DIAGNOSIS — F39 Unspecified mood [affective] disorder: Secondary | ICD-10-CM

## 2014-08-21 DIAGNOSIS — F0781 Postconcussional syndrome: Secondary | ICD-10-CM | POA: Diagnosis not present

## 2014-08-21 DIAGNOSIS — S069X9S Unspecified intracranial injury with loss of consciousness of unspecified duration, sequela: Secondary | ICD-10-CM

## 2014-08-21 DIAGNOSIS — F1290 Cannabis use, unspecified, uncomplicated: Secondary | ICD-10-CM | POA: Diagnosis not present

## 2014-08-21 MED ORDER — CITALOPRAM HYDROBROMIDE 20 MG PO TABS: 20.0000 mg | ORAL_TABLET | Freq: Every day | ORAL | Status: DC

## 2014-08-21 MED ORDER — BUPROPION HCL 100 MG PO TABS: 100.0000 mg | ORAL_TABLET | Freq: Every day | ORAL | Status: DC

## 2014-08-21 NOTE — Psych (Signed)
   THERAPIST PROGRESS NOTE  Session Time: 3:10-4:00pm  Participation Level: Active  Behavioral Response: Well GroomedAlertEuthymic  Type of Therapy: Individual Therapy  Treatment Goals addressed: increase socialization    Interventions: Mindfulness  Suicidal/Homicidal: Denies both  Therapist Interventions: Gathered information about significant events and changes in mood and functioning since last seen in early January.  Discussed general lack of motivation to pursue employment.  Introduced the concept of mindfulness after patient described his difficulties with staying focused on tasks.  Provided him with some reading material about mindfulness.           Summary:    Reported he had been socializing with friends fairly often but there has been a noticeable decline in the past few weeks.  He identified some barriers to obtaining employment.  He does not have a working phone so he uses his girlfriend's phone number as a means of contact.  He does not have a drivers license.  Relies on friends for transportation.  Has completed applications for some jobs, but has not gotten any responses.  Reports he has given up on the idea of [employer].     Admits that he worries a couple of hours each day.  His mind races.  Reports he often feels like he doesn't want to sleep.  Last night only slept 2-3 hours.   Expressed some interest in mindfulness.  Reported trying a meditation exercise recently. Admits that he continues to use marijuana regularly.   Noted that he would like to talk to the psychiatrist about being prescribed something to treat his ADHD.                  Plan: Agreed to schedule therapy sessions once a month.  Was able to schedule a medication management appointment for tomorrow.  Diagnosis:   Post Concussion Syndrome with depressed mood        Darrin LuisSolomon, Henna Derderian A, LCSW 08/21/2014
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
## 2014-09-20 ENCOUNTER — Other Ambulatory Visit (HOSPITAL_COMMUNITY): Payer: Self-pay | Admitting: Psychiatry

## 2014-10-20 ENCOUNTER — Ambulatory Visit (INDEPENDENT_AMBULATORY_CARE_PROVIDER_SITE_OTHER): Payer: BLUE CROSS/BLUE SHIELD | Admitting: Psychiatry

## 2014-10-20 ENCOUNTER — Ambulatory Visit (INDEPENDENT_AMBULATORY_CARE_PROVIDER_SITE_OTHER): Payer: BLUE CROSS/BLUE SHIELD | Admitting: Licensed Clinical Social Worker

## 2014-10-20 DIAGNOSIS — S069X0S Unspecified intracranial injury without loss of consciousness, sequela: Secondary | ICD-10-CM | POA: Diagnosis not present

## 2014-10-20 DIAGNOSIS — F1220 Cannabis dependence, uncomplicated: Secondary | ICD-10-CM

## 2014-10-20 DIAGNOSIS — F39 Unspecified mood [affective] disorder: Secondary | ICD-10-CM

## 2014-10-20 DIAGNOSIS — F0781 Postconcussional syndrome: Secondary | ICD-10-CM

## 2014-10-20 DIAGNOSIS — S069X9S Unspecified intracranial injury with loss of consciousness of unspecified duration, sequela: Secondary | ICD-10-CM

## 2014-10-20 DIAGNOSIS — F0630 Mood disorder due to known physiological condition, unspecified: Secondary | ICD-10-CM

## 2014-10-20 DIAGNOSIS — F1290 Cannabis use, unspecified, uncomplicated: Secondary | ICD-10-CM

## 2014-10-20 DIAGNOSIS — S069XAS Unspecified intracranial injury with loss of consciousness status unknown, sequela: Secondary | ICD-10-CM

## 2014-10-20 MED ORDER — BUPROPION HCL 100 MG PO TABS: 100.0000 mg | ORAL_TABLET | Freq: Every day | ORAL | Status: DC

## 2014-10-20 MED ORDER — CITALOPRAM HYDROBROMIDE 20 MG PO TABS: 20.0000 mg | ORAL_TABLET | Freq: Every day | ORAL | Status: DC

## 2014-10-20 NOTE — Progress Notes (Signed)
Patient ID: Christian Hartman, male   DOB: 04/24/1991, 24 y.o.   MRN: [id number]  [facility]  Christian Hartman [id number] 24 y.o.  10/20/2014 3:40 PM  Chief Complaint:  Follow up   History of Present Illness:   Patient Presents for follow up medication management of behavioral issues and agitation. Marijuana use disorder and Mood disorder unspecified. Christian Hartman initially has  had a skateboarding accident after which he was in medical induced coma for a week as he was having seizures. He has been on depakote in past and is referred by neurologist for his recent increase in behavioral problems and agitation.  Christian Hartman is living by himself. Says he has taken my advice and stopped marijuana. " i had a stop drug party" 3 weeks ago. Says last use of alcohol and marijuna 3 weeks ago. My girlfriend is strict and on top of it. I am getting annoyed over her. Says now feels bored, others party , he cannot. Does not have a job so more time to himself brings his craving back  Aggravating factors; stress, boredom concentration. Modifying factors; girl friends and some new friends Depression; relevant to day and context. Not ongoing. He misses to party. Context: boredom Otherwise no seizures or significant irritability which initially brought him here.  He has also had seizures including many seizures according to the notes he has difficulty in smelling other than that does not have grand mal seizures. Nothing recently.   He is poor insight regarding the effect of marijuana.   Does not endorse hopelessness to the point of suicidal or homicidal thoughts. Is no prior history of suicidal attempt. No otherwise the physical or sexual trauma except for the skateboard accident in the past   Medical History; Past Medical History  Diagnosis Date  . Asthma     Allergies: No Known Allergies  Medications: Outpatient Encounter Prescriptions as of 10/20/2014  Medication Sig  . albuterol  (PROVENTIL) (5 MG/ML) 0.5% nebulizer solution Take 2.5 mg by nebulization every 6 (six) hours as needed for wheezing or shortness of breath.  [name]. buPROPion (WELLBUTRIN) 100 MG tablet Take 1 tablet (100 mg total) by mouth daily.  . citalopram (CELEXA) 20 MG tablet Take 1 tablet (20 mg total) by mouth daily.  . [DISCONTINUED] buPROPion (WELLBUTRIN) 100 MG tablet Take 1 tablet (100 mg total) by mouth daily.  . [DISCONTINUED] citalopram (CELEXA) 20 MG tablet Take 1 tablet (20 mg total) by mouth daily.   No facility-administered encounter medications on file as of 10/20/2014.     Family History; Family History  Problem Relation Age of Onset  . Congestive Heart Failure Maternal Grandfather   . Diabetes Maternal Grandfather   . Hypertension Maternal Grandfather   . Alcohol abuse Maternal Grandfather   . Diabetes Maternal Aunt   . Alcohol abuse Maternal Aunt   . Hypertension Maternal Grandmother       Labs:  No results found for this or any previous visit (from the past 2160 hour(s)).     Musculoskeletal: Strength & Muscle Tone: within normal limits Gait & Station: normal Patient leans: N/A  Mental Status Examination;   Psychiatric Specialty Exam: Physical Exam  Constitutional: He appears well-developed and well-nourished. No distress.  Skin: He is not diaphoretic.    Review of Systems  Constitutional: Negative.   Skin: Negative for rash.  Neurological: Negative for headaches.  Psychiatric/Behavioral: Negative for suicidal ideas and hallucinations.    There were no vitals taken for this visit.There is  no weight on file to calculate BMI.  General Appearance: Casual  Eye Contact::  Fair  Speech:  Clear and Coherent  Volume:  Normal  Mood:  Somewhat down relavant to circumstances  Affect:  Congruent  Thought Process:  Coherent, Linear and Logical  Orientation:  Full (Time, Place, and Person)  Thought Content:  Rumination  Suicidal Thoughts:  No  Homicidal Thoughts:  No   Memory:  Immediate;   Fair Recent;   Fair  Judgement:  Fair  Insight:  Shallow  Psychomotor Activity:  Normal  Concentration:  Fair  Recall:  Fair  Akathisia:  Negative  Handed:  Right  AIMS (if indicated):     Assets:  Communication Skills Desire for Improvement Financial Resources/Insurance Leisure Time Social Support  Sleep:        Assessment: 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 Asthma     Axis IV: recent head trauma.   Treatment Plan and Summary: Mood disorder and depression: continue wellbutrin and celexa. Has meds no increase needed as he feels more bored rather depressed. Post concussion syndrome; memory is fair and no significant irritability.  Continue therapy to deal with conflicts and substance abstainance.   Pertinent Labs and Relevant Prior Notes reviewed. Medication Side effects, benefits and risks reviewed/discussed with Patient. Time given for patient to respond and asks questions regarding the Diagnosis and Medications. Safety concerns and to report to ER if suicidal or call 911. Relevant Medications refilled or called in to pharmacy. Discussed weight maintenance and Sleep Hygiene. Follow up with Primary care provider in regards to Medical conditions. Recommend compliance with medications and follow up office appointments. Discussed to avail opportunity to consider or/and continue Individual therapy with Counselor. Greater than 50% of time was spend in counseling and coordination of care with the patient.  Schedule for Follow up visit in 6  weeks or call in earlier as necessary.   Thresa RossAKHTAR, Christian Lotter, MD 10/20/2014

## 2014-10-20 NOTE — Psych (Signed)
   THERAPIST PROGRESS NOTE  Session Time: 3:20-3:50pm  Participation Level: Active  Behavioral Response: Well GroomedAlertEuthymic  Type of Therapy: Individual Therapy  Treatment Goals addressed: increase socialization    Interventions: Supportive, solution focused  Suicidal/Homicidal: Denies both  Therapist Interventions: Gathered information about significant events and changes in mood and functioning since last seen in March.  Commended him for making a commitment to stopping his use of substances.  Acknowledged his boredom as a concern.  Encouraged him to get involved in creative endeavors such as participating in a weekly open mic night.  Determined that patient has achieved his goal to decrease isolation.  Discussed the option of terminating therapy at this time.           Summary:   Reported moving into an apartment with his girlfriend and a roommate.  Indicated that he is pleased with his living situation.  Has continued to fill out job applications.   Decided to stop use of marijuana and alcohol with encouragement from his girlfriend.  Admits to missing it.   Reports being bored much of the time.  Spends a lot of time watching movies and playing video games.  Agreed that he needs to get involved in something where he can exercise creativity.   Denies having anger outbursts like he was reporting when starting services. Did not identify any further concerns to address in therapy.  Agreed to terminate therapy.                Plan: No further sessions scheduled.  Diagnosis:   Post Concussion Syndrome with depressed mood        Darrin LuisSolomon, Donatello Kleve A, LCSW 10/20/2014

## 2014-11-20 ENCOUNTER — Ambulatory Visit (HOSPITAL_COMMUNITY): Payer: Self-pay | Admitting: Licensed Clinical Social Worker

## 2014-12-21 ENCOUNTER — Ambulatory Visit (HOSPITAL_COMMUNITY): Payer: Self-pay | Admitting: Licensed Clinical Social Worker

## 2014-12-22 ENCOUNTER — Ambulatory Visit (HOSPITAL_COMMUNITY): Payer: Self-pay | Admitting: Psychiatry

## 2015-01-01 ENCOUNTER — Ambulatory Visit (HOSPITAL_COMMUNITY): Payer: Self-pay | Admitting: Psychiatry

## 2015-01-15 ENCOUNTER — Ambulatory Visit (HOSPITAL_COMMUNITY): Payer: Self-pay | Admitting: Psychiatry

## 2015-01-22 ENCOUNTER — Encounter (HOSPITAL_COMMUNITY): Payer: Self-pay | Admitting: Psychiatry

## 2015-10-11 ENCOUNTER — Ambulatory Visit (HOSPITAL_COMMUNITY): Admission: RE | Admit: 2015-10-11 | Payer: BLUE CROSS/BLUE SHIELD | Source: Home / Self Care | Admitting: Psychiatry

## 2015-10-11 ENCOUNTER — Ambulatory Visit (HOSPITAL_COMMUNITY)
Admission: RE | Admit: 2015-10-11 | Discharge: 2015-10-11 | Disposition: A | Discharge status: Home / Self Care | Payer: BLUE CROSS/BLUE SHIELD | Attending: Psychiatry | Admitting: Psychiatry

## 2015-10-11 NOTE — [facility] Counselor (Signed)
Pt presented as a walk in but left waiting area by front desk without writer's knowledge prior to being assessed. Per pt's sign in sheet, he had written he was "logically suicidal". Pt didn't answer phone call and writer didn't leave voicemail. Writer then [facility]. Writer requested welfare check for pt at his residence. Marylene Landngela reports she will have an officer check on pt's address shortly.  Evette Cristalaroline Paige Meagan Ancona, [place] Therapeutic Triage Specialist

## 2016-08-16 ENCOUNTER — Encounter (HOSPITAL_COMMUNITY): Payer: Self-pay | Admitting: Emergency Medicine

## 2016-08-16 ENCOUNTER — Emergency Department (HOSPITAL_COMMUNITY)
Admission: EM | Admit: 2016-08-16 | Discharge: 2016-08-16 | Disposition: A | Discharge status: Home / Self Care | Payer: PRIVATE HEALTH INSURANCE | Attending: Emergency Medicine | Admitting: Emergency Medicine

## 2016-08-16 DIAGNOSIS — Y939 Activity, unspecified: Secondary | ICD-10-CM | POA: Insufficient documentation

## 2016-08-16 DIAGNOSIS — Y929 Unspecified place or not applicable: Secondary | ICD-10-CM | POA: Insufficient documentation

## 2016-08-16 DIAGNOSIS — W540XXA Bitten by dog, initial encounter: Secondary | ICD-10-CM | POA: Insufficient documentation

## 2016-08-16 DIAGNOSIS — J45909 Unspecified asthma, uncomplicated: Secondary | ICD-10-CM | POA: Insufficient documentation

## 2016-08-16 DIAGNOSIS — F17210 Nicotine dependence, cigarettes, uncomplicated: Secondary | ICD-10-CM | POA: Insufficient documentation

## 2016-08-16 DIAGNOSIS — Z23 Encounter for immunization: Secondary | ICD-10-CM | POA: Insufficient documentation

## 2016-08-16 DIAGNOSIS — S0185XA Open bite of other part of head, initial encounter: Secondary | ICD-10-CM | POA: Insufficient documentation

## 2016-08-16 DIAGNOSIS — Y999 Unspecified external cause status: Secondary | ICD-10-CM | POA: Insufficient documentation

## 2016-08-16 DIAGNOSIS — Z79899 Other long term (current) drug therapy: Secondary | ICD-10-CM | POA: Insufficient documentation

## 2016-08-16 MED ORDER — LIDOCAINE HCL (PF) 1 % IJ SOLN
5.0000 mL | Freq: Once | INTRAMUSCULAR | Status: AC
  Administered 2016-08-16: 5 mL
  Filled 2016-08-16: qty 30

## 2016-08-16 MED ORDER — TETANUS-DIPHTH-ACELL PERTUSSIS 5-2.5-18.5 LF-MCG/0.5 IM SUSP
0.5000 mL | Freq: Once | INTRAMUSCULAR | Status: AC
  Administered 2016-08-16: 0.5 mL via INTRAMUSCULAR
  Filled 2016-08-16: qty 0.5

## 2016-08-16 MED ORDER — RABIES VACCINE, PCEC IM SUSR
1.0000 mL | Freq: Once | INTRAMUSCULAR | Status: AC
  Administered 2016-08-16: 1 mL via INTRAMUSCULAR
  Filled 2016-08-16: qty 1

## 2016-08-16 MED ORDER — AMOXICILLIN-POT CLAVULANATE 875-125 MG PO TABS: 1.0000 | ORAL_TABLET | Freq: Two times a day (BID) | ORAL | 0 refills | Status: AC

## 2016-08-16 MED ORDER — RABIES IMMUNE GLOBULIN 150 UNIT/ML IM INJ
20.0000 [IU]/kg | INJECTION | Freq: Once | INTRAMUSCULAR | Status: AC
Start: 2016-08-16 — End: 2016-08-16
  Administered 2016-08-16: 1425 [IU]
  Filled 2016-08-16: qty 9.5

## 2016-08-16 NOTE — Discharge Instructions (Signed)
You have been given a tetanus, rabies immunoglobulin, and rabies vaccine here. You need to come back to continue the get rabies vaccine after today's visit on days 3, 7, and 14. It is extremely important that you get this done. Please take Augmentin as prescribed.  Get help right away if: You have a red streak extending away from your wound. You have fluid, blood, or pus coming from your wound. You have a fever or chills. You have trouble moving your injured area. You have numbness or tingling extending beyond the wound.

## 2016-08-16 NOTE — ED Provider Notes (Signed)
[facility]-EMERGENCY DEPT Provider Note   CSN: [id number] Arrival date & time: 08/16/16  2020 By signing my name below, I, Levon Hedger, attest that this documentation has been prepared under the direction and in the presence of non-physician practitioner, Candie Mile, [address] Electronically Signed: Levon Hedger, Scribe. 08/16/2016. 9:42 PM.   History   Chief Complaint Chief Complaint  Patient presents with  . Animal Bite   HPI Christian Hartman is a 26 y.o. male who presents to the Emergency Department complaining of a laceration to his left face sustained last night. Pt states he tried to pet a dog that he was unfamiliar with last night and it bit his face. Pt has had applied antibiotic ointment and rubbing alcohol to the laceration PTA with relief of pain. Per pt, the pain has resolved. Bleeding is controlled. He reports associated scratches to his bilateral arms.Dog's vaccination status unknown; pt is UTD on his vaccinations. He denies any fever, chills, generalized body aches, diarrhea, constipation, or any other associated symptoms. He has no other complaints at this time.   The history is provided by the patient. No language interpreter was used.   Past Medical History:  Diagnosis Date  . Asthma     Patient Active Problem List   Diagnosis Date Noted  . Post concussion syndrome 03/24/2014  . 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 Cannabis use disorder, moderate, dependence (HCC) 03/24/2014  . Behavioral problems 01/09/2014  . Impulsiveness 01/09/2014  . Head trauma 11/11/2012  . Asthma, chronic 10/24/2012  . Skull fracture (HCC) 10/22/2012  . Subdural hemorrhage, traumatic (HCC) 10/22/2012  . Fall from skateboard 10/21/2012  . Concussion 10/21/2012  . Acute respiratory failure (HCC) 10/21/2012  . 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 No pertinent surgical history.   Home Medications    Prior to Admission  medications   Medication Sig Start Date End Date Taking? Authorizing Provider  albuterol (PROVENTIL) (5 MG/ML) 0.5% nebulizer solution Take 2.5 mg by nebulization every 6 (six) hours as needed for wheezing or shortness of breath.    Historical Provider, MD  amoxicillin-clavulanate (AUGMENTIN) 875-125 MG tablet Take 1 tablet by mouth 2 (two) times daily. 08/16/16 08/23/16  Kailen Name Manuel Fiore Detjen, [place]  buPROPion (WELLBUTRIN) 100 MG tablet Take 1 tablet (100 mg total) by mouth daily. 10/20/14   Thresa Ross, MD  citalopram (CELEXA) 20 MG tablet Take 1 tablet (20 mg total) by mouth daily. 10/20/14   Thresa Ross, MD    Family History Family History  Problem Relation Age of Onset  . Congestive Heart Failure Maternal Grandfather   . Diabetes Maternal Grandfather   . Hypertension Maternal Grandfather   . Alcohol abuse Maternal Grandfather   . Diabetes Maternal Aunt   . Alcohol abuse Maternal Aunt   . Hypertension Maternal Grandmother     Social History Social History  Substance Use Topics  . Smoking status: Current Every Day Smoker    Packs/day: 0.50    Types: Cigarettes  . Smokeless tobacco: Never Used  . Alcohol use Yes    Allergies   Patient has no known allergies.   Review of Systems Review of Systems  Constitutional: Negative for chills and fever.  Gastrointestinal: Negative for constipation and diarrhea.  Skin: Positive for wound.    Physical Exam Updated Vital Signs BP 124/85   Pulse 98   Temp 98.9 F (37.2 C) (Oral)   Resp 18   Ht 6\' 3"  (1.905 m)  Wt 70.8 kg   SpO2 99%   BMI 19.50 kg/m   Physical Exam  Constitutional: He is oriented to person, place, and time. He appears well-developed and well-nourished. No distress.  HENT:  Head: Normocephalic.  Mouth/Throat: Oropharynx is clear and moist.  No penetration of laceration through mouth noted. No tenderness upon palpation through mouth.   Eyes: Conjunctivae are normal.  Cardiovascular: Normal rate, normal  heart sounds and intact distal pulses.   Pulmonary/Chest: Effort normal and breath sounds normal. No respiratory distress.  Abdominal: Soft. He exhibits no distension.  Musculoskeletal: Normal range of motion.  Neurological: He is alert and oriented to person, place, and time.  Skin: Skin is warm and dry. Capillary refill takes less than 2 seconds.  See picture attached. Patient has a facial laceration on his left cheek. It measures about 2 cm in length. No drainage visualized.No active bleeding. It is nontender to palpation. No surrounding erythema.  Psychiatric: He has a normal mood and affect.  Nursing note and vitals reviewed.      ED Treatments / Results  DIAGNOSTIC STUDIES:  Oxygen Saturation is 100% on RA, normal by my interpretation.    COORDINATION OF CARE:  9:41 PM Discussed treatment plan with pt at bedside and pt agreed to plan.   Labs (all labs ordered are listed, but only abnormal results are displayed) Labs Reviewed - No data to display  EKG  EKG Interpretation None       Radiology No results found.  Procedures Procedures (including critical care time)  Medications Ordered in ED Medications  Tdap (BOOSTRIX) injection 0.5 mL (0.5 mLs Intramuscular Given 08/16/16 2237)  rabies immune globulin (HYPERAB) injection 1,425 Units (1,425 Units Infiltration Given 08/16/16 2305)  lidocaine (PF) (XYLOCAINE) 1 % injection 5 mL (5 mLs Infiltration Given 08/16/16 2242)  rabies vaccine (RABAVERT) injection 1 mL (1 mL Intramuscular Given 08/16/16 2302)     Initial Impression / Assessment and Plan / ED Course  I have reviewed the triage vital signs and the nursing notes.  Pertinent labs & imaging results that were available during my care of the patient were reviewed by me and considered in my medical decision making (see chart for details).    Christian Hartman presents with laceration from a dog bite on his face.  Pt wounds irrigated well with 18ga angiocath with sterile  saline.  Wounds examined with visualization of the base and no foreign bodies seen.  Pt Alert and oriented, NAD, nontoxic, nonseptic appearing.  Capillary refill intact and pt without neurologic deficit.  Patient tetanus given here.  Patient rabies vaccine and immunoglobulin given. Pain treated in the emergency department. Wounds not closed secondary to concern for infection. We'll discharge home with, Augmentin and requests for close follow-up with PCP or back in the ER. Patient also given instructions to return on day 3, 7, 14 for his follow-up rabies vaccines. He reports not having a PCP. Patient given financial resource guide here.  Case discussed with Dr. Clydene Pugh who agreed with assessment and plan.    Final Clinical Impressions(s) / ED Diagnoses   Final diagnoses:  Dog bite of face, initial encounter    New Prescriptions New Prescriptions   AMOXICILLIN-CLAVULANATE (AUGMENTIN) 875-125 MG TABLET    Take 1 tablet by mouth 2 (two) times daily.  I personally performed the services described in this documentation, which was scribed in my presence. The recorded information has been reviewed and is accurate.   [address] [place], [place] 08/16/16 2337  Lyndal Pulleyaniel Knott, MD 08/18/16 [phone]

## 2016-08-16 NOTE — ED Triage Notes (Signed)
Pt was walking on [address] last night and approached a dog he did not know and tried to pet it.  States dog bit the left side of his face.  Small laceration noted to face.  Pt did not make animal control aware.

## 2019-12-10 ENCOUNTER — Other Ambulatory Visit: Payer: Self-pay

## 2019-12-10 ENCOUNTER — Ambulatory Visit (HOSPITAL_COMMUNITY)
Admission: EM | Admit: 2019-12-10 | Discharge: 2019-12-10 | Disposition: A | Discharge status: Home / Self Care | Payer: No Payment, Other | Attending: Psychiatry | Admitting: Psychiatry

## 2019-12-10 DIAGNOSIS — F339 Major depressive disorder, recurrent, unspecified: Secondary | ICD-10-CM

## 2019-12-10 DIAGNOSIS — F419 Anxiety disorder, unspecified: Secondary | ICD-10-CM | POA: Insufficient documentation

## 2019-12-10 DIAGNOSIS — F331 Major depressive disorder, recurrent, moderate: Secondary | ICD-10-CM | POA: Insufficient documentation

## 2019-12-10 DIAGNOSIS — F329 Major depressive disorder, single episode, unspecified: Secondary | ICD-10-CM | POA: Insufficient documentation

## 2019-12-10 DIAGNOSIS — F6381 Intermittent explosive disorder: Secondary | ICD-10-CM

## 2019-12-10 DIAGNOSIS — F411 Generalized anxiety disorder: Secondary | ICD-10-CM

## 2019-12-10 DIAGNOSIS — F1210 Cannabis abuse, uncomplicated: Secondary | ICD-10-CM

## 2019-12-10 MED ORDER — GABAPENTIN 100 MG PO CAPS: 100.0000 mg | ORAL_CAPSULE | Freq: Three times a day (TID) | ORAL | 0 refills | Status: DC

## 2019-12-10 MED ORDER — QUETIAPINE FUMARATE 50 MG PO TABS: 50.0000 mg | ORAL_TABLET | Freq: Every day | ORAL | 0 refills | Status: DC

## 2019-12-10 NOTE — Discharge Instructions (Signed)
Start medications as prescribed Call to schedule appointment for psychiatry Return if symptoms worsen

## 2019-12-10 NOTE — ED Notes (Signed)
Patient items in [place]
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
## 2019-12-10 NOTE — ED Notes (Signed)
Patient A&O x 4, ambulatory. Patient discharged in no acute distress. Patient denied SI/HI, A/VH upon discharge. Patient verbalized understanding of all discharge instructions explained by staff, to include follow up appointments, RX's and safety plan. Pt belongings returned to patient from locker # 27 intact. Patient escorted to lobby via staff for self transport to destination. Safety maintained.

## 2020-01-05 ENCOUNTER — Encounter (HOSPITAL_COMMUNITY): Payer: Self-pay | Admitting: Psychiatry

## 2020-02-17 ENCOUNTER — Other Ambulatory Visit: Payer: Self-pay

## 2020-02-17 ENCOUNTER — Ambulatory Visit (INDEPENDENT_AMBULATORY_CARE_PROVIDER_SITE_OTHER): Payer: No Payment, Other | Admitting: Behavioral Health

## 2020-02-17 DIAGNOSIS — F6381 Intermittent explosive disorder: Secondary | ICD-10-CM | POA: Diagnosis not present

## 2020-02-19 NOTE — Progress Notes (Signed)
   THERAPIST PROGRESS NOTE  Session Time: 1:00pm  Participation Level: Active  Behavioral Response: Fairly Groomed and NeatAlertAppropriate  Type of Therapy: Individual Therapy  Treatment Goals addressed: Anger  Interventions: CBT  Summary: Christian Hartman is a 29 y.o. male who presents to [facility] outpatient services after undergoing observation at the [facility]. [name] reports he was seen at the [facility] on 12/10/2019 for anxiety, depression, anger outburst, and thoughts of self-harm. [name] presents today reporting "I have a very long history of depression". He described that when he becomes depressed or anxious, he becomes aggressive and destructive towards property. [name] states "I do not have any impulse control". He talked about his girlfriend being his primary source of financial and emotional support. He reported having passive thoughts of suicide throughout his life. He further reported "I don't fear death ... I fear the experience of dying. I fear if I jump off a building, I will not die instantly". [name] states he has not been consistently taking the medication he was prescribed upon discharge from the [facility] (Gabapentin and Seroquel). He reports having poor sleep patterns and frequent feelings of worthlessness. He would benefit from continuing to work towards developing coping skills to better manage his anger and explosive episodes.   Suicidal/Homicidal: No  Therapist Response: Therapist utilized CBT to assist [name] with recognizing his thoughts and how distorted thinking patterns can trigger his outbursts of anger. Therapist also encouraged [name] to try and process the underlying primary emotions to his anger (i.e. embarrassment, hurt, shame, guilt, etc.), as anger is a secondary emotion.  Plan: Return again in 4 weeks.  Diagnosis: Axis I: Intermittent Explosive Disorder    Axis II: Rule Out Borderline Personality Dis.    Mamie Nick, Counselor 02/19/2020

## 2020-03-16 ENCOUNTER — Ambulatory Visit (HOSPITAL_COMMUNITY): Payer: No Payment, Other | Admitting: Behavioral Health

## 2020-03-16 ENCOUNTER — Other Ambulatory Visit: Payer: Self-pay
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
## 2020-04-07 ENCOUNTER — Ambulatory Visit (HOSPITAL_COMMUNITY): Payer: No Payment, Other | Admitting: Psychiatry

## 2020-04-19 ENCOUNTER — Ambulatory Visit (HOSPITAL_COMMUNITY): Payer: No Payment, Other | Admitting: Behavioral Health

## 2020-12-15 ENCOUNTER — Ambulatory Visit (HOSPITAL_COMMUNITY): Payer: No Payment, Other | Admitting: Licensed Clinical Social Worker

## 2020-12-16 ENCOUNTER — Inpatient Hospital Stay (HOSPITAL_COMMUNITY): Payer: Self-pay

## 2020-12-16 ENCOUNTER — Inpatient Hospital Stay (HOSPITAL_COMMUNITY)
Admission: EM | Admit: 2020-12-16 | Discharge: 2020-12-30 | DRG: 219 | Discharge status: Against Medical Advice | Payer: Self-pay | Attending: General Surgery | Admitting: General Surgery

## 2020-12-16 ENCOUNTER — Emergency Department (HOSPITAL_COMMUNITY): Payer: Self-pay

## 2020-12-16 ENCOUNTER — Emergency Department (HOSPITAL_COMMUNITY): Payer: Self-pay | Admitting: Anesthesiology

## 2020-12-16 ENCOUNTER — Encounter (HOSPITAL_COMMUNITY): Admission: EM | Discharge status: Against Medical Advice | Payer: Self-pay | Source: Home / Self Care

## 2020-12-16 ENCOUNTER — Encounter (HOSPITAL_COMMUNITY): Payer: Self-pay | Admitting: Certified Registered"

## 2020-12-16 ENCOUNTER — Other Ambulatory Visit: Payer: Self-pay

## 2020-12-16 DIAGNOSIS — S88921A Partial traumatic amputation of right lower leg, level unspecified, initial encounter: Secondary | ICD-10-CM

## 2020-12-16 DIAGNOSIS — Z20822 Contact with and (suspected) exposure to covid-19: Secondary | ICD-10-CM | POA: Diagnosis present

## 2020-12-16 DIAGNOSIS — I71019 Dissection of thoracic aorta, unspecified: Secondary | ICD-10-CM

## 2020-12-16 DIAGNOSIS — Z23 Encounter for immunization: Secondary | ICD-10-CM | POA: Diagnosis not present

## 2020-12-16 DIAGNOSIS — S36116A Major laceration of liver, initial encounter: Secondary | ICD-10-CM | POA: Diagnosis present

## 2020-12-16 DIAGNOSIS — J90 Pleural effusion, not elsewhere classified: Secondary | ICD-10-CM | POA: Diagnosis present

## 2020-12-16 DIAGNOSIS — J939 Pneumothorax, unspecified: Secondary | ICD-10-CM | POA: Diagnosis present

## 2020-12-16 DIAGNOSIS — Z79899 Other long term (current) drug therapy: Secondary | ICD-10-CM | POA: Diagnosis not present

## 2020-12-16 DIAGNOSIS — R451 Restlessness and agitation: Secondary | ICD-10-CM | POA: Diagnosis present

## 2020-12-16 DIAGNOSIS — S82839A Other fracture of upper and lower end of unspecified fibula, initial encounter for closed fracture: Secondary | ICD-10-CM | POA: Diagnosis present

## 2020-12-16 DIAGNOSIS — Z9289 Personal history of other medical treatment: Secondary | ICD-10-CM

## 2020-12-16 DIAGNOSIS — Z4659 Encounter for fitting and adjustment of other gastrointestinal appliance and device: Secondary | ICD-10-CM

## 2020-12-16 DIAGNOSIS — Z87891 Personal history of nicotine dependence: Secondary | ICD-10-CM | POA: Diagnosis not present

## 2020-12-16 DIAGNOSIS — Z5329 Procedure and treatment not carried out because of patient's decision for other reasons: Secondary | ICD-10-CM | POA: Diagnosis not present

## 2020-12-16 DIAGNOSIS — Z7982 Long term (current) use of aspirin: Secondary | ICD-10-CM

## 2020-12-16 DIAGNOSIS — J9601 Acute respiratory failure with hypoxia: Secondary | ICD-10-CM | POA: Diagnosis present

## 2020-12-16 DIAGNOSIS — Z87820 Personal history of traumatic brain injury: Secondary | ICD-10-CM

## 2020-12-16 DIAGNOSIS — R61 Generalized hyperhidrosis: Secondary | ICD-10-CM | POA: Diagnosis present

## 2020-12-16 DIAGNOSIS — S2502XA Major laceration of thoracic aorta, initial encounter: Secondary | ICD-10-CM

## 2020-12-16 DIAGNOSIS — S82871C Displaced pilon fracture of right tibia, initial encounter for open fracture type IIIA, IIIB, or IIIC: Secondary | ICD-10-CM | POA: Diagnosis present

## 2020-12-16 DIAGNOSIS — J9690 Respiratory failure, unspecified, unspecified whether with hypoxia or hypercapnia: Secondary | ICD-10-CM

## 2020-12-16 DIAGNOSIS — Z781 Physical restraint status: Secondary | ICD-10-CM | POA: Diagnosis not present

## 2020-12-16 DIAGNOSIS — D62 Acute posthemorrhagic anemia: Secondary | ICD-10-CM | POA: Diagnosis present

## 2020-12-16 DIAGNOSIS — T1490XA Injury, unspecified, initial encounter: Secondary | ICD-10-CM | POA: Diagnosis present

## 2020-12-16 DIAGNOSIS — N179 Acute kidney failure, unspecified: Secondary | ICD-10-CM | POA: Diagnosis present

## 2020-12-16 DIAGNOSIS — I7101 Dissection of thoracic aorta: Secondary | ICD-10-CM | POA: Diagnosis present

## 2020-12-16 HISTORY — PX: AMPUTATION: SHX166

## 2020-12-16 HISTORY — PX: THORACIC AORTIC ENDOVASCULAR STENT GRAFT: SHX6112

## 2020-12-16 LAB — URINALYSIS, ROUTINE W REFLEX MICROSCOPIC
Bilirubin Urine: NEGATIVE
Glucose, UA: 50 mg/dL — AB
Ketones, ur: NEGATIVE mg/dL
Leukocytes,Ua: NEGATIVE
Nitrite: NEGATIVE
Protein, ur: 100 mg/dL — AB
RBC / HPF: >50 RBC/hpf — ABNORMAL HIGH (ref 0–5)
Specific Gravity, Urine: 1.028 (ref 1.005–1.030)
pH: 7 (ref 5.0–8.0)

## 2020-12-16 LAB — I-STAT ARTERIAL BLOOD GAS, ED
Acid-base deficit: 7 mmol/L — ABNORMAL HIGH (ref 0.0–2.0)
Bicarbonate: 19.6 mmol/L — ABNORMAL LOW (ref 20.0–28.0)
Calcium, Ion: 1.14 mmol/L — ABNORMAL LOW (ref 1.15–1.40)
HCT: 34 % — ABNORMAL LOW (ref 39.0–52.0)
Hemoglobin: 11.6 g/dL — ABNORMAL LOW (ref 13.0–17.0)
O2 Saturation: 99 %
Patient temperature: 97.6
Potassium: 3.4 mmol/L — ABNORMAL LOW (ref 3.5–5.1)
Sodium: 136 mmol/L (ref 135–145)
TCO2: 21 mmol/L — ABNORMAL LOW (ref 22–32)
pCO2 arterial: 40.9 mmHg (ref 32.0–48.0)
pH, Arterial: 7.285 — ABNORMAL LOW (ref 7.350–7.450)
pO2, Arterial: 180 mmHg — ABNORMAL HIGH (ref 83.0–108.0)

## 2020-12-16 LAB — POCT I-STAT 7, (LYTES, BLD GAS, ICA,H+H)
Acid-base deficit: 8 mmol/L — ABNORMAL HIGH (ref 0.0–2.0)
Acid-base deficit: 4 mmol/L — ABNORMAL HIGH (ref 0.0–2.0)
Bicarbonate: 20.9 mmol/L (ref 20.0–28.0)
Bicarbonate: 20.5 mmol/L (ref 20.0–28.0)
Calcium, Ion: 1.2 mmol/L (ref 1.15–1.40)
Calcium, Ion: 1.03 mmol/L — ABNORMAL LOW (ref 1.15–1.40)
HCT: 30 % — ABNORMAL LOW (ref 39.0–52.0)
HCT: 23 % — ABNORMAL LOW (ref 39.0–52.0)
Hemoglobin: 10.2 g/dL — ABNORMAL LOW (ref 13.0–17.0)
Hemoglobin: 7.8 g/dL — ABNORMAL LOW (ref 13.0–17.0)
O2 Saturation: 95 %
O2 Saturation: 98 %
Patient temperature: 34.1
Patient temperature: 96.7
Potassium: 4.1 mmol/L (ref 3.5–5.1)
Potassium: 3.6 mmol/L (ref 3.5–5.1)
Sodium: 138 mmol/L (ref 135–145)
Sodium: 142 mmol/L (ref 135–145)
TCO2: 23 mmol/L (ref 22–32)
TCO2: 22 mmol/L (ref 22–32)
pCO2 arterial: 55.1 mmHg — ABNORMAL HIGH (ref 32.0–48.0)
pCO2 arterial: 32.5 mmHg (ref 32.0–48.0)
pH, Arterial: 7.169 — CL (ref 7.350–7.450)
pH, Arterial: 7.403 (ref 7.350–7.450)
pO2, Arterial: 82 mmHg — ABNORMAL LOW (ref 83.0–108.0)
pO2, Arterial: 106 mmHg (ref 83.0–108.0)

## 2020-12-16 LAB — COMPREHENSIVE METABOLIC PANEL
ALT: 331 U/L — ABNORMAL HIGH (ref 0–44)
AST: 594 U/L — ABNORMAL HIGH (ref 15–41)
Albumin: 4.1 g/dL (ref 3.5–5.0)
Alkaline Phosphatase: 62 U/L (ref 38–126)
Anion gap: 22 — ABNORMAL HIGH (ref 5–15)
BUN: 14 mg/dL (ref 6–20)
CO2: 13 mmol/L — ABNORMAL LOW (ref 22–32)
Calcium: 9.1 mg/dL (ref 8.9–10.3)
Chloride: 103 mmol/L (ref 98–111)
Creatinine, Ser: 1.56 mg/dL — ABNORMAL HIGH (ref 0.61–1.24)
GFR, Estimated: >60 mL/min (ref >60)
Glucose, Bld: 185 mg/dL — ABNORMAL HIGH (ref 70–99)
Potassium: 3.8 mmol/L (ref 3.5–5.1)
Sodium: 138 mmol/L (ref 135–145)
Total Bilirubin: 1.2 mg/dL (ref 0.3–1.2)
Total Protein: 7.4 g/dL (ref 6.5–8.1)

## 2020-12-16 LAB — SAMPLE TO BLOOD BANK

## 2020-12-16 LAB — PROTIME-INR
INR: 1.2 (ref 0.8–1.2)
Prothrombin Time: 14.9 seconds (ref 11.4–15.2)

## 2020-12-16 LAB — CBC
HCT: 44.8 % (ref 39.0–52.0)
HCT: 25.9 % — ABNORMAL LOW (ref 39.0–52.0)
Hemoglobin: 14.4 g/dL (ref 13.0–17.0)
Hemoglobin: 8.9 g/dL — ABNORMAL LOW (ref 13.0–17.0)
MCH: 33.3 pg (ref 26.0–34.0)
MCH: 33.6 pg (ref 26.0–34.0)
MCHC: 32.1 g/dL (ref 30.0–36.0)
MCHC: 34.4 g/dL (ref 30.0–36.0)
MCV: 103.5 fL — ABNORMAL HIGH (ref 80.0–100.0)
MCV: 97.7 fL (ref 80.0–100.0)
Platelets: 234 10*3/uL (ref 150–400)
Platelets: 159 10*3/uL (ref 150–400)
RBC: 4.33 MIL/uL (ref 4.22–5.81)
RBC: 2.65 MIL/uL — ABNORMAL LOW (ref 4.22–5.81)
RDW: 12.3 % (ref 11.5–15.5)
RDW: 12.4 % (ref 11.5–15.5)
WBC: 9.5 10*3/uL (ref 4.0–10.5)
WBC: 21.6 10*3/uL — ABNORMAL HIGH (ref 4.0–10.5)
nRBC: 0 % (ref 0.0–0.2)
nRBC: 0.1 % (ref 0.0–0.2)

## 2020-12-16 LAB — RESP PANEL BY RT-PCR (FLU A&B, COVID) ARPGX2
Influenza A by PCR: NEGATIVE
Influenza B by PCR: NEGATIVE
SARS Coronavirus 2 by RT PCR: NEGATIVE

## 2020-12-16 LAB — I-STAT CHEM 8, ED
BUN: 15 mg/dL (ref 6–20)
Calcium, Ion: 1.07 mmol/L — ABNORMAL LOW (ref 1.15–1.40)
Chloride: 107 mmol/L (ref 98–111)
Creatinine, Ser: 1.5 mg/dL — ABNORMAL HIGH (ref 0.61–1.24)
Glucose, Bld: 176 mg/dL — ABNORMAL HIGH (ref 70–99)
HCT: 47 % (ref 39.0–52.0)
Hemoglobin: 16 g/dL (ref 13.0–17.0)
Potassium: 3.7 mmol/L (ref 3.5–5.1)
Sodium: 138 mmol/L (ref 135–145)
TCO2: 14 mmol/L — ABNORMAL LOW (ref 22–32)

## 2020-12-16 LAB — ETHANOL: Alcohol, Ethyl (B): 11 mg/dL — ABNORMAL HIGH (ref <10)

## 2020-12-16 LAB — PREPARE RBC (CROSSMATCH)

## 2020-12-16 LAB — LACTIC ACID, PLASMA: Lactic Acid, Venous: >11 mmol/L (ref 0.5–1.9)

## 2020-12-16 LAB — ABO/RH: ABO/RH(D): O NEG

## 2020-12-16 SURGERY — AMPUTATION BELOW KNEE
Anesthesia: General | Site: Knee | Laterality: Right

## 2020-12-16 MED ORDER — ORAL CARE MOUTH RINSE
15.0000 mL | OROMUCOSAL | Status: DC
  Administered 2020-12-16 – 2020-12-18 (×15): 15 mL via OROMUCOSAL

## 2020-12-16 MED ORDER — MORPHINE SULFATE (PF) 2 MG/ML IV SOLN: 2.0000 mg | INTRAVENOUS | Status: DC | PRN

## 2020-12-16 MED ORDER — CHLORHEXIDINE GLUCONATE 4 % EX LIQD
60.0000 mL | Freq: Once | CUTANEOUS | Status: DC
  Filled 2020-12-16: qty 60

## 2020-12-16 MED ORDER — LACTATED RINGERS IV SOLN: INTRAVENOUS | Status: DC | PRN

## 2020-12-16 MED ORDER — MAGNESIUM SULFATE 2 GM/50ML IV SOLN
2.0000 g | Freq: Every day | INTRAVENOUS | Status: DC | PRN
  Filled 2020-12-16: qty 50

## 2020-12-16 MED ORDER — GUAIFENESIN-DM 100-10 MG/5ML PO SYRP: 15.0000 mL | ORAL_SOLUTION | ORAL | Status: DC | PRN

## 2020-12-16 MED ORDER — PHENYLEPHRINE HCL (PRESSORS) 10 MG/ML IV SOLN
INTRAVENOUS | Status: DC | PRN
  Administered 2020-12-16: 80 ug via INTRAVENOUS

## 2020-12-16 MED ORDER — KETAMINE HCL 10 MG/ML IJ SOLN
INTRAMUSCULAR | Status: DC | PRN
  Administered 2020-12-16: 30 mg via INTRAVENOUS
  Administered 2020-12-16: 20 mg via INTRAVENOUS

## 2020-12-16 MED ORDER — SODIUM CHLORIDE 0.9 % IR SOLN
Status: DC | PRN
  Administered 2020-12-16: 1000 mL

## 2020-12-16 MED ORDER — CHLORHEXIDINE GLUCONATE CLOTH 2 % EX PADS
6.0000 | MEDICATED_PAD | Freq: Every day | CUTANEOUS | Status: DC
  Administered 2020-12-16 – 2020-12-27 (×13): 6 via TOPICAL

## 2020-12-16 MED ORDER — IOHEXOL 350 MG/ML SOLN
100.0000 mL | Freq: Once | INTRAVENOUS | Status: AC | PRN
  Administered 2020-12-16: 100 mL via INTRAVENOUS

## 2020-12-16 MED ORDER — BISACODYL 10 MG RE SUPP
10.0000 mg | Freq: Every day | RECTAL | Status: DC | PRN
  Administered 2020-12-28: 10 mg via RECTAL
  Filled 2020-12-16: qty 1

## 2020-12-16 MED ORDER — ETOMIDATE 2 MG/ML IV SOLN
INTRAVENOUS | Status: AC | PRN
  Administered 2020-12-16: 20 mg via INTRAVENOUS

## 2020-12-16 MED ORDER — VECURONIUM BROMIDE 10 MG IV SOLR
INTRAVENOUS | Status: AC | PRN
  Administered 2020-12-16: 10 mg via INTRAVENOUS

## 2020-12-16 MED ORDER — ACETAMINOPHEN 650 MG RE SUPP: 325.0000 mg | RECTAL | Status: DC | PRN

## 2020-12-16 MED ORDER — IODIXANOL 320 MG/ML IV SOLN
INTRAVENOUS | Status: DC | PRN
  Administered 2020-12-16: 60 mL

## 2020-12-16 MED ORDER — FENTANYL BOLUS VIA INFUSION
50.0000 ug | INTRAVENOUS | Status: DC | PRN
  Administered 2020-12-22 (×3): 100 ug via INTRAVENOUS
  Administered 2020-12-24 – 2020-12-25 (×4): 50 ug via INTRAVENOUS
  Administered 2020-12-25: 100 ug via INTRAVENOUS
  Administered 2020-12-25: 50 ug via INTRAVENOUS
  Filled 2020-12-16: qty 100

## 2020-12-16 MED ORDER — LABETALOL HCL 5 MG/ML IV SOLN
10.0000 mg | INTRAVENOUS | Status: AC | PRN
  Administered 2020-12-20 (×4): 10 mg via INTRAVENOUS
  Filled 2020-12-16 (×4): qty 4

## 2020-12-16 MED ORDER — FENTANYL CITRATE (PF) 100 MCG/2ML IJ SOLN
INTRAMUSCULAR | Status: DC | PRN
  Administered 2020-12-16: 150 ug via INTRAVENOUS
  Administered 2020-12-16: 100 ug via INTRAVENOUS

## 2020-12-16 MED ORDER — ACETAMINOPHEN 325 MG PO TABS
325.0000 mg | ORAL_TABLET | ORAL | Status: DC | PRN
  Administered 2020-12-18 – 2020-12-23 (×4): 650 mg
  Filled 2020-12-16 (×4): qty 2

## 2020-12-16 MED ORDER — MAGNESIUM HYDROXIDE 400 MG/5ML PO SUSP: 30.0000 mL | Freq: Every day | ORAL | Status: DC | PRN

## 2020-12-16 MED ORDER — PHENYLEPHRINE HCL-NACL 10-0.9 MG/250ML-% IV SOLN
INTRAVENOUS | Status: DC | PRN
  Administered 2020-12-16: 50 ug/min via INTRAVENOUS

## 2020-12-16 MED ORDER — FENTANYL CITRATE (PF) 100 MCG/2ML IJ SOLN
INTRAMUSCULAR | Status: AC
  Administered 2020-12-16: 50 ug via INTRAVENOUS
  Filled 2020-12-16: qty 2

## 2020-12-16 MED ORDER — HEPARIN 6000 UNIT IRRIGATION SOLUTION
Status: DC | PRN
  Administered 2020-12-16: 1

## 2020-12-16 MED ORDER — PROPOFOL 1000 MG/100ML IV EMUL
5.0000 ug/kg/min | INTRAVENOUS | Status: DC
  Administered 2020-12-16: 60 ug/kg/min via INTRAVENOUS
  Administered 2020-12-16: 15 ug/kg/min via INTRAVENOUS
  Administered 2020-12-17: 70 ug/kg/min via INTRAVENOUS
  Administered 2020-12-17: 60 ug/kg/min via INTRAVENOUS
  Administered 2020-12-17 (×4): 80 ug/kg/min via INTRAVENOUS
  Administered 2020-12-17 (×2): 60 ug/kg/min via INTRAVENOUS
  Administered 2020-12-18: 80 ug/kg/min via INTRAVENOUS
  Administered 2020-12-18: 50 ug/kg/min via INTRAVENOUS
  Administered 2020-12-18 (×3): 80 ug/kg/min via INTRAVENOUS
  Administered 2020-12-19 (×2): 40 ug/kg/min via INTRAVENOUS
  Administered 2020-12-19: 70 ug/kg/min via INTRAVENOUS
  Administered 2020-12-19: 60 ug/kg/min via INTRAVENOUS
  Administered 2020-12-19: 70 ug/kg/min via INTRAVENOUS
  Administered 2020-12-20 (×2): 60 ug/kg/min via INTRAVENOUS
  Administered 2020-12-20 (×2): 80 ug/kg/min via INTRAVENOUS
  Administered 2020-12-20: 60 ug/kg/min via INTRAVENOUS
  Administered 2020-12-20 – 2020-12-21 (×10): 80 ug/kg/min via INTRAVENOUS
  Administered 2020-12-21: 75 ug/kg/min via INTRAVENOUS
  Administered 2020-12-21: 70 ug/kg/min via INTRAVENOUS
  Administered 2020-12-22: 60 ug/kg/min via INTRAVENOUS
  Administered 2020-12-22: 70 ug/kg/min via INTRAVENOUS
  Administered 2020-12-22 (×3): 50 ug/kg/min via INTRAVENOUS
  Administered 2020-12-22 (×2): 70 ug/kg/min via INTRAVENOUS
  Administered 2020-12-23 (×2): 50 ug/kg/min via INTRAVENOUS
  Administered 2020-12-23 – 2020-12-24 (×7): 70 ug/kg/min via INTRAVENOUS
  Administered 2020-12-24: 60 ug/kg/min via INTRAVENOUS
  Administered 2020-12-24: 70 ug/kg/min via INTRAVENOUS
  Administered 2020-12-24: 50 ug/kg/min via INTRAVENOUS
  Administered 2020-12-24: 65 ug/kg/min via INTRAVENOUS
  Administered 2020-12-24 (×2): 70 ug/kg/min via INTRAVENOUS
  Administered 2020-12-24: 65 ug/kg/min via INTRAVENOUS
  Administered 2020-12-25: 55 ug/kg/min via INTRAVENOUS
  Administered 2020-12-25: 70 ug/kg/min via INTRAVENOUS
  Administered 2020-12-25: 60 ug/kg/min via INTRAVENOUS
  Administered 2020-12-25: 70 ug/kg/min via INTRAVENOUS
  Administered 2020-12-25: 60 ug/kg/min via INTRAVENOUS
  Administered 2020-12-25 (×2): 70 ug/kg/min via INTRAVENOUS
  Administered 2020-12-25: 80 ug/kg/min via INTRAVENOUS
  Administered 2020-12-25 – 2020-12-26 (×5): 70 ug/kg/min via INTRAVENOUS
  Administered 2020-12-26: 55 ug/kg/min via INTRAVENOUS
  Administered 2020-12-26 (×2): 80 ug/kg/min via INTRAVENOUS
  Administered 2020-12-26: 60 ug/kg/min via INTRAVENOUS
  Administered 2020-12-26 – 2020-12-27 (×3): 70 ug/kg/min via INTRAVENOUS
  Administered 2020-12-27: 80 ug/kg/min via INTRAVENOUS
  Filled 2020-12-16 (×16): qty 100
  Filled 2020-12-16: qty 200
  Filled 2020-12-16 (×62): qty 100
  Filled 2020-12-16: qty 300
  Filled 2020-12-16 (×2): qty 100
  Filled 2020-12-16: qty 200

## 2020-12-16 MED ORDER — POVIDONE-IODINE 10 % EX SWAB: 2.0000 "application " | Freq: Once | CUTANEOUS | Status: DC

## 2020-12-16 MED ORDER — POTASSIUM CHLORIDE CRYS ER 20 MEQ PO TBCR: 20.0000 meq | EXTENDED_RELEASE_TABLET | Freq: Every day | ORAL | Status: DC | PRN

## 2020-12-16 MED ORDER — PANTOPRAZOLE SODIUM 40 MG PO PACK
40.0000 mg | PACK | Freq: Every day | ORAL | Status: DC
  Administered 2020-12-17 – 2020-12-26 (×9): 40 mg
  Filled 2020-12-16 (×9): qty 20

## 2020-12-16 MED ORDER — HYDRALAZINE HCL 20 MG/ML IJ SOLN
5.0000 mg | INTRAMUSCULAR | Status: AC | PRN
  Administered 2020-12-20 – 2020-12-24 (×2): 5 mg via INTRAVENOUS
  Filled 2020-12-16 (×2): qty 1

## 2020-12-16 MED ORDER — POTASSIUM CHLORIDE 20 MEQ PO PACK: 20.0000 meq | PACK | Freq: Every day | ORAL | Status: DC | PRN

## 2020-12-16 MED ORDER — HEPARIN 6000 UNIT IRRIGATION SOLUTION
Status: AC
  Filled 2020-12-16: qty 500

## 2020-12-16 MED ORDER — SUCCINYLCHOLINE CHLORIDE 20 MG/ML IJ SOLN
INTRAMUSCULAR | Status: AC | PRN
  Administered 2020-12-16: 160 mg via INTRAVENOUS

## 2020-12-16 MED ORDER — FENTANYL CITRATE (PF) 100 MCG/2ML IJ SOLN
50.0000 ug | Freq: Once | INTRAMUSCULAR | Status: AC
Start: 2020-12-16 — End: 2020-12-16

## 2020-12-16 MED ORDER — METOPROLOL TARTRATE 5 MG/5ML IV SOLN: 2.0000 mg | INTRAVENOUS | Status: DC | PRN

## 2020-12-16 MED ORDER — SODIUM BICARBONATE 8.4 % IV SOLN
INTRAVENOUS | Status: DC | PRN
  Administered 2020-12-16: 50 mL via INTRAVENOUS

## 2020-12-16 MED ORDER — PROPOFOL 1000 MG/100ML IV EMUL
INTRAVENOUS | Status: AC
  Filled 2020-12-16: qty 100

## 2020-12-16 MED ORDER — FENTANYL 2500MCG IN NS 250ML (10MCG/ML) PREMIX INFUSION
0.0000 ug/h | INTRAVENOUS | Status: DC
  Administered 2020-12-16: 50 ug/h via INTRAVENOUS
  Administered 2020-12-17 (×2): 200 ug/h via INTRAVENOUS
  Administered 2020-12-18: 400 ug/h via INTRAVENOUS
  Administered 2020-12-18: 300 ug/h via INTRAVENOUS
  Administered 2020-12-18: 200 ug/h via INTRAVENOUS
  Administered 2020-12-19: 400 ug/h via INTRAVENOUS
  Administered 2020-12-19: 300 ug/h via INTRAVENOUS
  Administered 2020-12-19 – 2020-12-20 (×2): 400 ug/h via INTRAVENOUS
  Administered 2020-12-20 (×2): 350 ug/h via INTRAVENOUS
  Administered 2020-12-20 – 2020-12-21 (×3): 400 ug/h via INTRAVENOUS
  Filled 2020-12-16 (×14): qty 250

## 2020-12-16 MED ORDER — PHENOL 1.4 % MT LIQD: 1.0000 | OROMUCOSAL | Status: DC | PRN

## 2020-12-16 MED ORDER — CHLORHEXIDINE GLUCONATE 0.12% ORAL RINSE (MEDLINE KIT): 15.0000 mL | Freq: Two times a day (BID) | OROMUCOSAL | Status: DC

## 2020-12-16 MED ORDER — SODIUM CHLORIDE 0.9% IV SOLUTION: Freq: Once | INTRAVENOUS | Status: DC

## 2020-12-16 MED ORDER — ONDANSETRON HCL 4 MG/2ML IJ SOLN
INTRAMUSCULAR | Status: AC
  Filled 2020-12-16: qty 2

## 2020-12-16 MED ORDER — CEFAZOLIN SODIUM-DEXTROSE 2-4 GM/100ML-% IV SOLN: 2.0000 g | INTRAVENOUS | Status: DC

## 2020-12-16 MED ORDER — LACTATED RINGERS IV BOLUS
2000.0000 mL | Freq: Once | INTRAVENOUS | Status: AC
  Administered 2020-12-16: 2000 mL via INTRAVENOUS

## 2020-12-16 MED ORDER — ONDANSETRON HCL 4 MG/2ML IJ SOLN
4.0000 mg | Freq: Four times a day (QID) | INTRAMUSCULAR | Status: DC | PRN
  Administered 2020-12-27 – 2020-12-29 (×5): 4 mg via INTRAVENOUS
  Filled 2020-12-16 (×6): qty 2

## 2020-12-16 MED ORDER — TETANUS-DIPHTH-ACELL PERTUSSIS 5-2.5-18.5 LF-MCG/0.5 IM SUSY
0.5000 mL | PREFILLED_SYRINGE | Freq: Once | INTRAMUSCULAR | Status: AC
  Administered 2020-12-16: 0.5 mL via INTRAMUSCULAR

## 2020-12-16 MED ORDER — CEFAZOLIN SODIUM-DEXTROSE 2-4 GM/100ML-% IV SOLN
2.0000 g | Freq: Three times a day (TID) | INTRAVENOUS | Status: AC
  Administered 2020-12-16 – 2020-12-17 (×2): 2 g via INTRAVENOUS
  Filled 2020-12-16 (×2): qty 100

## 2020-12-16 MED ORDER — ALBUMIN HUMAN 5 % IV SOLN: INTRAVENOUS | Status: DC | PRN

## 2020-12-16 MED ORDER — ROCURONIUM BROMIDE 10 MG/ML (PF) SYRINGE
PREFILLED_SYRINGE | INTRAVENOUS | Status: DC | PRN
  Administered 2020-12-16 (×2): 50 mg via INTRAVENOUS

## 2020-12-16 MED ORDER — ALUM & MAG HYDROXIDE-SIMETH 200-200-20 MG/5ML PO SUSP: 15.0000 mL | ORAL | Status: DC | PRN

## 2020-12-16 MED ORDER — CEFAZOLIN SODIUM-DEXTROSE 2-4 GM/100ML-% IV SOLN: 2.0000 g | Freq: Once | INTRAVENOUS | Status: DC

## 2020-12-16 MED ORDER — PANTOPRAZOLE SODIUM 40 MG PO TBEC: 40.0000 mg | DELAYED_RELEASE_TABLET | Freq: Every day | ORAL | Status: DC

## 2020-12-16 MED ORDER — KETAMINE HCL 50 MG/5ML IJ SOSY
PREFILLED_SYRINGE | INTRAMUSCULAR | Status: AC
  Filled 2020-12-16: qty 5

## 2020-12-16 MED ORDER — PROPOFOL 1000 MG/100ML IV EMUL: 5.0000 ug/kg/min | INTRAVENOUS | Status: DC

## 2020-12-16 MED ORDER — FENTANYL CITRATE (PF) 250 MCG/5ML IJ SOLN
INTRAMUSCULAR | Status: AC
  Filled 2020-12-16: qty 5

## 2020-12-16 MED ORDER — ROCURONIUM BROMIDE 10 MG/ML (PF) SYRINGE
PREFILLED_SYRINGE | INTRAVENOUS | Status: AC
  Filled 2020-12-16: qty 10

## 2020-12-16 MED ORDER — PIPERACILLIN-TAZOBACTAM 3.375 G IVPB 30 MIN
3.3750 g | Freq: Once | INTRAVENOUS | Status: AC
  Administered 2020-12-16: 3.375 g via INTRAVENOUS

## 2020-12-16 SURGICAL SUPPLY — 82 items
ALCOHOL 70% 16 OZ (MISCELLANEOUS) IMPLANT
BAG COUNTER SPONGE SURGICOUNT (BAG) ×3 IMPLANT
BALLN CODA OCL 2-10-35-140-46 (BALLOONS) ×3
BALLOON COD OCL 2-10-35-140-46 (BALLOONS) ×2 IMPLANT
BLADE SAGITTAL 25.0X1.19X90 (BLADE) ×3 IMPLANT
BLADE SURG 10 STRL SS (BLADE) ×3 IMPLANT
BNDG COHESIVE 1X5 TAN STRL LF (GAUZE/BANDAGES/DRESSINGS) IMPLANT
BNDG COHESIVE 4X5 TAN STRL (GAUZE/BANDAGES/DRESSINGS) IMPLANT
BNDG COHESIVE 6X5 TAN STRL LF (GAUZE/BANDAGES/DRESSINGS) IMPLANT
BNDG CONFORM 3 STRL LF (GAUZE/BANDAGES/DRESSINGS) IMPLANT
BNDG ELASTIC 3X5.8 VLCR STR LF (GAUZE/BANDAGES/DRESSINGS) IMPLANT
BNDG GAUZE ELAST 4 BULKY (GAUZE/BANDAGES/DRESSINGS) IMPLANT
CANISTER WOUND CARE 500ML ATS (WOUND CARE) ×3 IMPLANT
CATH ACCU-VU SIZ PIG 5F 100CM (CATHETERS) ×6 IMPLANT
CATH VISIONS PV .035 IVUS (CATHETERS) ×3 IMPLANT
CLOSURE PERCLOSE PROSTYLE (VASCULAR PRODUCTS) ×3 IMPLANT
CORD BIPOLAR FORCEPS 12FT (ELECTRODE) IMPLANT
COVER SURGICAL LIGHT HANDLE (MISCELLANEOUS) ×3 IMPLANT
CUFF TOURN SGL QUICK 24 (TOURNIQUET CUFF)
CUFF TOURN SGL QUICK 34 (TOURNIQUET CUFF) ×3
CUFF TOURN SGL QUICK 42 (TOURNIQUET CUFF) IMPLANT
CUFF TRNQT CYL 24X4X16.5-23 (TOURNIQUET CUFF) IMPLANT
CUFF TRNQT CYL 34X4.125X (TOURNIQUET CUFF) ×2 IMPLANT
DEVICE CLOSURE PERCLS PRGLD 6F (VASCULAR PRODUCTS) ×2 IMPLANT
DRAPE EXTREMITY BILATERAL (DRAPES) IMPLANT
DRAPE IMP U-DRAPE 54X76 (DRAPES) IMPLANT
DRAPE INCISE IOBAN 66X45 STRL (DRAPES) ×3 IMPLANT
DRAPE SURG 17X23 STRL (DRAPES) IMPLANT
DRAPE U-SHAPE 47X51 STRL (DRAPES) IMPLANT
DRSG PAD ABDOMINAL 8X10 ST (GAUZE/BANDAGES/DRESSINGS) IMPLANT
DRSG VAC ATS MED SENSATRAC (GAUZE/BANDAGES/DRESSINGS) ×3 IMPLANT
DRYSEAL FLEXSHEATH 20FR 33CM (SHEATH) ×1
DURAPREP 26ML APPLICATOR (WOUND CARE) IMPLANT
ELECT CAUTERY BLADE 6.4 (BLADE) IMPLANT
ELECT REM PT RETURN 9FT ADLT (ELECTROSURGICAL) ×3
ELECTRODE REM PT RTRN 9FT ADLT (ELECTROSURGICAL) ×2 IMPLANT
FACESHIELD WRAPAROUND (MASK) IMPLANT
GAUZE SPONGE 4X4 12PLY STRL (GAUZE/BANDAGES/DRESSINGS) IMPLANT
GAUZE XEROFORM 1X8 LF (GAUZE/BANDAGES/DRESSINGS) IMPLANT
GAUZE XEROFORM 5X9 LF (GAUZE/BANDAGES/DRESSINGS) IMPLANT
GLOVE SRG 8 PF TXTR STRL LF DI (GLOVE) ×4 IMPLANT
GLOVE SURG ENC MOIS LTX SZ7.5 (GLOVE) ×6 IMPLANT
GLOVE SURG UNDER POLY LF SZ8 (GLOVE) ×6
GOWN STRL REUS W/ TWL LRG LVL3 (GOWN DISPOSABLE) ×4 IMPLANT
GOWN STRL REUS W/ TWL XL LVL3 (GOWN DISPOSABLE) ×4 IMPLANT
GOWN STRL REUS W/TWL LRG LVL3 (GOWN DISPOSABLE) ×6
GOWN STRL REUS W/TWL XL LVL3 (GOWN DISPOSABLE) ×6
HANDPIECE INTERPULSE COAX TIP (DISPOSABLE)
KIT BASIN OR (CUSTOM PROCEDURE TRAY) ×3 IMPLANT
KIT TURNOVER KIT B (KITS) ×3 IMPLANT
MANIFOLD NEPTUNE II (INSTRUMENTS) IMPLANT
NS IRRIG 1000ML POUR BTL (IV SOLUTION) ×3 IMPLANT
PACK ORTHO EXTREMITY (CUSTOM PROCEDURE TRAY) ×3 IMPLANT
PAD ARMBOARD 7.5X6 YLW CONV (MISCELLANEOUS) ×6 IMPLANT
PADDING CAST ABS 4INX4YD NS (CAST SUPPLIES)
PADDING CAST ABS COTTON 4X4 ST (CAST SUPPLIES) IMPLANT
PADDING CAST COTTON 6X4 STRL (CAST SUPPLIES) IMPLANT
PERCLOSE PROGLIDE 6F (VASCULAR PRODUCTS) ×3
SET CYSTO W/LG BORE CLAMP LF (SET/KITS/TRAYS/PACK) IMPLANT
SET HNDPC FAN SPRY TIP SCT (DISPOSABLE) IMPLANT
SHEATH DRYSEAL FLEX 20FR 33CM (SHEATH) ×2 IMPLANT
SLEEVE ISOL F/PACE RF HD COVER (MISCELLANEOUS) ×3 IMPLANT
SPONGE T-LAP 18X18 ~~LOC~~+RFID (SPONGE) IMPLANT
STENT GRFT THORAC ACS 31X26X10 (Endovascular Graft) ×3 IMPLANT
STOCKINETTE IMPERVIOUS 9X36 MD (GAUZE/BANDAGES/DRESSINGS) IMPLANT
SUT ETHILON 2 0 FS 18 (SUTURE) IMPLANT
SUT ETHILON 2 0 PSLX (SUTURE) IMPLANT
SUT ETHILON 3 0 PS 1 (SUTURE) IMPLANT
SUT VIC AB 2-0 CT1 36 (SUTURE) IMPLANT
SUT VIC AB 2-0 FS1 27 (SUTURE) IMPLANT
SUT VIC AB 3-0 SH 27 (SUTURE) ×3
SUT VIC AB 3-0 SH 27X BRD (SUTURE) ×2 IMPLANT
SWAB CULTURE ESWAB REG 1ML (MISCELLANEOUS) IMPLANT
SYR CONTROL 10ML LL (SYRINGE) IMPLANT
TOWEL GREEN STERILE (TOWEL DISPOSABLE) ×3 IMPLANT
TOWEL GREEN STERILE FF (TOWEL DISPOSABLE) ×3 IMPLANT
TUBE CONNECTING 12X1/4 (SUCTIONS) ×3 IMPLANT
TUBE FEEDING ENTERAL 5FR 16IN (TUBING) IMPLANT
UNDERPAD 30X36 HEAVY ABSORB (UNDERPADS AND DIAPERS) ×6 IMPLANT
WATER STERILE IRR 1000ML POUR (IV SOLUTION) ×3 IMPLANT
WIRE STIFF LUNDERQUIST 260MM (WIRE) ×3 IMPLANT
YANKAUER SUCT BULB TIP NO VENT (SUCTIONS) ×3 IMPLANT
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
## 2020-12-16 NOTE — ED Notes (Signed)
Mother--- Laurena Bering [phone]
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
## 2020-12-16 NOTE — Brief Op Note (Signed)
12/16/2020  7:47 PM  PATIENT:  Thayer Headings  30 y.o. male  PRE-OPERATIVE DIAGNOSIS:  Traumatic Amputation Right Ankle  POST-OPERATIVE DIAGNOSIS:  Traumatic Amputation Right Ankle  PROCEDURE:  Procedure(s): PARTIAL RIGHT AMPUTATION BELOW KNEE (Right) THORACIC AORTIC ENDOVASCULAR STENT GRAFT (N/A)  SURGEON:  Surgeon(s) and Role: Panel 1:    * Yolonda Kida, MD - Primary Panel 2:    * Leonie Douglas, MD - Primary  PHYSICIAN ASSISTANT: Dion Saucier, PA-C  ASSISTANTS: Toni Arthurs, MD assisting procedure 1  ANESTHESIA:   general  EBL: 50 cc  BLOOD ADMINISTERED:none  DRAINS:  Wound VAC    LOCAL MEDICATIONS USED:  NONE  SPECIMEN:  No Specimen  DISPOSITION OF SPECIMEN:  N/A  COUNTS:  YES  TOURNIQUET:  * Missing tourniquet times found for documented tourniquets in log: [id number] *  DICTATION: .Note written in EPIC  PLAN OF CARE: Admit to inpatient   PATIENT DISPOSITION:  ICU - intubated and critically ill.   Delay start of Pharmacological VTE agent (>24hrs) due to surgical blood loss or risk of bleeding: not applicable
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
## 2020-12-16 NOTE — Progress Notes (Signed)
Patient transported to OR on vent. CRNA took over with BMV. RN at bedside.
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
## 2020-12-16 NOTE — ED Provider Notes (Signed)
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 pneumothorax --Aortic transection with associated dissection flap --Grade 3/4 liver laceration -UA: Large amount of RBCs -CMP: Bicarb 13, AG 22, creatinine 1.56, AST/ALT 594/331 -EtOH: 11 -Lactate: >11 -Unremarkable: CBC, PT/INR  Trauma, orthopedic, and vascular surgery teams on board, will be taking patient directly to the OR.  Patient remained HDS, intubated on ventilator, subsequently transported to the OR.  Final Clinical Impression(s) / ED Diagnoses Final diagnoses:  Trauma  Dissection of thoracic aorta (HCC)  Partial traumatic amputation of lower leg, right, initial encounter [facility])    Rx / DC Orders ED Discharge Orders     None        Colvin Caroli, MD 12/17/20 0255    Tilden Fossa, MD 12/20/20 1555

## 2020-12-16 NOTE — Transfer of Care (Signed)
Immediate Anesthesia Transfer of Care Note  Patient: Thayer Headings  Procedure(s) Performed: PARTIAL RIGHT AMPUTATION BELOW KNEE (Right: Knee) THORACIC AORTIC ENDOVASCULAR STENT GRAFT  Patient Location: ICU  Anesthesia Type:General  Level of Consciousness: Patient remains intubated per anesthesia plan  Airway & Oxygen Therapy: Patient remains intubated per anesthesia plan and Patient placed on Ventilator (see vital sign flow sheet for setting)  Post-op Assessment: Report given to RN and Post -op Vital signs reviewed and stable  Post vital signs: Reviewed and stable  Last Vitals:  Vitals Value Taken Time  BP 88/72 12/16/20 2018  Temp 35.2 C 12/16/20 2026  Pulse 90 12/16/20 2020  Resp 20 12/16/20 2026  SpO2 100 % 12/16/20 2020  Vitals shown include unvalidated device data.  Last Pain:  Vitals:   12/16/20 1631  TempSrc: Axillary  PainSc:          Complications: No notable events documented.

## 2020-12-17 ENCOUNTER — Inpatient Hospital Stay (HOSPITAL_COMMUNITY): Payer: Self-pay

## 2020-12-17 ENCOUNTER — Other Ambulatory Visit: Payer: Self-pay

## 2020-12-17 DIAGNOSIS — S2509XA Other specified injury of thoracic aorta, initial encounter: Secondary | ICD-10-CM

## 2020-12-17 LAB — CBC
HCT: 21.2 % — ABNORMAL LOW (ref 39.0–52.0)
HCT: 19.9 % — ABNORMAL LOW (ref 39.0–52.0)
HCT: 22.4 % — ABNORMAL LOW (ref 39.0–52.0)
Hemoglobin: 7 g/dL — ABNORMAL LOW (ref 13.0–17.0)
Hemoglobin: 6.7 g/dL — CL (ref 13.0–17.0)
Hemoglobin: 7.6 g/dL — ABNORMAL LOW (ref 13.0–17.0)
MCH: 33.3 pg (ref 26.0–34.0)
MCH: 31.3 pg (ref 26.0–34.0)
MCH: 30.8 pg (ref 26.0–34.0)
MCHC: 33 g/dL (ref 30.0–36.0)
MCHC: 33.7 g/dL (ref 30.0–36.0)
MCHC: 33.9 g/dL (ref 30.0–36.0)
MCV: 101 fL — ABNORMAL HIGH (ref 80.0–100.0)
MCV: 93 fL (ref 80.0–100.0)
MCV: 90.7 fL (ref 80.0–100.0)
Platelets: 124 10*3/uL — ABNORMAL LOW (ref 150–400)
Platelets: 100 10*3/uL — ABNORMAL LOW (ref 150–400)
Platelets: 96 10*3/uL — ABNORMAL LOW (ref 150–400)
RBC: 2.1 MIL/uL — ABNORMAL LOW (ref 4.22–5.81)
RBC: 2.14 MIL/uL — ABNORMAL LOW (ref 4.22–5.81)
RBC: 2.47 MIL/uL — ABNORMAL LOW (ref 4.22–5.81)
RDW: 12.7 % (ref 11.5–15.5)
RDW: 16.9 % — ABNORMAL HIGH (ref 11.5–15.5)
RDW: 18 % — ABNORMAL HIGH (ref 11.5–15.5)
WBC: 11.1 10*3/uL — ABNORMAL HIGH (ref 4.0–10.5)
WBC: 6.4 10*3/uL (ref 4.0–10.5)
WBC: 7.8 10*3/uL (ref 4.0–10.5)
nRBC: 0 % (ref 0.0–0.2)
nRBC: 0 % (ref 0.0–0.2)
nRBC: 0 % (ref 0.0–0.2)

## 2020-12-17 LAB — PREPARE RBC (CROSSMATCH)

## 2020-12-17 LAB — BASIC METABOLIC PANEL
Anion gap: 6 (ref 5–15)
BUN: 14 mg/dL (ref 6–20)
CO2: 21 mmol/L — ABNORMAL LOW (ref 22–32)
Calcium: 7 mg/dL — ABNORMAL LOW (ref 8.9–10.3)
Chloride: 110 mmol/L (ref 98–111)
Creatinine, Ser: 1.6 mg/dL — ABNORMAL HIGH (ref 0.61–1.24)
GFR, Estimated: 59 mL/min — ABNORMAL LOW (ref >60)
Glucose, Bld: 102 mg/dL — ABNORMAL HIGH (ref 70–99)
Potassium: 4.4 mmol/L (ref 3.5–5.1)
Sodium: 137 mmol/L (ref 135–145)

## 2020-12-17 LAB — TRIGLYCERIDES: Triglycerides: 168 mg/dL — ABNORMAL HIGH (ref <150)

## 2020-12-17 LAB — MRSA NEXT GEN BY PCR, NASAL: MRSA by PCR Next Gen: NOT DETECTED

## 2020-12-17 LAB — BLOOD PRODUCT ORDER (VERBAL) VERIFICATION

## 2020-12-17 MED ORDER — PHENYLEPHRINE HCL-NACL 10-0.9 MG/250ML-% IV SOLN
0.0000 ug/min | INTRAVENOUS | Status: DC
  Administered 2020-12-17: 20 ug/min via INTRAVENOUS

## 2020-12-17 MED ORDER — SODIUM CHLORIDE 0.9% IV SOLUTION: Freq: Once | INTRAVENOUS | Status: DC

## 2020-12-17 MED ORDER — QUETIAPINE FUMARATE 100 MG PO TABS
100.0000 mg | ORAL_TABLET | Freq: Two times a day (BID) | ORAL | Status: DC
  Administered 2020-12-17 – 2020-12-19 (×6): 100 mg
  Filled 2020-12-17 (×7): qty 1

## 2020-12-17 MED ORDER — ALBUMIN HUMAN 5 % IV SOLN
25.0000 g | Freq: Once | INTRAVENOUS | Status: AC
  Administered 2020-12-17: 25 g via INTRAVENOUS
  Filled 2020-12-17: qty 500

## 2020-12-17 MED ORDER — SODIUM CHLORIDE 0.9 % IV SOLN: INTRAVENOUS | Status: DC

## 2020-12-17 MED ORDER — CLONAZEPAM 0.5 MG PO TABS
0.5000 mg | ORAL_TABLET | Freq: Two times a day (BID) | ORAL | Status: DC
  Administered 2020-12-17 – 2020-12-19 (×6): 0.5 mg
  Filled 2020-12-17 (×6): qty 1

## 2020-12-17 NOTE — Progress Notes (Signed)
RT NOTES: ETT advanced 3cm from 25cm at the lip to 28cm at the lip per MD order.

## 2020-12-17 NOTE — Progress Notes (Signed)
Propofol weaned down to 40 (was at 80). RT tried to wean pt again. Pt unable to initiate breaths at this time.

## 2020-12-17 NOTE — Progress Notes (Signed)
RT NOTES: Pt tried on CPAP/PS mode, patient went apneic. No patient effort. Will try again later when patient is less sedated.

## 2020-12-17 NOTE — Progress Notes (Signed)
Patient ID: Christian Hartman, male   DOB: [birth year], 30 y.o.   MRN: [id number] I updated his fiancee and aunt at the bedside.  Violeta Gelinas, MD, MPH, FACS Please use [email or web address] to contact on call provider

## 2020-12-17 NOTE — Progress Notes (Addendum)
  Progress Note    12/17/2020 8:17 AM 1 Day Post-Op  Subjective:  sedated on vent   Vitals:   12/17/20 0600 12/17/20 0741  BP: (!) 91/55 (!) 106/57  Pulse:  (!) 125  Resp: 20 20  Temp: 100.22 F (37.9 C)   SpO2:  100%   Physical Exam: Cardiac:  regular Lungs:  on vent Extremities:  right lower extremity amputation with VAC to suction. Left femoral access site clean dry and intact without hematoma. Left upper arm warm. Doppler brachial and radial pulses present Neurologic: sedated on vent  CBC    Component Value Date/Time   WBC 11.1 (H) 12/17/2020 0500   RBC 2.10 (L) 12/17/2020 0500   HGB 7.0 (L) 12/17/2020 0500   HCT 21.2 (L) 12/17/2020 0500   PLT 124 (L) 12/17/2020 0500   MCV 101.0 (H) 12/17/2020 0500   MCH 33.3 12/17/2020 0500   MCHC 33.0 12/17/2020 0500   RDW 12.7 12/17/2020 0500    BMET    Component Value Date/Time   NA 137 12/17/2020 0500   K 4.4 12/17/2020 0500   CL 110 12/17/2020 0500   CO2 21 (L) 12/17/2020 0500   GLUCOSE 102 (H) 12/17/2020 0500   BUN 14 12/17/2020 0500   CREATININE 1.60 (H) 12/17/2020 0500   CALCIUM 7.0 (L) 12/17/2020 0500   GFRNONAA 59 (L) 12/17/2020 0500    INR    Component Value Date/Time   INR 1.2 12/16/2020 1633     Intake/Output Summary (Last 24 hours) at 12/17/2020 0817 Last data filed at 12/17/2020 0600 Gross per 24 hour  Intake 6768.07 ml  Output 1950 ml  Net 4818.07 ml     Assessment/Plan:  30 y.o. male is s/p  1) left common femoral artery US guided access and large bore closure 2) thoracic endovascular aortic repair (TEVAR) with intentional coverage of left subclavian artery (Gore cTAG 31 x 26 x )   1 Day Post-Op   Stable post operatively. VSS Left upper extremity with doppler brachial and radial signals Unable to assess any neuro deficits in upper extremity at this time due to sedation Left femoral access site is c/d/I without swelling or hematoma Aspirin when okay with Trauma HGB 7 I unit ordered  for transfusion Medical management per primary team   DVT prophylaxis:  SCD   Dory Horn [facility] [phone] 12/17/2020 8:17 AM  VASCULAR STAFF ADDENDUM: I have independently interviewed and examined the patient. I agree with the above.  Grade III/IV BTAI after [facility] repaired with TEVAR with intentional coverage of LSA.  Good doppler flow in left arm this AM.  Would only consider LCCA-LSCA bypass if he had spinal cord ischemia or left arm acute ischemia. No evidence of either today. ASA 81mg  PO QD as soon as safe per trauma. Following.  . Rande Brunt, MD [facility] Phone Number: [phone] 12/17/2020 9:52 AM

## 2020-12-17 NOTE — Progress Notes (Signed)
Wound vac alarming "canister not engaged" despite changing canister and entire wound vac system. Notified MD. Orders to change dressing orders to wet and dry. Will continue to monitor.

## 2020-12-17 NOTE — TOC CAGE-AID Note (Signed)
Transition of Care [facility]) - CAGE-AID Screening   Patient Details  Name: Christian Hartman MRN: [id number] Date of Birth: [birth year]  Transition of Care [facility]) CM/SW Contact:    Emillee Talsma C Tarpley-Carter, LCSWA Phone Number: 12/17/2020, 12:51 PM   Clinical Narrative: Pt is unable to participate in Cage Aid at this time.   Katilin Raynes Tarpley-Carter, MSW, LCSW-A Pronouns:  She/Her/Hers                          [place] Clinical Social WorkerTransitions of Care Cell:  [phone] [email or web address]   CAGE-AID Screening: Substance Abuse Screening unable to be completed due to: : Patient unable to participate

## 2020-12-17 NOTE — Progress Notes (Signed)
Initial Nutrition Assessment  DOCUMENTATION CODES:   Not applicable  INTERVENTION:    As BP and pressors are stable recommend Pivot 1.5 @ 60 ml/h  Provides: 2160 kcal, 135 grams protein, and 1092 ml free water.  With current propofol provides: 2608 kcal    NUTRITION DIAGNOSIS:   Increased nutrient needs related to post-op healing as evidenced by estimated needs.  GOAL:   Patient will meet greater than or equal to 90% of their needs  MONITOR:   Skin, I & O's  REASON FOR ASSESSMENT:   Ventilator    ASSESSMENT:   Pt admitted after [facility] with near amputation R distal tib/fib, L PTX, traumatic pseudoaneurysm of descending thoracic aorta, and G3-4 liver lac.   Pt discussed during ICU rounds and with RN.  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   Diet Order:   Diet Order             Diet NPO time specified  Diet effective ____                   EDUCATION NEEDS:   Not appropriate for education at this time  Skin:  Skin Assessment:  (R BKA with VAC)  Last BM:  unknown  Height:   Ht Readings from Last 1 Encounters:  12/16/20 5\' 10"  (1.778 m)    Weight:   Wt Readings from Last 1 Encounters:  12/16/20 72.6 kg    BMI:  Body mass index is 22.96 kg/m.  Estimated Nutritional Needs:   Kcal:  2300-2600  Protein:  125-135 grams  Fluid:  > 2 L/day  12/18/20., RD, LDN, CNSC See AMiON for contact information

## 2020-12-17 NOTE — Progress Notes (Signed)
Patient ID: Christian Hartman, male   DOB: 04/18/1991, 30 y.o.   MRN: [id number] Follow up - Trauma Critical Care  Patient Details:    Christian HeadingsDarius Mechling is an 30 y.o. male.  Lines/tubes : Airway 7.5 mm (Active)  Secured at (cm) 25 cm 12/17/20 0741  Measured From Lips 12/17/20 0741  Secured Location Right 12/17/20 0741  Secured By [employer] Tube Holder 12/17/20 0741  Tube Holder Repositioned Yes 12/17/20 0741  Cuff Pressure (cm H2O) Clear OR 27-39 CmH2O 12/17/20 0741  Site Condition Dry 12/17/20 0741     Arterial Line 12/16/20 Left Radial (Active)     Negative Pressure Wound Therapy Leg Right;Lower (Active)  Site / Wound Assessment Clean;Dry 12/16/20 2015  Cycle Continuous 12/16/20 2015  Target Pressure (mmHg) 125 12/16/20 2015  Dressing Status Intact 12/16/20 2015  Output (mL) 150 mL 12/17/20 0000     NG/OG Vented/Dual Lumen Orogastric 18 Fr. (Active)  Site Assessment Clean;Intact;Dry 12/16/20 2015  Interventions Clamped 12/16/20 2015  Status Clamped 12/16/20 2015     Urethral Catheter Verlon AuLeslie RN  Temperature probe 16 Fr. (Active)  Indication for Insertion or Continuance of Catheter Unstable critically ill patients first 24-48 hours (See Criteria) 12/16/20 2015  Site Assessment Clean;Intact 12/16/20 2015  Catheter Maintenance Bag below level of bladder;Catheter secured;Drainage bag/tubing not touching floor 12/16/20 2015  Collection Container Standard drainage bag 12/16/20 2015  Securement Method Securing device (Describe) 12/16/20 2015  Urinary Catheter Interventions (if applicable) Unclamped 12/16/20 1752  Output (mL) 325 mL 12/17/20 0600    Microbiology/Sepsis markers: Results for orders placed or performed during the hospital encounter of 12/16/20  Resp Panel by RT-PCR (Flu A&B, Covid) Nasopharyngeal Swab     Status: None   Collection Time: 12/16/20  6:10 PM   Specimen: Nasopharyngeal Swab; Nasopharyngeal(NP) swabs in vial transport medium  Result Value Ref Range Status    SARS Coronavirus 2 by RT PCR NEGATIVE NEGATIVE Final    Comment: (NOTE) SARS-CoV-2 target nucleic acids are NOT DETECTED.  The SARS-CoV-2 RNA is generally detectable in upper respiratory specimens during the acute phase of infection. The lowest concentration of SARS-CoV-2 viral copies this assay can detect is 138 copies/mL. A negative result does not preclude SARS-Cov-2 infection and should not be used as the sole basis for treatment or other patient management decisions. A negative result may occur with  improper specimen collection/handling, submission of specimen other than nasopharyngeal swab, presence of viral mutation(s) within the areas targeted by this assay, and inadequate number of viral copies(<138 copies/mL). A negative result must be combined with clinical observations, patient history, and epidemiological information. The expected result is Negative.  Fact Sheet for Patients:  BloggerCourse.comhttps://www.fda.gov/media/152166/download  Fact Sheet for Healthcare Providers:  SeriousBroker.ithttps://www.fda.gov/media/152162/download  This test is no t yet approved or cleared by the [place] FDA and  has been authorized for detection and/or diagnosis of SARS-CoV-2 by FDA under an Emergency Use Authorization (EUA). This EUA will remain  in effect (meaning this test can be used) for the duration of the COVID-19 declaration under Section 564(b)(1) of the Act, 21 U.S.C.section 360bbb-3(b)(1), unless the authorization is terminated  or revoked sooner.       Influenza A by PCR NEGATIVE NEGATIVE Final   Influenza B by PCR NEGATIVE NEGATIVE Final    Comment: (NOTE) The Xpert Xpress SARS-CoV-2/FLU/RSV plus assay is intended as an aid in the diagnosis of influenza from Nasopharyngeal swab specimens and should not be used as a sole basis for treatment. Nasal washings and aspirates  are unacceptable for Xpert Xpress SARS-CoV-2/FLU/RSV testing.  Fact Sheet for  Patients: BloggerCourse.com  Fact Sheet for Healthcare Providers: SeriousBroker.it  This test is not yet approved or cleared by the [place] FDA and has been authorized for detection and/or diagnosis of SARS-CoV-2 by FDA under an Emergency Use Authorization (EUA). This EUA will remain in effect (meaning this test can be used) for the duration of the COVID-19 declaration under Section 564(b)(1) of the Act, 21 U.S.C. section 360bbb-3(b)(1), unless the authorization is terminated or revoked.  Performed at [facility], [address] [place], [place] [address]   MRSA Next Gen by PCR, Nasal     Status: None   Collection Time: 12/16/20  8:30 PM   Specimen: Nasal Mucosa; Nasal Swab  Result Value Ref Range Status   MRSA by PCR Next Gen NOT DETECTED NOT DETECTED Final    Comment: (NOTE) The GeneXpert MRSA Assay (FDA approved for NASAL specimens only), is one component of a comprehensive MRSA colonization surveillance program. It is not intended to diagnose MRSA infection nor to guide or monitor treatment for MRSA infections. Test performance is not FDA approved in patients less than 44 years old. Performed at [facility], [address], [place], [place] [address]     Anti-infectives:  Anti-infectives (From admission, onward)    Start     Dose/Rate Route Frequency Ordered Stop   12/17/20 0600  ceFAZolin (ANCEF) IVPB 2g/100 mL premix  Status:  Discontinued        2 g 200 mL/hr over 30 Minutes Intravenous On call to O.R. 12/16/20 2040 12/16/20 2047   12/16/20 2200  ceFAZolin (ANCEF) IVPB 2g/100 mL premix        2 g 200 mL/hr over 30 Minutes Intravenous Every 8 hours 12/16/20 2043 12/17/20 0626   12/16/20 1700  piperacillin-tazobactam (ZOSYN) IVPB 3.375 g        3.375 g 100 mL/hr over 30 Minutes Intravenous  Once 12/16/20 1656 12/16/20 1723   12/16/20 1645  ceFAZolin (ANCEF) IVPB 2g/100 mL premix  Status:   Discontinued        2 g 200 mL/hr over 30 Minutes Intravenous  Once 12/16/20 1637 12/16/20 1656      Consults: Treatment Team:  Yolonda Kida, MD Leonie Douglas, MD Myrene Galas, MD    Studies:    Events:  Subjective:    Overnight Issues:   Objective:  Vital signs for last 24 hours: Temp:  [95.2 F (35.1 C)-100.22 F (37.9 C)] 100.22 F (37.9 C) (07/15 0600) Pulse Rate:  [99-156] 125 (07/15 0741) Resp:  [14-27] 20 (07/15 0741) BP: (76-176)/(55-106) 106/57 (07/15 0741) SpO2:  [97 %-100 %] 100 % (07/15 0741) Arterial Line BP: (79-155)/(43-81) 79/51 (07/15 0600) FiO2 (%):  [40 %-100 %] 40 % (07/15 0741) Weight:  [72.6 kg] 72.6 kg (07/14 1630)  Hemodynamic parameters for last 24 hours:    Intake/Output from previous day: 07/14 0701 - 07/15 0700 In: 6768.1 [I.V.:4405.4; IV Piggyback:2362.7] Out: 1950 [Urine:1450; Drains:300; Blood:200]  Intake/Output this shift: No intake/output data recorded.  Vent settings for last 24 hours: Vent Mode: PRVC FiO2 (%):  [40 %-100 %] 40 % Set Rate:  [18 bmp-20 bmp] 20 bmp Vt Set:  [580 mL-600 mL] 580 mL PEEP:  [5 cmH20] 5 cmH20 Plateau Pressure:  [18 cmH20-22 cmH20] 20 cmH20  Physical Exam:  General: on vent Neuro: on vent, gets agitated HEENT/Neck: ETT Resp: clear to auscultation bilaterally CVS: tachy GI: soft,  NT Extremities: R BKA  Results for orders placed or performed during the hospital encounter of 12/16/20 (from the past 24 hour(s))  Urinalysis, Routine w reflex microscopic Nasopharyngeal Swab     Status: Abnormal   Collection Time: 12/16/20  4:24 PM  Result Value Ref Range   Color, Urine YELLOW YELLOW   APPearance HAZY (A) CLEAR   Specific Gravity, Urine 1.028 1.005 - 1.030   pH 7.0 5.0 - 8.0   Glucose, UA 50 (A) NEGATIVE mg/dL   Hgb urine dipstick LARGE (A) NEGATIVE   Bilirubin Urine NEGATIVE NEGATIVE   Ketones, ur NEGATIVE NEGATIVE mg/dL   Protein, ur 893 (A) NEGATIVE mg/dL   Nitrite  NEGATIVE NEGATIVE   Leukocytes,Ua NEGATIVE NEGATIVE   RBC / HPF >50 (H) 0 - 5 RBC/hpf   WBC, UA 11-20 0 - 5 WBC/hpf   Bacteria, UA RARE (A) NONE SEEN   Hyaline Casts, UA PRESENT   Comprehensive metabolic panel     Status: Abnormal   Collection Time: 12/16/20  4:33 PM  Result Value Ref Range   Sodium 138 135 - 145 mmol/L   Potassium 3.8 3.5 - 5.1 mmol/L   Chloride 103 98 - 111 mmol/L   CO2 13 (L) 22 - 32 mmol/L   Glucose, Bld 185 (H) 70 - 99 mg/dL   BUN 14 6 - 20 mg/dL   Creatinine, Ser 8.10 (H) 0.61 - 1.24 mg/dL   Calcium 9.1 8.9 - 17.5 mg/dL   Total Protein 7.4 6.5 - 8.1 g/dL   Albumin 4.1 3.5 - 5.0 g/dL   AST 102 (H) 15 - 41 U/L   ALT 331 (H) 0 - 44 U/L   Alkaline Phosphatase 62 38 - 126 U/L   Total Bilirubin 1.2 0.3 - 1.2 mg/dL   GFR, Estimated >58 >52 mL/min   Anion gap 22 (H) 5 - 15  CBC     Status: Abnormal   Collection Time: 12/16/20  4:33 PM  Result Value Ref Range   WBC 9.5 4.0 - 10.5 K/uL   RBC 4.33 4.22 - 5.81 MIL/uL   Hemoglobin 14.4 13.0 - 17.0 g/dL   HCT 77.8 24.2 - 35.3 %   MCV 103.5 (H) 80.0 - 100.0 fL   MCH 33.3 26.0 - 34.0 pg   MCHC 32.1 30.0 - 36.0 g/dL   RDW 61.4 43.1 - 54.0 %   Platelets 234 150 - 400 K/uL   nRBC 0.0 0.0 - 0.2 %  Ethanol     Status: Abnormal   Collection Time: 12/16/20  4:33 PM  Result Value Ref Range   Alcohol, Ethyl (B) 11 (H) <10 mg/dL  Lactic acid, plasma     Status: Abnormal   Collection Time: 12/16/20  4:33 PM  Result Value Ref Range   Lactic Acid, Venous >11.0 (HH) 0.5 - 1.9 mmol/L  Protime-INR     Status: None   Collection Time: 12/16/20  4:33 PM  Result Value Ref Range   Prothrombin Time 14.9 11.4 - 15.2 seconds   INR 1.2 0.8 - 1.2  Sample to Blood Bank     Status: None   Collection Time: 12/16/20  4:33 PM  Result Value Ref Range   Blood Bank Specimen SAMPLE AVAILABLE FOR TESTING    Sample Expiration      12/17/2020,2359 Performed at [facility], [address], [place], [place] [address]   Type and  screen     Status: None (Preliminary result)   Collection Time: 12/16/20  4:33 PM  Result Value Ref Range   ABO/RH(D) O NEG    Antibody Screen NEG    Sample Expiration 12/19/2020,2359    Unit Number [id number]    Blood Component Type RED CELLS,LR    Unit division 00    Status of Unit ALLOCATED    Transfusion Status OK TO TRANSFUSE    Crossmatch Result      COMPATIBLE Performed at [facility], [address] [place], [place] [address]    Unit Number [id number]    Blood Component Type RED CELLS,LR    Unit division 00    Status of Unit ALLOCATED    Transfusion Status OK TO TRANSFUSE    Crossmatch Result COMPATIBLE    Unit Number [id number]    Blood Component Type RED CELLS,LR    Unit division 00    Status of Unit ALLOCATED    Transfusion Status OK TO TRANSFUSE    Crossmatch Result COMPATIBLE    Unit Number [id number]    Blood Component Type RED CELLS,LR    Unit division 00    Status of Unit ALLOCATED    Transfusion Status OK TO TRANSFUSE    Crossmatch Result COMPATIBLE   I-Stat Chem 8, ED     Status: Abnormal   Collection Time: 12/16/20  4:34 PM  Result Value Ref Range   Sodium 138 135 - 145 mmol/L   Potassium 3.7 3.5 - 5.1 mmol/L   Chloride 107 98 - 111 mmol/L   BUN 15 6 - 20 mg/dL   Creatinine, Ser 6.44 (H) 0.61 - 1.24 mg/dL   Glucose, Bld 034 (H) 70 - 99 mg/dL   Calcium, Ion 7.42 (L) 1.15 - 1.40 mmol/L   TCO2 14 (L) 22 - 32 mmol/L   Hemoglobin 16.0 13.0 - 17.0 g/dL   HCT 59.5 63.8 - 75.6 %  I-Stat arterial blood gas, ED     Status: Abnormal   Collection Time: 12/16/20  5:27 PM  Result Value Ref Range   pH, Arterial 7.285 (L) 7.350 - 7.450   pCO2 arterial 40.9 32.0 - 48.0 mmHg   pO2, Arterial 180 (H) 83.0 - 108.0 mmHg   Bicarbonate 19.6 (L) 20.0 - 28.0 mmol/L   TCO2 21 (L) 22 - 32 mmol/L   O2 Saturation 99.0 %   Acid-base deficit 7.0 (H) 0.0 - 2.0 mmol/L   Sodium 136 135 - 145 mmol/L   Potassium 3.4 (L) 3.5 - 5.1 mmol/L   Calcium, Ion 1.14  (L) 1.15 - 1.40 mmol/L   HCT 34.0 (L) 39.0 - 52.0 %   Hemoglobin 11.6 (L) 13.0 - 17.0 g/dL   Patient temperature 43.3 F    Collection site Radial    Drawn by RT    Sample type ARTERIAL   Resp Panel by RT-PCR (Flu A&B, Covid) Nasopharyngeal Swab     Status: None   Collection Time: 12/16/20  6:10 PM   Specimen: Nasopharyngeal Swab; Nasopharyngeal(NP) swabs in vial transport medium  Result Value Ref Range   SARS Coronavirus 2 by RT PCR NEGATIVE NEGATIVE   Influenza A by PCR NEGATIVE NEGATIVE   Influenza B by PCR NEGATIVE NEGATIVE  I-STAT 7, (LYTES, BLD GAS, ICA, H+H)     Status: Abnormal   Collection Time: 12/16/20  6:56 PM  Result Value Ref Range   pH, Arterial 7.169 (LL) 7.350 - 7.450   pCO2 arterial 55.1 (H) 32.0 - 48.0 mmHg   pO2, Arterial 82 (L) 83.0 - 108.0 mmHg  Bicarbonate 20.9 20.0 - 28.0 mmol/L   TCO2 23 22 - 32 mmol/L   O2 Saturation 95.0 %   Acid-base deficit 8.0 (H) 0.0 - 2.0 mmol/L   Sodium 138 135 - 145 mmol/L   Potassium 4.1 3.5 - 5.1 mmol/L   Calcium, Ion 1.20 1.15 - 1.40 mmol/L   HCT 30.0 (L) 39.0 - 52.0 %   Hemoglobin 10.2 (L) 13.0 - 17.0 g/dL   Patient temperature 23.5 C    Sample type ARTERIAL    Comment NOTIFIED PHYSICIAN   Prepare RBC (crossmatch)     Status: None   Collection Time: 12/16/20  7:03 PM  Result Value Ref Range   Order Confirmation      ORDER PROCESSED BY BLOOD BANK Performed at [facility], [address], [place], [place] [address]   ABO/Rh     Status: None   Collection Time: 12/16/20  7:10 PM  Result Value Ref Range   ABO/RH(D)      O NEG Performed at [facility], [address] [place], [place] [address]   MRSA Next Gen by PCR, Nasal     Status: None   Collection Time: 12/16/20  8:30 PM   Specimen: Nasal Mucosa; Nasal Swab  Result Value Ref Range   MRSA by PCR Next Gen NOT DETECTED NOT DETECTED  CBC     Status: Abnormal   Collection Time: 12/16/20  9:10 PM  Result Value Ref Range   WBC 21.6 (H) 4.0 - 10.5 K/uL    RBC 2.65 (L) 4.22 - 5.81 MIL/uL   Hemoglobin 8.9 (L) 13.0 - 17.0 g/dL   HCT 70.6 (L) 23.7 - 62.8 %   MCV 97.7 80.0 - 100.0 fL   MCH 33.6 26.0 - 34.0 pg   MCHC 34.4 30.0 - 36.0 g/dL   RDW 31.5 17.6 - 16.0 %   Platelets 159 150 - 400 K/uL   nRBC 0.1 0.0 - 0.2 %  I-STAT 7, (LYTES, BLD GAS, ICA, H+H)     Status: Abnormal   Collection Time: 12/16/20  9:47 PM  Result Value Ref Range   pH, Arterial 7.403 7.350 - 7.450   pCO2 arterial 32.5 32.0 - 48.0 mmHg   pO2, Arterial 106 83.0 - 108.0 mmHg   Bicarbonate 20.5 20.0 - 28.0 mmol/L   TCO2 22 22 - 32 mmol/L   O2 Saturation 98.0 %   Acid-base deficit 4.0 (H) 0.0 - 2.0 mmol/L   Sodium 142 135 - 145 mmol/L   Potassium 3.6 3.5 - 5.1 mmol/L   Calcium, Ion 1.03 (L) 1.15 - 1.40 mmol/L   HCT 23.0 (L) 39.0 - 52.0 %   Hemoglobin 7.8 (L) 13.0 - 17.0 g/dL   Patient temperature 73.7 F    Collection site art line    Drawn by HIDE    Sample type ARTERIAL   CBC     Status: Abnormal   Collection Time: 12/17/20  5:00 AM  Result Value Ref Range   WBC 11.1 (H) 4.0 - 10.5 K/uL   RBC 2.10 (L) 4.22 - 5.81 MIL/uL   Hemoglobin 7.0 (L) 13.0 - 17.0 g/dL   HCT 10.6 (L) 26.9 - 48.5 %   MCV 101.0 (H) 80.0 - 100.0 fL   MCH 33.3 26.0 - 34.0 pg   MCHC 33.0 30.0 - 36.0 g/dL   RDW 46.2 70.3 - 50.0 %   Platelets 124 (L) 150 - 400 K/uL   nRBC 0.0 0.0 - 0.2 %  Basic metabolic panel     Status: Abnormal (Preliminary result)   Collection Time: 12/17/20  5:00 AM  Result Value Ref Range   Sodium 137 135 - 145 mmol/L   Potassium 4.4 3.5 - 5.1 mmol/L   Chloride 110 98 - 111 mmol/L   CO2 21 (L) 22 - 32 mmol/L   Glucose, Bld 102 (H) 70 - 99 mg/dL   BUN 14 6 - 20 mg/dL   Creatinine, Ser 8.75 (H) 0.61 - 1.24 mg/dL   Calcium PENDING 8.9 - 10.3 mg/dL   GFR, Estimated 59 (L) >60 mL/min   Anion gap 6 5 - 15  Triglycerides     Status: Abnormal   Collection Time: 12/17/20  5:00 AM  Result Value Ref Range   Triglycerides 168 (H) <150 mg/dL    Assessment &  Plan: Present on Admission: **None**    LOS: 1 day   Additional comments:I reviewed the patient's new clinical lab test results. And CXR [facility]  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 Aundria Rud 7/14, revision planned by Dr. Carola Frost Occult L PTX - CXR today Acute hypoxic ventilator dependent respiratory failure - begin weaning Aoritc transection with associated dissection flap - S/P TEVAR by Dr. Lenell Antu 7/14 G3 liver laceration - serial CBC, bedrest until 7/17 PM FEN - NPO, no TF as may be able to extubate, IVF AKI - fluids, F/U ABL anemia - TF 1u PRBC now DVT - SCD only Dispo - ICU, vent wean Critical Care Total Time*: 41 Minutes  Violeta Gelinas, MD, MPH, FACS Trauma & General Surgery Use [email or web address] to contact on call provider  12/17/2020  *Care during the described time interval was provided by me. I have reviewed this patient's available data, including medical history, events of note, physical examination and test results as part of my evaluation.

## 2020-12-17 NOTE — Anesthesia Procedure Notes (Addendum)
Arterial Line Insertion Start/End7/14/2022 6:40 PM, 12/16/2020 6:43 PM Performed by: Atilano Median, DO, anesthesiologist  Patient location: OR. Preanesthetic checklist: patient identified, IV checked, monitors and equipment checked and pre-op evaluation Emergency situation Left, radial was placed Catheter size: 20 Fr Hand hygiene performed  and maximum sterile barriers used   Attempts: 1 Procedure performed without using ultrasound guided technique. Following insertion, dressing applied and Biopatch. Post procedure assessment: normal and unchanged  Patient tolerated the procedure well with no immediate complications.

## 2020-12-17 NOTE — Anesthesia Postprocedure Evaluation (Signed)
Anesthesia Post Note  Patient: Christian Hartman  Procedure(s) Performed: PARTIAL RIGHT AMPUTATION BELOW KNEE (Right: Knee) THORACIC AORTIC ENDOVASCULAR STENT GRAFT     Patient location during evaluation: SICU Anesthesia Type: General Level of consciousness: sedated Pain management: pain level controlled Vital Signs Assessment: post-procedure vital signs reviewed and stable Respiratory status: patient remains intubated per anesthesia plan Cardiovascular status: stable Postop Assessment: no apparent nausea or vomiting Anesthetic complications: no   No notable events documented.  Last Vitals:  Vitals:   12/17/20 0500 12/17/20 0600  BP: 96/61 (!) 91/55  Pulse:    Resp: 20 20  Temp: 37.9 C 37.9 C  SpO2:      Last Pain:  Vitals:   12/16/20 1631  TempSrc: Axillary  PainSc:                  Nelle Don Kelley Polinsky
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
## 2020-12-17 NOTE — Progress Notes (Signed)
Upon arrival, noticed BP on left arm was significantly lower than the right arm. Arterial line is on the right side as well. Notified MD. Orders to go by right arm BP. Will continue to monitor.

## 2020-12-17 NOTE — ED Notes (Signed)
Pt arrived at 1618- assessments charted prior to this time were completed on arrival time

## 2020-12-18 LAB — PHOSPHORUS
Phosphorus: 2.2 mg/dL — ABNORMAL LOW (ref 2.5–4.6)
Phosphorus: 2.7 mg/dL (ref 2.5–4.6)

## 2020-12-18 LAB — GLUCOSE, CAPILLARY
Glucose-Capillary: 97 mg/dL (ref 70–99)
Glucose-Capillary: 87 mg/dL (ref 70–99)
Glucose-Capillary: 90 mg/dL (ref 70–99)
Glucose-Capillary: 105 mg/dL — ABNORMAL HIGH (ref 70–99)

## 2020-12-18 LAB — CBC
HCT: 20.9 % — ABNORMAL LOW (ref 39.0–52.0)
Hemoglobin: 7 g/dL — ABNORMAL LOW (ref 13.0–17.0)
MCH: 30.6 pg (ref 26.0–34.0)
MCHC: 33.5 g/dL (ref 30.0–36.0)
MCV: 91.3 fL (ref 80.0–100.0)
Platelets: 89 10*3/uL — ABNORMAL LOW (ref 150–400)
RBC: 2.29 MIL/uL — ABNORMAL LOW (ref 4.22–5.81)
RDW: 18.4 % — ABNORMAL HIGH (ref 11.5–15.5)
WBC: 7.5 10*3/uL (ref 4.0–10.5)
nRBC: 0 % (ref 0.0–0.2)

## 2020-12-18 LAB — BASIC METABOLIC PANEL
Anion gap: 9 (ref 5–15)
BUN: 7 mg/dL (ref 6–20)
CO2: 21 mmol/L — ABNORMAL LOW (ref 22–32)
Calcium: 7.2 mg/dL — ABNORMAL LOW (ref 8.9–10.3)
Chloride: 109 mmol/L (ref 98–111)
Creatinine, Ser: 1.36 mg/dL — ABNORMAL HIGH (ref 0.61–1.24)
GFR, Estimated: >60 mL/min (ref >60)
Glucose, Bld: 99 mg/dL (ref 70–99)
Potassium: 3.3 mmol/L — ABNORMAL LOW (ref 3.5–5.1)
Sodium: 139 mmol/L (ref 135–145)

## 2020-12-18 LAB — MAGNESIUM
Magnesium: 1.4 mg/dL — ABNORMAL LOW (ref 1.7–2.4)
Magnesium: 1.7 mg/dL (ref 1.7–2.4)

## 2020-12-18 MED ORDER — DEXMEDETOMIDINE HCL IN NACL 400 MCG/100ML IV SOLN
0.2000 ug/kg/h | INTRAVENOUS | Status: DC
  Administered 2020-12-18: 0.2 ug/kg/h via INTRAVENOUS
  Administered 2020-12-18 – 2020-12-24 (×18): 0.7 ug/kg/h via INTRAVENOUS
  Filled 2020-12-18 (×19): qty 100

## 2020-12-18 MED ORDER — PROSOURCE TF PO LIQD
45.0000 mL | Freq: Two times a day (BID) | ORAL | Status: DC
  Administered 2020-12-18 – 2020-12-29 (×18): 45 mL
  Filled 2020-12-18 (×19): qty 45

## 2020-12-18 MED ORDER — ORAL CARE MOUTH RINSE
15.0000 mL | OROMUCOSAL | Status: DC
  Administered 2020-12-18 – 2020-12-27 (×93): 15 mL via OROMUCOSAL

## 2020-12-18 MED ORDER — PIVOT 1.5 CAL PO LIQD
1000.0000 mL | ORAL | Status: DC
  Administered 2020-12-19 – 2020-12-27 (×8): 1000 mL
  Filled 2020-12-18 (×3): qty 1000

## 2020-12-18 MED ORDER — POTASSIUM CHLORIDE 20 MEQ PO PACK
40.0000 meq | PACK | Freq: Once | ORAL | Status: AC
  Administered 2020-12-18: 40 meq
  Filled 2020-12-18: qty 2

## 2020-12-18 MED ORDER — CHLORHEXIDINE GLUCONATE 0.12% ORAL RINSE (MEDLINE KIT)
15.0000 mL | Freq: Two times a day (BID) | OROMUCOSAL | Status: DC
  Administered 2020-12-18 – 2020-12-27 (×20): 15 mL via OROMUCOSAL

## 2020-12-18 MED ORDER — VITAMIN E 6.75 MG/0.3ML PO SOLN
400.0000 [IU] | Freq: Three times a day (TID) | ORAL | Status: AC
  Administered 2020-12-18 – 2020-12-25 (×16): 400 [IU]
  Filled 2020-12-18 (×21): qty 8

## 2020-12-18 MED ORDER — ASCORBIC ACID 500 MG PO TABS
1000.0000 mg | ORAL_TABLET | Freq: Three times a day (TID) | ORAL | Status: AC
  Administered 2020-12-18 – 2020-12-25 (×16): 1000 mg
  Filled 2020-12-18 (×16): qty 2

## 2020-12-18 MED ORDER — VITAL HIGH PROTEIN PO LIQD: 1000.0000 mL | ORAL | Status: DC

## 2020-12-18 MED ORDER — SELENIUM 200 MCG PO TABS
200.0000 ug | ORAL_TABLET | Freq: Every day | ORAL | Status: AC
  Administered 2020-12-18 – 2020-12-24 (×6): 200 ug
  Filled 2020-12-18 (×7): qty 1
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
## 2020-12-18 NOTE — Progress Notes (Signed)
RT NOTES: PS/CPAP mode attempted, no patient effort. Placed back on full support at this time.
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
## 2020-12-19 ENCOUNTER — Inpatient Hospital Stay (HOSPITAL_COMMUNITY): Payer: Self-pay

## 2020-12-19 LAB — CBC
HCT: 17.9 % — ABNORMAL LOW (ref 39.0–52.0)
HCT: 25.4 % — ABNORMAL LOW (ref 39.0–52.0)
Hemoglobin: 5.8 g/dL — CL (ref 13.0–17.0)
Hemoglobin: 8.5 g/dL — ABNORMAL LOW (ref 13.0–17.0)
MCH: 30.9 pg (ref 26.0–34.0)
MCH: 30.8 pg (ref 26.0–34.0)
MCHC: 32.4 g/dL (ref 30.0–36.0)
MCHC: 33.5 g/dL (ref 30.0–36.0)
MCV: 95.2 fL (ref 80.0–100.0)
MCV: 92 fL (ref 80.0–100.0)
Platelets: 81 10*3/uL — ABNORMAL LOW (ref 150–400)
Platelets: 88 10*3/uL — ABNORMAL LOW (ref 150–400)
RBC: 1.88 MIL/uL — ABNORMAL LOW (ref 4.22–5.81)
RBC: 2.76 MIL/uL — ABNORMAL LOW (ref 4.22–5.81)
RDW: 17.4 % — ABNORMAL HIGH (ref 11.5–15.5)
RDW: 17.2 % — ABNORMAL HIGH (ref 11.5–15.5)
WBC: 7.7 10*3/uL (ref 4.0–10.5)
WBC: 9.5 10*3/uL (ref 4.0–10.5)
nRBC: 0 % (ref 0.0–0.2)
nRBC: 0 % (ref 0.0–0.2)

## 2020-12-19 LAB — PREPARE RBC (CROSSMATCH)

## 2020-12-19 LAB — GLUCOSE, CAPILLARY
Glucose-Capillary: 119 mg/dL — ABNORMAL HIGH (ref 70–99)
Glucose-Capillary: 120 mg/dL — ABNORMAL HIGH (ref 70–99)
Glucose-Capillary: 137 mg/dL — ABNORMAL HIGH (ref 70–99)
Glucose-Capillary: 132 mg/dL — ABNORMAL HIGH (ref 70–99)
Glucose-Capillary: 148 mg/dL — ABNORMAL HIGH (ref 70–99)

## 2020-12-19 LAB — BASIC METABOLIC PANEL
Anion gap: 9 (ref 5–15)
BUN: 8 mg/dL (ref 6–20)
CO2: 19 mmol/L — ABNORMAL LOW (ref 22–32)
Calcium: 7.2 mg/dL — ABNORMAL LOW (ref 8.9–10.3)
Chloride: 108 mmol/L (ref 98–111)
Creatinine, Ser: 1.15 mg/dL (ref 0.61–1.24)
GFR, Estimated: >60 mL/min (ref >60)
Glucose, Bld: 107 mg/dL — ABNORMAL HIGH (ref 70–99)
Potassium: 3.4 mmol/L — ABNORMAL LOW (ref 3.5–5.1)
Sodium: 136 mmol/L (ref 135–145)

## 2020-12-19 LAB — MAGNESIUM
Magnesium: 1.6 mg/dL — ABNORMAL LOW (ref 1.7–2.4)
Magnesium: 1.9 mg/dL (ref 1.7–2.4)

## 2020-12-19 LAB — PHOSPHORUS
Phosphorus: 2.4 mg/dL — ABNORMAL LOW (ref 2.5–4.6)
Phosphorus: 2.7 mg/dL (ref 2.5–4.6)

## 2020-12-19 MED ORDER — FUROSEMIDE 10 MG/ML IJ SOLN
40.0000 mg | Freq: Once | INTRAMUSCULAR | Status: AC
  Administered 2020-12-19: 40 mg via INTRAVENOUS
  Filled 2020-12-19: qty 4

## 2020-12-19 MED ORDER — IOHEXOL 300 MG/ML  SOLN
100.0000 mL | Freq: Once | INTRAMUSCULAR | Status: AC | PRN
  Administered 2020-12-19: 100 mL via INTRAVENOUS

## 2020-12-19 MED ORDER — SODIUM CHLORIDE 0.9% IV SOLUTION: Freq: Once | INTRAVENOUS | Status: DC

## 2020-12-19 MED ORDER — POTASSIUM CHLORIDE 10 MEQ/100ML IV SOLN
10.0000 meq | INTRAVENOUS | Status: AC
  Administered 2020-12-19 (×4): 10 meq via INTRAVENOUS
  Filled 2020-12-19 (×4): qty 100

## 2020-12-19 NOTE — Progress Notes (Signed)
Pt transported from 4N19 to CT3 and back on the ventilator. RT and RN x2 accompanied pt. VSS throughout. RT will continue to monitor and be available as needed.
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
## 2020-12-19 NOTE — Progress Notes (Signed)
Patient desat to 85-90% with a good waveform. I attempted to suction patient and give O2 breaths with no improvement. RT called at this time.

## 2020-12-19 NOTE — Progress Notes (Signed)
Patient increasing agitated and hitting side of bed and nursing staff. Increased propofol and fentanyl gtt at this time. VSS. Patient follows commands and arouses to voice. Will continue to monitor.   Sherral Hammers, RN

## 2020-12-19 NOTE — Progress Notes (Signed)
Patient began thrashing around in bed and trying to hit and kick nursing staff. Patient thrashing around to the point where he is disconnecting lines and the ventilator. 6 RN's and 1 NT to hold patient down. Sedation now on max and posey belt and LLE restraint added. Dr. Dwain Sarna notified at this time.   Sherral Hammers, RN

## 2020-12-19 NOTE — Progress Notes (Signed)
Came by to see this patient in rounds for Dr. Aundria Rud, but he was undergoing stat CT.  Spoke briefly with family member. Tentative plan will be for Dr. Lajoyce Corners to evaluate the patient this week for revision amputation.    Dennie Bible, PA-C Orthopedic [facility] [place] [phone]

## 2020-12-19 NOTE — Progress Notes (Signed)
Date and time results received: 12/19/20 0602 Test: Hgb Critical Value: 5.8  Name of Provider Notified: Dr. Dwain Sarna   Orders Received? Or Actions Taken?: Awaiting new orders to acknowledge

## 2020-12-19 NOTE — Progress Notes (Signed)
Patient became severely agitated and coughed/gagged up OG tube. Reinserted and air heard in stomach. Stat abdominal xray placed for verification. Tube feed and per tube meds held until verified. VSS.  Sherral Hammers RN

## 2020-12-19 NOTE — Progress Notes (Signed)
3 Days Post-Op   Subjective/Chief Complaint: Agitation, now sedated, decreased hb does not appear to be from amp site   Objective: Vital signs in last 24 hours: Temp:  [96.8 F (36 C)-101.12 F (38.4 C)] 97.5 F (36.4 C) (07/17 0700) Pulse Rate:  [80-132] 90 (07/17 0800) Resp:  [13-20] 13 (07/17 0800) BP: (99-160)/(45-80) 138/72 (07/17 0800) SpO2:  [91 %-100 %] 94 % (07/17 0800) Arterial Line BP: (75-154)/(30-72) 135/65 (07/17 0800) FiO2 (%):  [40 %] 40 % (07/17 0741) Last BM Date:  (PTA)  Intake/Output from previous day: 07/16 0701 - 07/17 0700 In: 3878.5 [I.V.:3628.5; NG/GT:250] Out: 1975 [Urine:1975] Intake/Output this shift: Total I/O In: 637.1 [I.V.:487.1; NG/GT:150] Out: 150 [Urine:150]   General: sedated Neuro: mae HEENT/Neck: ETT, [place] j in place Resp: on vent CV: rrr GI: soft, NT Extremities: 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  Anti-infectives: Anti-infectives (From admission, onward)    Start     Dose/Rate Route Frequency Ordered Stop   12/17/20 0600  ceFAZolin (ANCEF) IVPB 2g/100 mL premix  Status:  Discontinued        2 g 200 mL/hr over 30 Minutes Intravenous On call to O.R. 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 Aundria Rud 7/14, revision planned tomorrow Occult L PTX - CXR today due to decreased sta Acute hypoxic ventilator dependent respiratory failure - begin weaning, no real success in am, very agitated at times, will hold on extubated due to surgery tomorrow and concern for liver Aoritc transection with associated dissection flap - S/P TEVAR by Dr. Lenell Antu 7/14, asa eventually G3 liver laceration - serial CBC, will repeat ct a/p today due to decreased hb FEN - NPO, TF AKI - fluids, resolved, replace potassium today ABL anemia - prbcs today DVT - SCD only Dispo - ICU, OR again tomorrow, ct today Critical Care Total Time*: 32 Minutes  Emelia Loron 12/19/2020

## 2020-12-20 ENCOUNTER — Encounter (HOSPITAL_COMMUNITY): Payer: Self-pay | Admitting: Orthopedic Surgery

## 2020-12-20 ENCOUNTER — Inpatient Hospital Stay (HOSPITAL_COMMUNITY): Payer: Self-pay

## 2020-12-20 LAB — BPAM RBC
Blood Product Expiration Date: 202208142359
Blood Product Expiration Date: 202208142359
Blood Product Expiration Date: 202208142359
Blood Product Expiration Date: 202208142359
ISSUE DATE / TIME: 202207151014
ISSUE DATE / TIME: 202207151623
ISSUE DATE / TIME: 202207170636
ISSUE DATE / TIME: 202207170837
Unit Type and Rh: 5100
Unit Type and Rh: 5100
Unit Type and Rh: 5100
Unit Type and Rh: 5100

## 2020-12-20 LAB — TYPE AND SCREEN
ABO/RH(D): O NEG
Antibody Screen: NEGATIVE
Unit division: 0
Unit division: 0
Unit division: 0
Unit division: 0

## 2020-12-20 LAB — CBC
HCT: 23.7 % — ABNORMAL LOW (ref 39.0–52.0)
Hemoglobin: 7.8 g/dL — ABNORMAL LOW (ref 13.0–17.0)
MCH: 30.2 pg (ref 26.0–34.0)
MCHC: 32.9 g/dL (ref 30.0–36.0)
MCV: 91.9 fL (ref 80.0–100.0)
Platelets: 96 10*3/uL — ABNORMAL LOW (ref 150–400)
RBC: 2.58 MIL/uL — ABNORMAL LOW (ref 4.22–5.81)
RDW: 17.5 % — ABNORMAL HIGH (ref 11.5–15.5)
WBC: 8 10*3/uL (ref 4.0–10.5)
nRBC: 0.5 % — ABNORMAL HIGH (ref 0.0–0.2)

## 2020-12-20 LAB — GLUCOSE, CAPILLARY
Glucose-Capillary: 122 mg/dL — ABNORMAL HIGH (ref 70–99)
Glucose-Capillary: 103 mg/dL — ABNORMAL HIGH (ref 70–99)
Glucose-Capillary: 99 mg/dL (ref 70–99)
Glucose-Capillary: 104 mg/dL — ABNORMAL HIGH (ref 70–99)
Glucose-Capillary: 102 mg/dL — ABNORMAL HIGH (ref 70–99)
Glucose-Capillary: 96 mg/dL (ref 70–99)

## 2020-12-20 LAB — BASIC METABOLIC PANEL
Anion gap: 4 — ABNORMAL LOW (ref 5–15)
BUN: 8 mg/dL (ref 6–20)
CO2: 24 mmol/L (ref 22–32)
Calcium: 7.2 mg/dL — ABNORMAL LOW (ref 8.9–10.3)
Chloride: 111 mmol/L (ref 98–111)
Creatinine, Ser: 1.06 mg/dL (ref 0.61–1.24)
GFR, Estimated: >60 mL/min (ref >60)
Glucose, Bld: 113 mg/dL — ABNORMAL HIGH (ref 70–99)
Potassium: 3.2 mmol/L — ABNORMAL LOW (ref 3.5–5.1)
Sodium: 139 mmol/L (ref 135–145)

## 2020-12-20 LAB — TRIGLYCERIDES: Triglycerides: 130 mg/dL (ref <150)

## 2020-12-20 MED ORDER — DOCUSATE SODIUM 50 MG/5ML PO LIQD
100.0000 mg | Freq: Two times a day (BID) | ORAL | Status: DC
  Administered 2020-12-21 – 2020-12-29 (×12): 100 mg
  Filled 2020-12-20 (×13): qty 10

## 2020-12-20 MED ORDER — MAGNESIUM CITRATE PO SOLN
0.5000 | Freq: Once | ORAL | Status: AC
  Administered 2020-12-22: 0.5
  Filled 2020-12-20 (×3): qty 296

## 2020-12-20 MED ORDER — QUETIAPINE FUMARATE 200 MG PO TABS
200.0000 mg | ORAL_TABLET | Freq: Two times a day (BID) | ORAL | Status: DC
  Administered 2020-12-21 – 2020-12-29 (×16): 200 mg
  Filled 2020-12-20 (×16): qty 1

## 2020-12-20 MED ORDER — CLONAZEPAM 1 MG PO TABS: 1.0000 mg | ORAL_TABLET | Freq: Two times a day (BID) | ORAL | Status: DC

## 2020-12-20 MED ORDER — FUROSEMIDE 10 MG/ML IJ SOLN
20.0000 mg | Freq: Once | INTRAMUSCULAR | Status: AC
  Administered 2020-12-20: 20 mg via INTRAVENOUS
  Filled 2020-12-20: qty 2

## 2020-12-20 MED ORDER — POLYETHYLENE GLYCOL 3350 17 G PO PACK
17.0000 g | PACK | Freq: Once | ORAL | Status: AC
  Administered 2020-12-22: 17 g
  Filled 2020-12-20 (×3): qty 1

## 2020-12-20 MED ORDER — DOCUSATE SODIUM 100 MG PO CAPS: 100.0000 mg | ORAL_CAPSULE | Freq: Two times a day (BID) | ORAL | Status: DC

## 2020-12-20 MED ORDER — POTASSIUM CHLORIDE 10 MEQ/100ML IV SOLN
10.0000 meq | INTRAVENOUS | Status: AC
  Administered 2020-12-20 (×6): 10 meq via INTRAVENOUS
  Filled 2020-12-20 (×7): qty 100

## 2020-12-20 MED ORDER — MIDAZOLAM HCL 2 MG/2ML IJ SOLN
2.0000 mg | INTRAMUSCULAR | Status: DC | PRN
  Administered 2020-12-21 – 2020-12-24 (×8): 2 mg via INTRAVENOUS
  Filled 2020-12-20 (×8): qty 2

## 2020-12-20 NOTE — Progress Notes (Addendum)
After placement of cortrak, patient desat to 88% SpO2 while weaning on ventilator at 40%. I called RT and we placed patient back on full support at 50% and he is now 94%. Other VSS. Will continue to monitor.  1119 update: Patient still only 93% on 50% FiO2. Patient up to 60% FiO2 now on is 96% SpO2.   Sherral Hammers, RN

## 2020-12-20 NOTE — Plan of Care (Signed)
  Problem: Pain Managment: Goal: General experience of comfort will improve Outcome: Progressing   
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
## 2020-12-20 NOTE — Procedures (Signed)
Cortrak  Tube Type:  Cortrak - 43 inches Tube Location:  Left nare Initial Placement:  Stomach Secured by: Bridle Technique Used to Measure Tube Placement:  Marking at nare/corner of mouth Cortrak Secured At:  70 cm  Cortrak Tube Team Note:  Consult received to place a Cortrak feeding tube.   X-ray is required, abdominal x-ray has been ordered by the Cortrak team. Please confirm tube placement before using the Cortrak tube.   If the tube becomes dislodged please keep the tube and contact the Cortrak team at [email or web address] (password [email or web address]) for replacement.  If after hours and replacement cannot be delayed, place a NG tube and confirm placement with an abdominal x-ray.    Ned Kakar MS, RD, LDN Please refer to AMION for RD and/or RD on-call/weekend/after hours pager   
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
## 2020-12-21 ENCOUNTER — Other Ambulatory Visit: Payer: Self-pay | Admitting: Physician Assistant

## 2020-12-21 DIAGNOSIS — S88921A Partial traumatic amputation of right lower leg, level unspecified, initial encounter: Secondary | ICD-10-CM

## 2020-12-21 LAB — BASIC METABOLIC PANEL
Anion gap: 5 (ref 5–15)
BUN: 5 mg/dL — ABNORMAL LOW (ref 6–20)
CO2: 24 mmol/L (ref 22–32)
Calcium: 8.1 mg/dL — ABNORMAL LOW (ref 8.9–10.3)
Chloride: 112 mmol/L — ABNORMAL HIGH (ref 98–111)
Creatinine, Ser: 0.93 mg/dL (ref 0.61–1.24)
GFR, Estimated: >60 mL/min (ref >60)
Glucose, Bld: 102 mg/dL — ABNORMAL HIGH (ref 70–99)
Potassium: 3.7 mmol/L (ref 3.5–5.1)
Sodium: 141 mmol/L (ref 135–145)

## 2020-12-21 LAB — CBC
HCT: 23.5 % — ABNORMAL LOW (ref 39.0–52.0)
Hemoglobin: 7.5 g/dL — ABNORMAL LOW (ref 13.0–17.0)
MCH: 30 pg (ref 26.0–34.0)
MCHC: 31.9 g/dL (ref 30.0–36.0)
MCV: 94 fL (ref 80.0–100.0)
Platelets: 131 10*3/uL — ABNORMAL LOW (ref 150–400)
RBC: 2.5 MIL/uL — ABNORMAL LOW (ref 4.22–5.81)
RDW: 17.2 % — ABNORMAL HIGH (ref 11.5–15.5)
WBC: 6.7 10*3/uL (ref 4.0–10.5)
nRBC: 0 % (ref 0.0–0.2)

## 2020-12-21 LAB — GLUCOSE, CAPILLARY
Glucose-Capillary: 99 mg/dL (ref 70–99)
Glucose-Capillary: 87 mg/dL (ref 70–99)
Glucose-Capillary: 104 mg/dL — ABNORMAL HIGH (ref 70–99)
Glucose-Capillary: 101 mg/dL — ABNORMAL HIGH (ref 70–99)
Glucose-Capillary: 110 mg/dL — ABNORMAL HIGH (ref 70–99)
Glucose-Capillary: 98 mg/dL (ref 70–99)

## 2020-12-21 MED ORDER — FENTANYL CITRATE (PF) 2500 MCG/50ML IJ SOLN
0.0000 ug/h | Status: DC
  Administered 2020-12-21 – 2020-12-26 (×10): 400 ug/h via INTRAVENOUS
  Filled 2020-12-21 (×18): qty 100

## 2020-12-21 MED ORDER — FUROSEMIDE 10 MG/ML IJ SOLN
20.0000 mg | Freq: Once | INTRAMUSCULAR | Status: AC
  Administered 2020-12-21: 20 mg via INTRAVENOUS
  Filled 2020-12-21: qty 2

## 2020-12-21 MED ORDER — POTASSIUM CHLORIDE 20 MEQ PO PACK
40.0000 meq | PACK | Freq: Once | ORAL | Status: AC
  Administered 2020-12-21: 40 meq
  Filled 2020-12-21: qty 2

## 2020-12-21 MED ORDER — CLONAZEPAM 1 MG PO TABS
2.0000 mg | ORAL_TABLET | Freq: Two times a day (BID) | ORAL | Status: DC
  Administered 2020-12-21 – 2020-12-29 (×16): 2 mg
  Filled 2020-12-21 (×16): qty 2
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
## 2020-12-21 NOTE — Progress Notes (Signed)
Wasted 50 cc fentanyl with Beverly Gust, RN at this time.   Sherral Hammers RN

## 2020-12-21 NOTE — Progress Notes (Signed)
Patient ID: Christian Hartman, male   DOB: 1990/10/18, 30 y.o.   MRN: [id number] Follow up - Trauma Critical Care  Patient Details:    Christian Hartman is an 30 y.o. male.  Lines/tubes : Airway 7.5 mm (Active)  Secured at (cm) 27 cm 12/21/20 0309  Measured From Lips 12/21/20 0309  Secured Location Center 12/21/20 0309  Secured By [employer] 12/21/20 0309  Tube Holder Repositioned Yes 12/21/20 0309  Prone position No 12/21/20 0309  Head position Right 12/18/20 0335  Cuff Pressure (cm H2O) Clear OR 27-39 CmH2O 12/20/20 2000  Site Condition Dry 12/21/20 0309     Arterial Line 12/16/20 Left Radial (Active)  Site Assessment Clean;Dry;Intact 12/20/20 2000  Line Status Pulsatile blood flow;Positional 12/20/20 2000  Art Line Waveform Appropriate;Square wave test performed 12/20/20 2000  Art Line Interventions Zeroed and calibrated 12/20/20 2000  Color/Movement/Sensation Capillary refill less than 3 sec 12/20/20 2000  Dressing Type Transparent 12/20/20 2000  Dressing Change Due 12/23/20 12/20/20 2000     Urethral Catheter Verlon Au RN  Temperature probe 16 Fr. (Active)  Indication for Insertion or Continuance of Catheter Unstable spinal/crush injuries / Multisystem Trauma 12/20/20 2000  Site Assessment Clean;Intact 12/20/20 2000  Catheter Maintenance Bag below level of bladder;Catheter secured;Drainage bag/tubing not touching floor;Insertion date on drainage bag;No dependent loops;Seal intact 12/20/20 2000  Collection Container Standard drainage bag 12/20/20 2000  Securement Method Securing device (Describe) 12/20/20 2000  Urinary Catheter Interventions (if applicable) Unclamped 12/20/20 0800  Output (mL) 400 mL 12/21/20 0600    Microbiology/Sepsis markers: Results for orders placed or performed during the hospital encounter of 12/16/20  Resp Panel by RT-PCR (Flu A&B, Covid) Nasopharyngeal Swab     Status: None   Collection Time: 12/16/20  6:10 PM   Specimen: Nasopharyngeal Swab;  Nasopharyngeal(NP) swabs in vial transport medium  Result Value Ref Range Status   SARS Coronavirus 2 by RT PCR NEGATIVE NEGATIVE Final    Comment: (NOTE) SARS-CoV-2 target nucleic acids are NOT DETECTED.  The SARS-CoV-2 RNA is generally detectable in upper respiratory specimens during the acute phase of infection. The lowest concentration of SARS-CoV-2 viral copies this assay can detect is 138 copies/mL. A negative result does not preclude SARS-Cov-2 infection and should not be used as the sole basis for treatment or other patient management decisions. A negative result may occur with  improper specimen collection/handling, submission of specimen other than nasopharyngeal swab, presence of viral mutation(s) within the areas targeted by this assay, and inadequate number of viral copies(<138 copies/mL). A negative result must be combined with clinical observations, patient history, and epidemiological information. The expected result is Negative.  Fact Sheet for Patients:  BloggerCourse.com  Fact Sheet for Healthcare Providers:  SeriousBroker.it  This test is no t yet approved or cleared by the [place] FDA and  has been authorized for detection and/or diagnosis of SARS-CoV-2 by FDA under an Emergency Use Authorization (EUA). This EUA will remain  in effect (meaning this test can be used) for the duration of the COVID-19 declaration under Section 564(b)(1) of the Act, 21 U.S.C.section 360bbb-3(b)(1), unless the authorization is terminated  or revoked sooner.       Influenza A by PCR NEGATIVE NEGATIVE Final   Influenza B by PCR NEGATIVE NEGATIVE Final    Comment: (NOTE) The Xpert Xpress SARS-CoV-2/FLU/RSV plus assay is intended as an aid in the diagnosis of influenza from Nasopharyngeal swab specimens and should not be used as a sole basis for treatment. Nasal washings and  aspirates are unacceptable for Xpert Xpress  SARS-CoV-2/FLU/RSV testing.  Fact Sheet for Patients: BloggerCourse.comhttps://www.fda.gov/media/152166/download  Fact Sheet for Healthcare Providers: SeriousBroker.ithttps://www.fda.gov/media/152162/download  This test is not yet approved or cleared by the [place] FDA and has been authorized for detection and/or diagnosis of SARS-CoV-2 by FDA under an Emergency Use Authorization (EUA). This EUA will remain in effect (meaning this test can be used) for the duration of the COVID-19 declaration under Section 564(b)(1) of the Act, 21 U.S.C. section 360bbb-3(b)(1), unless the authorization is terminated or revoked.  Performed at [facility], [address] [place], [place] [address]   MRSA Next Gen by PCR, Nasal     Status: None   Collection Time: 12/16/20  8:30 PM   Specimen: Nasal Mucosa; Nasal Swab  Result Value Ref Range Status   MRSA by PCR Next Gen NOT DETECTED NOT DETECTED Final    Comment: (NOTE) The GeneXpert MRSA Assay (FDA approved for NASAL specimens only), is one component of a comprehensive MRSA colonization surveillance program. It is not intended to diagnose MRSA infection nor to guide or monitor treatment for MRSA infections. Test performance is not FDA approved in patients less than 29 years old. Performed at [facility], [address] [place], [place] [address]     Anti-infectives:  Anti-infectives (From admission, onward)    Start     Dose/Rate Route Frequency Ordered Stop   12/17/20 0600  ceFAZolin (ANCEF) IVPB 2g/100 mL premix  Status:  Discontinued        2 g 200 mL/hr over 30 Minutes Intravenous On call to O.R. 12/16/20 2040 12/16/20 2047   12/16/20 2200  ceFAZolin (ANCEF) IVPB 2g/100 mL premix        2 g 200 mL/hr over 30 Minutes Intravenous Every 8 hours 12/16/20 2043 12/17/20 0626   12/16/20 1700  piperacillin-tazobactam (ZOSYN) IVPB 3.375 g        3.375 g 100 mL/hr over 30 Minutes Intravenous  Once 12/16/20 1656 12/16/20 1723   12/16/20 1645  ceFAZolin  (ANCEF) IVPB 2g/100 mL premix  Status:  Discontinued        2 g 200 mL/hr over 30 Minutes Intravenous  Once 12/16/20 1637 12/16/20 1656     Consults: Treatment Team:  Yolonda Kidaogers, Jason Patrick, MD Leonie DouglasHawken, Thomas N, MD Myrene GalasHandy, Michael, MD Nadara Mustarduda, Marcus V, MD    Studies:    Events:  Subjective:    Overnight Issues:   Objective:  Vital signs for last 24 hours: Temp:  [97.52 F (36.4 C)-100.94 F (38.3 C)] 99.14 F (37.3 C) (07/19 0700) Pulse Rate:  [77-105] 96 (07/19 0700) Resp:  [0-21] 21 (07/19 0700) BP: (118-152)/(58-79) 148/76 (07/19 0700) SpO2:  [88 %-100 %] 93 % (07/19 0700) Arterial Line BP: (83-181)/(40-77) 178/65 (07/19 0700) FiO2 (%):  [30 %-60 %] 30 % (07/19 0309) Weight:  [73 kg] 73 kg (07/19 0500)  Hemodynamic parameters for last 24 hours:    Intake/Output from previous day: 07/18 0701 - 07/19 0700 In: 4606.9 [I.V.:4001.4; IV Piggyback:605.5] Out: 6125 [Urine:6125]  Intake/Output this shift: No intake/output data recorded.  Vent settings for last 24 hours: Vent Mode: PRVC FiO2 (%):  [30 %-60 %] 30 % Set Rate:  [20 bmp] 20 bmp Vt Set:  [580 mL] 580 mL PEEP:  [5 cmH20] 5 cmH20 Plateau Pressure:  [18 cmH20-23 cmH20] 18 cmH20  Physical Exam:  General: on vent Neuro: sedated HEENT/Neck: ETT Resp: few rhonchi CVS: RRR GI: soft, NT Extremities: R BKA dressed  Results for orders placed or performed during the hospital encounter of 12/16/20 (from the past 24 hour(s))  Glucose, capillary     Status: Abnormal   Collection Time: 12/20/20  7:42 AM  Result Value Ref Range   Glucose-Capillary 103 (H) 70 - 99 mg/dL   Comment 1 Notify RN    Comment 2 Document in Chart   Glucose, capillary     Status: None   Collection Time: 12/20/20 11:58 AM  Result Value Ref Range   Glucose-Capillary 99 70 - 99 mg/dL   Comment 1 Notify RN    Comment 2 Document in Chart   Glucose, capillary     Status: Abnormal   Collection Time: 12/20/20  3:26 PM  Result Value Ref  Range   Glucose-Capillary 104 (H) 70 - 99 mg/dL   Comment 1 Notify RN    Comment 2 Document in Chart   Glucose, capillary     Status: Abnormal   Collection Time: 12/20/20  7:24 PM  Result Value Ref Range   Glucose-Capillary 102 (H) 70 - 99 mg/dL  Glucose, capillary     Status: None   Collection Time: 12/20/20 11:25 PM  Result Value Ref Range   Glucose-Capillary 96 70 - 99 mg/dL  Glucose, capillary     Status: None   Collection Time: 12/21/20  3:34 AM  Result Value Ref Range   Glucose-Capillary 99 70 - 99 mg/dL  CBC     Status: Abnormal   Collection Time: 12/21/20  4:42 AM  Result Value Ref Range   WBC 6.7 4.0 - 10.5 K/uL   RBC 2.50 (L) 4.22 - 5.81 MIL/uL   Hemoglobin 7.5 (L) 13.0 - 17.0 g/dL   HCT 99.2 (L) 42.6 - 83.4 %   MCV 94.0 80.0 - 100.0 fL   MCH 30.0 26.0 - 34.0 pg   MCHC 31.9 30.0 - 36.0 g/dL   RDW 19.6 (H) 22.2 - 97.9 %   Platelets 131 (L) 150 - 400 K/uL   nRBC 0.0 0.0 - 0.2 %  Basic metabolic panel     Status: Abnormal   Collection Time: 12/21/20  4:42 AM  Result Value Ref Range   Sodium 141 135 - 145 mmol/L   Potassium 3.7 3.5 - 5.1 mmol/L   Chloride 112 (H) 98 - 111 mmol/L   CO2 24 22 - 32 mmol/L   Glucose, Bld 102 (H) 70 - 99 mg/dL   BUN 5 (L) 6 - 20 mg/dL   Creatinine, Ser 8.92 0.61 - 1.24 mg/dL   Calcium 8.1 (L) 8.9 - 10.3 mg/dL   GFR, Estimated >11 >94 mL/min   Anion gap 5 5 - 15    Assessment & Plan: Present on Admission: **None**    LOS: 5 days   Additional comments:I reviewed the patient's new clinical lab test results. . 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 Lajoyce Corners Occult L PTX - resolved Bilateral pleural effusions - gentle diuresis again today Acute hypoxic ventilator dependent respiratory failure - cont weaning, very agitated at times Aoritc transection with associated dissection flap - S/P TEVAR by Dr. Lenell Antu 7/14, ASA81 prior to discharge G3 liver laceration - repeat CT  A/P 7/17 due to drop in hgb, slight increase in hemoperitoneum but no active source of bleeding--presumed liver source FEN - NPO, resume TF AKI - resolved ABL anemia - txf 7/17 with appropriate response, F/U CT as above DVT -  SCD only for now given hgb drop Dispo - ICU, OR tomorrow Critical Care Total Time*: 34 Minutes  Violeta Gelinas, MD, MPH, FACS Trauma & General Surgery Use [email or web address] to contact on call provider  12/21/2020  *Care during the described time interval was provided by me. I have reviewed this patient's available data, including medical history, events of note, physical examination and test results as part of my evaluation.
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
## 2020-12-22 ENCOUNTER — Inpatient Hospital Stay (HOSPITAL_COMMUNITY): Payer: Self-pay

## 2020-12-22 ENCOUNTER — Encounter (HOSPITAL_COMMUNITY): Admission: EM | Discharge status: Against Medical Advice | Payer: Self-pay | Source: Home / Self Care

## 2020-12-22 HISTORY — PX: APPLICATION OF WOUND VAC: SHX5189

## 2020-12-22 HISTORY — PX: AMPUTATION: SHX166

## 2020-12-22 LAB — CBC
HCT: 24.6 % — ABNORMAL LOW (ref 39.0–52.0)
Hemoglobin: 8.1 g/dL — ABNORMAL LOW (ref 13.0–17.0)
MCH: 30.7 pg (ref 26.0–34.0)
MCHC: 32.9 g/dL (ref 30.0–36.0)
MCV: 93.2 fL (ref 80.0–100.0)
Platelets: 206 10*3/uL (ref 150–400)
RBC: 2.64 MIL/uL — ABNORMAL LOW (ref 4.22–5.81)
RDW: 16.8 % — ABNORMAL HIGH (ref 11.5–15.5)
WBC: 8.1 10*3/uL (ref 4.0–10.5)
nRBC: 0.5 % — ABNORMAL HIGH (ref 0.0–0.2)

## 2020-12-22 LAB — POCT I-STAT, CHEM 8
BUN: 10 mg/dL (ref 6–20)
Calcium, Ion: 1.18 mmol/L (ref 1.15–1.40)
Chloride: 103 mmol/L (ref 98–111)
Creatinine, Ser: 0.9 mg/dL (ref 0.61–1.24)
Glucose, Bld: 137 mg/dL — ABNORMAL HIGH (ref 70–99)
HCT: 24 % — ABNORMAL LOW (ref 39.0–52.0)
Hemoglobin: 8.2 g/dL — ABNORMAL LOW (ref 13.0–17.0)
Potassium: 4.1 mmol/L (ref 3.5–5.1)
Sodium: 137 mmol/L (ref 135–145)
TCO2: 26 mmol/L (ref 22–32)

## 2020-12-22 LAB — BASIC METABOLIC PANEL
Anion gap: 7 (ref 5–15)
BUN: 9 mg/dL (ref 6–20)
CO2: 25 mmol/L (ref 22–32)
Calcium: 8.4 mg/dL — ABNORMAL LOW (ref 8.9–10.3)
Chloride: 107 mmol/L (ref 98–111)
Creatinine, Ser: 0.98 mg/dL (ref 0.61–1.24)
GFR, Estimated: >60 mL/min (ref >60)
Glucose, Bld: 116 mg/dL — ABNORMAL HIGH (ref 70–99)
Potassium: 3.7 mmol/L (ref 3.5–5.1)
Sodium: 139 mmol/L (ref 135–145)

## 2020-12-22 LAB — GLUCOSE, CAPILLARY
Glucose-Capillary: 130 mg/dL — ABNORMAL HIGH (ref 70–99)
Glucose-Capillary: 106 mg/dL — ABNORMAL HIGH (ref 70–99)
Glucose-Capillary: 129 mg/dL — ABNORMAL HIGH (ref 70–99)
Glucose-Capillary: 123 mg/dL — ABNORMAL HIGH (ref 70–99)
Glucose-Capillary: 98 mg/dL (ref 70–99)

## 2020-12-22 SURGERY — AMPUTATION BELOW KNEE
Anesthesia: General | Site: Leg Lower | Laterality: Right

## 2020-12-22 MED ORDER — OXYCODONE HCL 5 MG/5ML PO SOLN
5.0000 mg | ORAL | Status: DC | PRN
  Filled 2020-12-22: qty 10

## 2020-12-22 MED ORDER — SUGAMMADEX SODIUM 200 MG/2ML IV SOLN
INTRAVENOUS | Status: DC | PRN
  Administered 2020-12-22: 200 mg via INTRAVENOUS

## 2020-12-22 MED ORDER — HYDROMORPHONE HCL 1 MG/ML IJ SOLN
INTRAMUSCULAR | Status: AC
  Filled 2020-12-22: qty 1

## 2020-12-22 MED ORDER — ONDANSETRON HCL 4 MG/2ML IJ SOLN
INTRAMUSCULAR | Status: DC | PRN
  Administered 2020-12-22: 4 mg via INTRAVENOUS

## 2020-12-22 MED ORDER — HYDROMORPHONE HCL 1 MG/ML IJ SOLN
INTRAMUSCULAR | Status: DC | PRN
  Administered 2020-12-22 (×2): 1 mg via INTRAVENOUS

## 2020-12-22 MED ORDER — MIDAZOLAM HCL 2 MG/2ML IJ SOLN
INTRAMUSCULAR | Status: AC
  Filled 2020-12-22: qty 2

## 2020-12-22 MED ORDER — KETAMINE HCL 10 MG/ML IJ SOLN
INTRAMUSCULAR | Status: DC | PRN
  Administered 2020-12-22: 30 mg via INTRAVENOUS
  Administered 2020-12-22: 20 mg via INTRAVENOUS

## 2020-12-22 MED ORDER — KETAMINE HCL 50 MG/5ML IJ SOSY
PREFILLED_SYRINGE | INTRAMUSCULAR | Status: AC
  Filled 2020-12-22: qty 5

## 2020-12-22 MED ORDER — ACETAMINOPHEN 10 MG/ML IV SOLN
INTRAVENOUS | Status: AC
  Filled 2020-12-22: qty 100

## 2020-12-22 MED ORDER — BUPIVACAINE LIPOSOME 1.3 % IJ SUSP
INTRAMUSCULAR | Status: DC | PRN
  Administered 2020-12-22: 10 mL via PERINEURAL

## 2020-12-22 MED ORDER — PHENYLEPHRINE HCL-NACL 10-0.9 MG/250ML-% IV SOLN
INTRAVENOUS | Status: DC | PRN
  Administered 2020-12-22: 40 ug/min via INTRAVENOUS

## 2020-12-22 MED ORDER — SODIUM CHLORIDE 0.9 % IV SOLN: INTRAVENOUS | Status: DC

## 2020-12-22 MED ORDER — DEXAMETHASONE SODIUM PHOSPHATE 10 MG/ML IJ SOLN
INTRAMUSCULAR | Status: DC | PRN
  Administered 2020-12-22: 5 mg via INTRAVENOUS

## 2020-12-22 MED ORDER — CEFAZOLIN SODIUM-DEXTROSE 2-3 GM-%(50ML) IV SOLR
INTRAVENOUS | Status: DC | PRN
  Administered 2020-12-22: 2 g via INTRAVENOUS

## 2020-12-22 MED ORDER — BUPIVACAINE HCL (PF) 0.5 % IJ SOLN
INTRAMUSCULAR | Status: DC | PRN
  Administered 2020-12-22: 30 mL via PERINEURAL

## 2020-12-22 MED ORDER — SODIUM CHLORIDE 0.9% IV SOLUTION: Freq: Once | INTRAVENOUS | Status: DC

## 2020-12-22 MED ORDER — ALBUMIN HUMAN 5 % IV SOLN: INTRAVENOUS | Status: DC | PRN

## 2020-12-22 MED ORDER — 0.9 % SODIUM CHLORIDE (POUR BTL) OPTIME
TOPICAL | Status: DC | PRN
  Administered 2020-12-22: 1000 mL

## 2020-12-22 MED ORDER — CEFAZOLIN SODIUM 1 G IJ SOLR
INTRAMUSCULAR | Status: AC
  Filled 2020-12-22: qty 20

## 2020-12-22 MED ORDER — CEFAZOLIN SODIUM-DEXTROSE 2-4 GM/100ML-% IV SOLN
2.0000 g | Freq: Three times a day (TID) | INTRAVENOUS | Status: AC
  Administered 2020-12-22 – 2020-12-23 (×2): 2 g via INTRAVENOUS
  Filled 2020-12-22 (×3): qty 100

## 2020-12-22 MED ORDER — ROCURONIUM BROMIDE 10 MG/ML (PF) SYRINGE
PREFILLED_SYRINGE | INTRAVENOUS | Status: DC | PRN
  Administered 2020-12-22 (×2): 50 mg via INTRAVENOUS

## 2020-12-22 MED ORDER — ACETAMINOPHEN 10 MG/ML IV SOLN
INTRAVENOUS | Status: DC | PRN
  Administered 2020-12-22: 1000 mg via INTRAVENOUS

## 2020-12-22 MED ORDER — LACTATED RINGERS IV SOLN: INTRAVENOUS | Status: DC | PRN

## 2020-12-22 MED ORDER — OXYCODONE HCL 5 MG/5ML PO SOLN
5.0000 mg | ORAL | Status: DC | PRN
  Administered 2020-12-22 – 2020-12-23 (×4): 10 mg
  Administered 2020-12-24: 5 mg
  Administered 2020-12-24 – 2020-12-25 (×3): 10 mg
  Filled 2020-12-22 (×3): qty 10
  Filled 2020-12-22: qty 5
  Filled 2020-12-22 (×5): qty 10

## 2020-12-22 MED ORDER — MIDAZOLAM HCL 5 MG/5ML IJ SOLN
INTRAMUSCULAR | Status: DC | PRN
  Administered 2020-12-22: 2 mg via INTRAVENOUS

## 2020-12-22 SURGICAL SUPPLY — 37 items
BAG COUNTER SPONGE SURGICOUNT (BAG) ×1 IMPLANT
BAG SPNG CNTER NS LX DISP (BAG) ×2
BLADE SAW RECIP 87.9 MT (BLADE) ×3 IMPLANT
BLADE SURG 21 STRL SS (BLADE) ×3 IMPLANT
BNDG COHESIVE 6X5 TAN STRL LF (GAUZE/BANDAGES/DRESSINGS) IMPLANT
CANISTER WOUND CARE 500ML ATS (WOUND CARE) ×3 IMPLANT
COVER SURGICAL LIGHT HANDLE (MISCELLANEOUS) ×3 IMPLANT
CUFF TOURN SGL QUICK 34 (TOURNIQUET CUFF) ×3
CUFF TRNQT CYL 34X4.125X (TOURNIQUET CUFF) ×2 IMPLANT
DRAPE DERMATAC (DRAPES) ×2 IMPLANT
DRAPE INCISE IOBAN 66X45 STRL (DRAPES) ×3 IMPLANT
DRAPE U-SHAPE 47X51 STRL (DRAPES) ×3 IMPLANT
DRESSING PREVENA PLUS CUSTOM (GAUZE/BANDAGES/DRESSINGS) ×2 IMPLANT
DRSG PREVENA PLUS CUSTOM (GAUZE/BANDAGES/DRESSINGS) ×3
DURAPREP 26ML APPLICATOR (WOUND CARE) ×3 IMPLANT
ELECT REM PT RETURN 9FT ADLT (ELECTROSURGICAL) ×3
ELECTRODE REM PT RTRN 9FT ADLT (ELECTROSURGICAL) ×2 IMPLANT
GLOVE SURG ORTHO LTX SZ9 (GLOVE) ×3 IMPLANT
GLOVE SURG UNDER POLY LF SZ9 (GLOVE) ×3 IMPLANT
GOWN STRL REUS W/ TWL XL LVL3 (GOWN DISPOSABLE) ×4 IMPLANT
GOWN STRL REUS W/TWL XL LVL3 (GOWN DISPOSABLE) ×6
KIT BASIN OR (CUSTOM PROCEDURE TRAY) ×3 IMPLANT
KIT TURNOVER KIT B (KITS) ×3 IMPLANT
MANIFOLD NEPTUNE II (INSTRUMENTS) ×2 IMPLANT
NS IRRIG 1000ML POUR BTL (IV SOLUTION) ×3 IMPLANT
PACK ORTHO EXTREMITY (CUSTOM PROCEDURE TRAY) ×3 IMPLANT
PAD ARMBOARD 7.5X6 YLW CONV (MISCELLANEOUS) ×3 IMPLANT
PREVENA RESTOR ARTHOFORM 46X30 (CANNISTER) ×3 IMPLANT
STAPLER VISISTAT 35W (STAPLE) IMPLANT
STOCKINETTE IMPERVIOUS LG (DRAPES) ×3 IMPLANT
SUT ETHILON 2 0 PSLX (SUTURE) IMPLANT
SUT SILK 2 0 (SUTURE) ×3
SUT SILK 2-0 18XBRD TIE 12 (SUTURE) ×2 IMPLANT
SUT VIC AB 1 CTX 27 (SUTURE) ×8 IMPLANT
TOWEL GREEN STERILE (TOWEL DISPOSABLE) ×3 IMPLANT
TUBE CONNECTING 12X1/4 (SUCTIONS) ×3 IMPLANT
YANKAUER SUCT BULB TIP NO VENT (SUCTIONS) ×3 IMPLANT

## 2020-12-22 NOTE — Transfer of Care (Signed)
Immediate Anesthesia Transfer of Care Note  Patient: Thayer Headings  Procedure(s) Performed: RIGHT BELOW KNEE AMPUTATION (Right: Knee) APPLICATION OF WOUND VAC (Right: Leg Lower)  Patient Location: ICU  Anesthesia Type:General  Level of Consciousness: sedated and Patient remains intubated per anesthesia plan  Airway & Oxygen Therapy: Patient remains intubated per anesthesia plan and Patient placed on Ventilator (see vital sign flow sheet for setting)  Post-op Assessment: Report given to RN and Post -op Vital signs reviewed and stable  Post vital signs: Reviewed and stable  Last Vitals:  Vitals Value Taken Time  BP 138/54 12/22/20 1230  Temp 37.5 C 12/22/20 1237  Pulse 113 12/22/20 1237  Resp 20 12/22/20 1237  SpO2 91 % 12/22/20 1237  Vitals shown include unvalidated device data.  Last Pain:  Vitals:   12/22/20 0800  TempSrc: Bladder  PainSc:          Complications: No notable events documented.
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
## 2020-12-22 NOTE — Interval H&P Note (Signed)
History and Physical Interval Note:  12/22/2020 6:50 AM  Christian Hartman  has presented today for surgery, with the diagnosis of Traumatic Amputation Right Leg.  The various methods of treatment have been discussed with the patient and family. After consideration of risks, benefits and other options for treatment, the patient has consented to  Procedure(s): RIGHT BELOW KNEE AMPUTATION (Right) as a surgical intervention.  The patient's history has been reviewed, patient examined, no change in status, stable for surgery.  I have reviewed the patient's chart and labs.  Questions were answered to the patient's satisfaction.     Nadara Mustard

## 2020-12-22 NOTE — Anesthesia Procedure Notes (Signed)
Anesthesia Regional Block: Femoral nerve block   Pre-Anesthetic Checklist: , timeout performed,  Correct Patient, Correct Site, Correct Laterality,  Correct Procedure, Correct Position, site marked,  Risks and benefits discussed,  Surgical consent,  Pre-op evaluation,  At surgeon's request and post-op pain management  Laterality: Right  Prep: Maximum Sterile Barrier Precautions used, chloraprep       Needles:  Injection technique: Single-shot  Needle Type: Echogenic Stimulator Needle     Needle Length: 9cm  Needle Gauge: 22     Additional Needles:   Procedures:,,,, ultrasound used (permanent image in chart),,    Narrative:  Start time: 12/22/2020 11:55 AM End time: 12/22/2020 12:00 PM Injection made incrementally with aspirations every 5 mL.  Performed by: Personally  Anesthesiologist: Lannie Fields, DO  Additional Notes: Monitors applied. No increased pain on injection. No increased resistance to injection. Injection made in 5cc increments. Good needle visualization. Patient tolerated procedure well.

## 2020-12-22 NOTE — Progress Notes (Signed)
Orthopedic Tech Progress Note Patient Details:  Marshel Golubski Jan 04, 1991 [id number] Called in order to [facility] for a VIVE PROTOCOL BK   Patient ID: Davidmichael Zarazua, male   DOB: Oct 24, 1990, 30 y.o.   MRN: [id number]  Donald Pore 12/22/2020, 1:25 PM
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
## 2020-12-22 NOTE — Anesthesia Postprocedure Evaluation (Signed)
Anesthesia Post Note  Patient: Thayer Headings  Procedure(s) Performed: RIGHT BELOW KNEE AMPUTATION (Right: Knee) APPLICATION OF WOUND VAC (Right: Leg Lower)     Patient location during evaluation: ICU Anesthesia Type: General and Regional Level of consciousness: sedated Pain management: pain level controlled Vital Signs Assessment: post-procedure vital signs reviewed and stable Respiratory status: patient remains intubated per anesthesia plan Cardiovascular status: stable Postop Assessment: no apparent nausea or vomiting Anesthetic complications: no   No notable events documented.  Last Vitals:  Vitals:   12/22/20 1100 12/22/20 1230  BP:  (!) 138/54  Pulse: (!) 111 (!) 111  Resp: (!) 23 20  Temp: (!) 38 C   SpO2: 93% 92%    Last Pain:  Vitals:   12/22/20 0800  TempSrc: Bladder  PainSc:                  Lannie Fields

## 2020-12-22 NOTE — Anesthesia Procedure Notes (Signed)
Anesthesia Regional Block: Popliteal block   Pre-Anesthetic Checklist: , timeout performed,  Correct Patient, Correct Site, Correct Laterality,  Correct Procedure, Correct Position, site marked,  Risks and benefits discussed,  Surgical consent,  Pre-op evaluation,  At surgeon's request and post-op pain management  Laterality: Right  Prep: Maximum Sterile Barrier Precautions used, chloraprep       Needles:  Injection technique: Single-shot  Needle Type: Echogenic Stimulator Needle     Needle Length: 9cm  Needle Gauge: 22     Additional Needles:   Procedures:,,,, ultrasound used (permanent image in chart),,    Narrative:  Start time: 12/22/2020 11:50 AM End time: 12/22/2020 11:55 AM Injection made incrementally with aspirations every 5 mL.  Performed by: Personally  Anesthesiologist: Lannie Fields, DO  Additional Notes: Monitors applied. No increased pain on injection. No increased resistance to injection. Injection made in 5cc increments. Good needle visualization. Patient tolerated procedure well.
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
## 2020-12-22 NOTE — Progress Notes (Signed)
Inpatient Rehab Admissions Coordinator:   Consult received for post-op amputation protocol.  Note pt still on the vent.  I will d/c order and follow for potential readiness through trauma rounds.   Estill Dooms, PT, DPT Admissions Coordinator [phone] 12/22/20  1:26 PM

## 2020-12-23 ENCOUNTER — Encounter (HOSPITAL_COMMUNITY): Payer: Self-pay | Admitting: Orthopedic Surgery

## 2020-12-23 ENCOUNTER — Inpatient Hospital Stay (HOSPITAL_COMMUNITY): Payer: Self-pay

## 2020-12-23 LAB — CBC
HCT: 20.9 % — ABNORMAL LOW (ref 39.0–52.0)
Hemoglobin: 6.6 g/dL — CL (ref 13.0–17.0)
MCH: 29.9 pg (ref 26.0–34.0)
MCHC: 31.6 g/dL (ref 30.0–36.0)
MCV: 94.6 fL (ref 80.0–100.0)
Platelets: 282 10*3/uL (ref 150–400)
RBC: 2.21 MIL/uL — ABNORMAL LOW (ref 4.22–5.81)
RDW: 16.9 % — ABNORMAL HIGH (ref 11.5–15.5)
WBC: 10.4 10*3/uL (ref 4.0–10.5)
nRBC: 0.8 % — ABNORMAL HIGH (ref 0.0–0.2)

## 2020-12-23 LAB — BASIC METABOLIC PANEL
Anion gap: 6 (ref 5–15)
BUN: 12 mg/dL (ref 6–20)
CO2: 25 mmol/L (ref 22–32)
Calcium: 8.1 mg/dL — ABNORMAL LOW (ref 8.9–10.3)
Chloride: 108 mmol/L (ref 98–111)
Creatinine, Ser: 1 mg/dL (ref 0.61–1.24)
GFR, Estimated: >60 mL/min (ref >60)
Glucose, Bld: 112 mg/dL — ABNORMAL HIGH (ref 70–99)
Potassium: 3.8 mmol/L (ref 3.5–5.1)
Sodium: 139 mmol/L (ref 135–145)

## 2020-12-23 LAB — GLUCOSE, CAPILLARY
Glucose-Capillary: 111 mg/dL — ABNORMAL HIGH (ref 70–99)
Glucose-Capillary: 140 mg/dL — ABNORMAL HIGH (ref 70–99)
Glucose-Capillary: 138 mg/dL — ABNORMAL HIGH (ref 70–99)
Glucose-Capillary: 130 mg/dL — ABNORMAL HIGH (ref 70–99)
Glucose-Capillary: 122 mg/dL — ABNORMAL HIGH (ref 70–99)
Glucose-Capillary: 113 mg/dL — ABNORMAL HIGH (ref 70–99)

## 2020-12-23 LAB — HEMOGLOBIN AND HEMATOCRIT, BLOOD
HCT: 23.2 % — ABNORMAL LOW (ref 39.0–52.0)
Hemoglobin: 7.5 g/dL — ABNORMAL LOW (ref 13.0–17.0)

## 2020-12-23 LAB — TRIGLYCERIDES: Triglycerides: 180 mg/dL — ABNORMAL HIGH (ref <150)

## 2020-12-23 LAB — PREPARE RBC (CROSSMATCH)

## 2020-12-23 MED ORDER — FUROSEMIDE 10 MG/ML IJ SOLN
40.0000 mg | Freq: Once | INTRAMUSCULAR | Status: AC
  Administered 2020-12-23: 40 mg via INTRAVENOUS
  Filled 2020-12-23: qty 4

## 2020-12-23 MED ORDER — SODIUM CHLORIDE 0.9% IV SOLUTION: Freq: Once | INTRAVENOUS | Status: AC
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
## 2020-12-23 NOTE — Progress Notes (Signed)
PT Cancellation Note  Patient Details Name: Christian Hartman MRN: [id number] DOB: 03-24-1991   Cancelled Treatment:    Reason Eval/Treat Not Completed: Patient not medically ready. RN reporting pt did not tolerate weaning on vent this morning and is currently on 100% FiO2 and sedated, requesting hold on PT this date. Will plan to follow-up another day as appropriate.   Raymond Gurney, PT, DPT Acute Rehabilitation Services  Pager: [phone] Office: [phone]    Jewel Baize 12/23/2020, 8:30 AM

## 2020-12-23 NOTE — Progress Notes (Signed)
Patient ID: Christian Hartman, male   DOB: 1990/10/18, 30 y.o.   MRN: [id number] Follow up - Trauma Critical Care  Patient Details:    Christian Hartman is an 30 y.o. male.  Lines/tubes : Airway 7.5 mm (Active)  Secured at (cm) 27 cm 12/21/20 0309  Measured From Lips 12/21/20 0309  Secured Location Center 12/21/20 0309  Secured By [employer] 12/21/20 0309  Tube Holder Repositioned Yes 12/21/20 0309  Prone position No 12/21/20 0309  Head position Right 12/18/20 0335  Cuff Pressure (cm H2O) Clear OR 27-39 CmH2O 12/20/20 2000  Site Condition Dry 12/21/20 0309     Arterial Line 12/16/20 Left Radial (Active)  Site Assessment Clean;Dry;Intact 12/20/20 2000  Line Status Pulsatile blood flow;Positional 12/20/20 2000  Art Line Waveform Appropriate;Square wave test performed 12/20/20 2000  Art Line Interventions Zeroed and calibrated 12/20/20 2000  Color/Movement/Sensation Capillary refill less than 3 sec 12/20/20 2000  Dressing Type Transparent 12/20/20 2000  Dressing Change Due 12/23/20 12/20/20 2000     Urethral Catheter Verlon Au RN  Temperature probe 16 Fr. (Active)  Indication for Insertion or Continuance of Catheter Unstable spinal/crush injuries / Multisystem Trauma 12/20/20 2000  Site Assessment Clean;Intact 12/20/20 2000  Catheter Maintenance Bag below level of bladder;Catheter secured;Drainage bag/tubing not touching floor;Insertion date on drainage bag;No dependent loops;Seal intact 12/20/20 2000  Collection Container Standard drainage bag 12/20/20 2000  Securement Method Securing device (Describe) 12/20/20 2000  Urinary Catheter Interventions (if applicable) Unclamped 12/20/20 0800  Output (mL) 400 mL 12/21/20 0600    Microbiology/Sepsis markers: Results for orders placed or performed during the hospital encounter of 12/16/20  Resp Panel by RT-PCR (Flu A&B, Covid) Nasopharyngeal Swab     Status: None   Collection Time: 12/16/20  6:10 PM   Specimen: Nasopharyngeal Swab;  Nasopharyngeal(NP) swabs in vial transport medium  Result Value Ref Range Status   SARS Coronavirus 2 by RT PCR NEGATIVE NEGATIVE Final    Comment: (NOTE) SARS-CoV-2 target nucleic acids are NOT DETECTED.  The SARS-CoV-2 RNA is generally detectable in upper respiratory specimens during the acute phase of infection. The lowest concentration of SARS-CoV-2 viral copies this assay can detect is 138 copies/mL. A negative result does not preclude SARS-Cov-2 infection and should not be used as the sole basis for treatment or other patient management decisions. A negative result may occur with  improper specimen collection/handling, submission of specimen other than nasopharyngeal swab, presence of viral mutation(s) within the areas targeted by this assay, and inadequate number of viral copies(<138 copies/mL). A negative result must be combined with clinical observations, patient history, and epidemiological information. The expected result is Negative.  Fact Sheet for Patients:  BloggerCourse.com  Fact Sheet for Healthcare Providers:  SeriousBroker.it  This test is no t yet approved or cleared by the [place] FDA and  has been authorized for detection and/or diagnosis of SARS-CoV-2 by FDA under an Emergency Use Authorization (EUA). This EUA will remain  in effect (meaning this test can be used) for the duration of the COVID-19 declaration under Section 564(b)(1) of the Act, 21 U.S.C.section 360bbb-3(b)(1), unless the authorization is terminated  or revoked sooner.       Influenza A by PCR NEGATIVE NEGATIVE Final   Influenza B by PCR NEGATIVE NEGATIVE Final    Comment: (NOTE) The Xpert Xpress SARS-CoV-2/FLU/RSV plus assay is intended as an aid in the diagnosis of influenza from Nasopharyngeal swab specimens and should not be used as a sole basis for treatment. Nasal washings and  aspirates are unacceptable for Xpert Xpress  SARS-CoV-2/FLU/RSV testing.  Fact Sheet for Patients: BloggerCourse.com  Fact Sheet for Healthcare Providers: SeriousBroker.it  This test is not yet approved or cleared by the [place] FDA and has been authorized for detection and/or diagnosis of SARS-CoV-2 by FDA under an Emergency Use Authorization (EUA). This EUA will remain in effect (meaning this test can be used) for the duration of the COVID-19 declaration under Section 564(b)(1) of the Act, 21 U.S.C. section 360bbb-3(b)(1), unless the authorization is terminated or revoked.  Performed at [facility], [address] [place], [place] [address]   MRSA Next Gen by PCR, Nasal     Status: None   Collection Time: 12/16/20  8:30 PM   Specimen: Nasal Mucosa; Nasal Swab  Result Value Ref Range Status   MRSA by PCR Next Gen NOT DETECTED NOT DETECTED Final    Comment: (NOTE) The GeneXpert MRSA Assay (FDA approved for NASAL specimens only), is one component of a comprehensive MRSA colonization surveillance program. It is not intended to diagnose MRSA infection nor to guide or monitor treatment for MRSA infections. Test performance is not FDA approved in patients less than 29 years old. Performed at [facility], [address] [place], [place] [address]     Anti-infectives:  Anti-infectives (From admission, onward)    Start     Dose/Rate Route Frequency Ordered Stop   12/22/20 1930  ceFAZolin (ANCEF) IVPB 2g/100 mL premix        2 g 200 mL/hr over 30 Minutes Intravenous Every 8 hours 12/22/20 1321 12/23/20 0527   12/17/20 0600  ceFAZolin (ANCEF) IVPB 2g/100 mL premix  Status:  Discontinued        2 g 200 mL/hr over 30 Minutes Intravenous On call to O.R. 12/16/20 2040 12/16/20 2047   12/16/20 2200  ceFAZolin (ANCEF) IVPB 2g/100 mL premix        2 g 200 mL/hr over 30 Minutes Intravenous Every 8 hours 12/16/20 2043 12/17/20 0626   12/16/20 1700   piperacillin-tazobactam (ZOSYN) IVPB 3.375 g        3.375 g 100 mL/hr over 30 Minutes Intravenous  Once 12/16/20 1656 12/16/20 1723   12/16/20 1645  ceFAZolin (ANCEF) IVPB 2g/100 mL premix  Status:  Discontinued        2 g 200 mL/hr over 30 Minutes Intravenous  Once 12/16/20 1637 12/16/20 1656     Consults: Treatment Team:  Leonie Douglas, MD Myrene Galas, MD Nadara Mustard, MD    Studies:    Events:  Subjective:    Overnight Issues: no acute changes, uneventful night  Objective:  Vital signs for last 24 hours: Temp:  [98.96 F (37.2 C)-101.3 F (38.5 C)] 101 F (38.3 C) (07/21 0600) Pulse Rate:  [96-123] 119 (07/21 0600) Resp:  [18-28] 21 (07/21 0600) BP: (117-165)/(51-79) 160/62 (07/21 0600) SpO2:  [80 %-100 %] 80 % (07/21 0749) Arterial Line BP: (85-188)/(45-110) 158/106 (07/21 0600) FiO2 (%):  [30 %-100 %] 100 % (07/21 0749) Weight:  [90.1 kg] 90.1 kg (07/21 0500)  Hemodynamic parameters for last 24 hours:    Intake/Output from previous day: 07/20 0701 - 07/21 0700 In: 4243.9 [I.V.:3090.7; NG/GT:353.3; IV Piggyback:799.9] Out: 3165 [Urine:2765; Blood:400]  Intake/Output this shift: No intake/output data recorded.  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 [place], [place] [address]   I-STAT, [place] 8     Status: Abnormal   Collection Time: 12/22/20 11:59 AM  Result Value Ref Range   Sodium 137 135 - 145 mmol/L   Potassium 4.1 3.5 - 5.1 mmol/L   Chloride 103 98 - 111 mmol/L   BUN 10 6 - 20 mg/dL   Creatinine, Ser 6.30 0.61 - 1.24 mg/dL   Glucose, Bld 160 (H) 70 - 99 mg/dL   Calcium, Ion 1.09 3.23 - 1.40 mmol/L   TCO2 26 22 - 32 mmol/L   Hemoglobin 8.2 (L) 13.0 - 17.0 g/dL   HCT 55.7 (L) 32.2 - 02.5 %  Glucose, capillary     Status: Abnormal   Collection Time: 12/22/20  3:49 PM  Result Value Ref Range   Glucose-Capillary 129 (H) 70 - 99 mg/dL   Comment 1 Notify RN    Comment 2 Document in Chart   Glucose, capillary     Status: Abnormal   Collection Time: 12/22/20  7:33 PM  Result Value Ref Range   Glucose-Capillary 123 (H) 70 - 99 mg/dL  Glucose, capillary     Status: None   Collection Time: 12/22/20 11:24 PM  Result Value Ref Range   Glucose-Capillary 98 70 - 99 mg/dL  Glucose, capillary     Status: Abnormal   Collection Time: 12/23/20  3:33 AM  Result Value Ref Range   Glucose-Capillary 111 (H) 70 - 99 mg/dL  CBC     Status: Abnormal   Collection Time: 12/23/20  5:00 AM  Result Value Ref Range   WBC 10.4 4.0 - 10.5 K/uL   RBC 2.21 (L) 4.22 - 5.81 MIL/uL   Hemoglobin 6.6 (LL) 13.0 - 17.0 g/dL   HCT 42.7 (L) 06.2 - 37.6 %   MCV 94.6 80.0 - 100.0 fL   MCH 29.9 26.0 - 34.0 pg   MCHC 31.6 30.0 - 36.0 g/dL   RDW 28.3 (H) 15.1 - 76.1 %   Platelets 282 150 - 400 K/uL   nRBC 0.8 (H) 0.0 - 0.2 %  Basic metabolic panel     Status: Abnormal   Collection Time: 12/23/20  5:00 AM  Result Value Ref Range   Sodium 139 135 - 145 mmol/L   Potassium 3.8 3.5 - 5.1 mmol/L   Chloride 108 98 - 111 mmol/L   CO2 25 22 - 32 mmol/L   Glucose, Bld 112 (H) 70 - 99 mg/dL   BUN 12 6 - 20 mg/dL   Creatinine, Ser 6.07 0.61 - 1.24 mg/dL   Calcium 8.1 (L) 8.9 - 10.3 mg/dL   GFR,  Estimated >37 >10 mL/min   Anion gap 6 5 - 15  Triglycerides     Status: Abnormal   Collection Time:  12/23/20  5:00 AM  Result Value Ref Range   Triglycerides 180 (H) <150 mg/dL  Glucose, capillary     Status: Abnormal   Collection Time: 12/23/20  7:29 AM  Result Value Ref Range   Glucose-Capillary 140 (H) 70 - 99 mg/dL  Prepare RBC (crossmatch)     Status: None   Collection Time: 12/23/20  7:53 AM  Result Value Ref Range   Order Confirmation      ORDER PROCESSED BY BLOOD BANK Performed at [facility], [address], [place], [place] [address]     Assessment & Plan: Present on Admission: **None**    LOS: 7 days   Additional comments:I reviewed the patient's new clinical lab test results. . 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 Aundria Rud 7/14, revision planned today (7/20) by Dr. Lajoyce Corners Occult L PTX - resolved Bilateral pleural effusions - diuresed last few days, no apparent respiratory compromise, repeat cxr shows improving right sided effusion but still apparent on x-ray. Stop IVF once tube feeds can be resumed postop Acute hypoxic ventilator dependent respiratory failure - attempts at weaning resulted in desaturation into 60s, tachycardia and severe agitation. Difficult due to agitation (remote tbi). Continue klonopin/seroquel. Added PO oxy 7/20. Effusions improving. Additional lasix today; repeat cxr in am. May ultimately need right sided effusion drained if further diuresis is not sufficient for weaning. Aortic transection with associated dissection flap - S/P TEVAR by Dr. Lenell Antu 7/14, ASA81 prior to discharge G3 liver laceration - repeat CT A/P 7/17 due to drop in hgb, slight increase in hemoperitoneum but no active source of bleeding--presumed liver source. 1U PRBC today for acute blood loss anemia, hgb drifted to 6.6. FEN - Continue tube feeds AKI - resolved ABL anemia - txf 7/17 with appropriate response, hgb stable DVT - SCD only for now  given hgb drop Dispo - ICU Critical Care Total Time*: 35 Minutes  Andria Meuse MD FACS Trauma & General Surgery Use [email or web address] to contact on call provider  12/23/2020  *Care during the described time interval was provided by me. I have reviewed this patient's available data, including medical history, events of note, physical examination and test results as part of my evaluation.

## 2020-12-23 NOTE — Progress Notes (Addendum)
POD 1 s/p Below knee Amputation  Intubated sedated. Limb protector in place . Wound vac is in place. 2 green checks 0 cc in cannister. When recovered most likely DC to CIR if an option.

## 2020-12-23 NOTE — Progress Notes (Signed)
OT Cancellation Note  Patient Details Name: Christian Hartman MRN: [id number] DOB: Dec 16, 1990   Cancelled Treatment:    Reason Eval/Treat Not Completed: Medical issues which prohibited therapy.  Pt on 100% Fi02 via Vent, and currently sedated.  Will reattempt.  Eber Jones., OTR/L Acute Rehabilitation Services Pager [phone] Office [phone]   Jeani Hawking M 12/23/2020, 8:55 AM

## 2020-12-23 NOTE — TOC Progression Note (Signed)
Transition of Care [facility]) - Progression Note    Patient Details  Name: Christian Hartman MRN: [id number] Date of Birth: 1990-08-22  Transition of Care [facility]) CM/SW Contact  Glennon Mac, RN Phone Number: 12/23/2020, 3:11 PM  Clinical Narrative:   Patient unable to wean from the ventilator today with sats in the 60s during weaning attempts.  Patient status post right amputation revision on 12/22/2020.  Discussed patient in multidisciplinary trauma rounds; CIR to follow at a distance for progress with therapies and medical readiness for rehab.   Expected Discharge Plan: IP Rehab Facility Barriers to Discharge: Continued Medical Work up  Expected Discharge Plan and Services Expected Discharge Plan: IP Rehab Facility   Discharge Planning Services: CM Consult   Living arrangements for the past 2 months: Single Family Home                                       Social Determinants of Health (SDOH) Interventions    Readmission Risk Interventions No flowsheet data found.  Quintella Baton, RN, BSN  Trauma/Neuro ICU Case Manager [phone]

## 2020-12-23 NOTE — Progress Notes (Signed)
Pt hemoglobin 6.6 this am. Notified Dr. Freida Busman. No orders at this time. Will continue to monitor

## 2020-12-24 ENCOUNTER — Inpatient Hospital Stay (HOSPITAL_COMMUNITY): Payer: Self-pay

## 2020-12-24 ENCOUNTER — Encounter (HOSPITAL_COMMUNITY): Payer: Self-pay | Admitting: Orthopedic Surgery

## 2020-12-24 LAB — CBC
HCT: 25 % — ABNORMAL LOW (ref 39.0–52.0)
HCT: 27.2 % — ABNORMAL LOW (ref 39.0–52.0)
Hemoglobin: 7.9 g/dL — ABNORMAL LOW (ref 13.0–17.0)
Hemoglobin: 8.6 g/dL — ABNORMAL LOW (ref 13.0–17.0)
MCH: 29.5 pg (ref 26.0–34.0)
MCH: 29.7 pg (ref 26.0–34.0)
MCHC: 31.6 g/dL (ref 30.0–36.0)
MCHC: 31.6 g/dL (ref 30.0–36.0)
MCV: 93.3 fL (ref 80.0–100.0)
MCV: 93.8 fL (ref 80.0–100.0)
Platelets: 324 10*3/uL (ref 150–400)
Platelets: 387 10*3/uL (ref 150–400)
RBC: 2.68 MIL/uL — ABNORMAL LOW (ref 4.22–5.81)
RBC: 2.9 MIL/uL — ABNORMAL LOW (ref 4.22–5.81)
RDW: 17.8 % — ABNORMAL HIGH (ref 11.5–15.5)
RDW: 17.8 % — ABNORMAL HIGH (ref 11.5–15.5)
WBC: 11.6 10*3/uL — ABNORMAL HIGH (ref 4.0–10.5)
WBC: 13.5 10*3/uL — ABNORMAL HIGH (ref 4.0–10.5)
nRBC: 0.8 % — ABNORMAL HIGH (ref 0.0–0.2)
nRBC: 1 % — ABNORMAL HIGH (ref 0.0–0.2)

## 2020-12-24 LAB — GLUCOSE, CAPILLARY
Glucose-Capillary: 103 mg/dL — ABNORMAL HIGH (ref 70–99)
Glucose-Capillary: 114 mg/dL — ABNORMAL HIGH (ref 70–99)
Glucose-Capillary: 123 mg/dL — ABNORMAL HIGH (ref 70–99)
Glucose-Capillary: 126 mg/dL — ABNORMAL HIGH (ref 70–99)
Glucose-Capillary: 126 mg/dL — ABNORMAL HIGH (ref 70–99)
Glucose-Capillary: 135 mg/dL — ABNORMAL HIGH (ref 70–99)

## 2020-12-24 LAB — COMPREHENSIVE METABOLIC PANEL
ALT: 85 U/L — ABNORMAL HIGH (ref 0–44)
AST: 140 U/L — ABNORMAL HIGH (ref 15–41)
Albumin: 2.6 g/dL — ABNORMAL LOW (ref 3.5–5.0)
Alkaline Phosphatase: 51 U/L (ref 38–126)
Anion gap: 9 (ref 5–15)
BUN: 19 mg/dL (ref 6–20)
CO2: 28 mmol/L (ref 22–32)
Calcium: 8.6 mg/dL — ABNORMAL LOW (ref 8.9–10.3)
Chloride: 102 mmol/L (ref 98–111)
Creatinine, Ser: 0.91 mg/dL (ref 0.61–1.24)
GFR, Estimated: >60 mL/min (ref >60)
Glucose, Bld: 135 mg/dL — ABNORMAL HIGH (ref 70–99)
Potassium: 3.4 mmol/L — ABNORMAL LOW (ref 3.5–5.1)
Sodium: 139 mmol/L (ref 135–145)
Total Bilirubin: 1 mg/dL (ref 0.3–1.2)
Total Protein: 6.1 g/dL — ABNORMAL LOW (ref 6.5–8.1)

## 2020-12-24 LAB — TYPE AND SCREEN
ABO/RH(D): O NEG
Antibody Screen: NEGATIVE
Unit division: 0

## 2020-12-24 LAB — BPAM RBC
Blood Product Expiration Date: 202207282359
ISSUE DATE / TIME: 202207210904
Unit Type and Rh: 9500

## 2020-12-24 LAB — SURGICAL PATHOLOGY

## 2020-12-24 LAB — BASIC METABOLIC PANEL
Anion gap: 7 (ref 5–15)
BUN: 17 mg/dL (ref 6–20)
CO2: 26 mmol/L (ref 22–32)
Calcium: 8.2 mg/dL — ABNORMAL LOW (ref 8.9–10.3)
Chloride: 104 mmol/L (ref 98–111)
Creatinine, Ser: 0.82 mg/dL (ref 0.61–1.24)
GFR, Estimated: >60 mL/min (ref >60)
Glucose, Bld: 116 mg/dL — ABNORMAL HIGH (ref 70–99)
Potassium: 3.8 mmol/L (ref 3.5–5.1)
Sodium: 137 mmol/L (ref 135–145)

## 2020-12-24 LAB — MAGNESIUM: Magnesium: 1.9 mg/dL (ref 1.7–2.4)

## 2020-12-24 LAB — PHOSPHORUS: Phosphorus: 3.3 mg/dL (ref 2.5–4.6)

## 2020-12-24 MED ORDER — FOLIC ACID 1 MG PO TABS
1.0000 mg | ORAL_TABLET | Freq: Every day | ORAL | Status: DC
  Filled 2020-12-24: qty 1

## 2020-12-24 MED ORDER — METOPROLOL TARTRATE 25 MG PO TABS
12.5000 mg | ORAL_TABLET | Freq: Two times a day (BID) | ORAL | Status: DC
  Administered 2020-12-24 – 2020-12-27 (×7): 12.5 mg
  Filled 2020-12-24 (×8): qty 1

## 2020-12-24 MED ORDER — KETAMINE BOLUS VIA INFUSION: 0.3500 mg/kg | INTRAVENOUS | Status: DC | PRN

## 2020-12-24 MED ORDER — THIAMINE HCL 100 MG/ML IJ SOLN: 100.0000 mg | Freq: Every day | INTRAMUSCULAR | Status: DC

## 2020-12-24 MED ORDER — ADULT MULTIVITAMIN W/MINERALS CH
1.0000 | ORAL_TABLET | Freq: Every day | ORAL | Status: DC
  Filled 2020-12-24: qty 1

## 2020-12-24 MED ORDER — SODIUM CHLORIDE 0.9 % IV SOLN
0.5000 mg/kg/h | INTRAVENOUS | Status: DC
  Administered 2020-12-24 – 2020-12-25 (×3): 0.5 mg/kg/h via INTRAVENOUS
  Filled 2020-12-24 (×5): qty 5

## 2020-12-24 MED ORDER — LORAZEPAM 1 MG PO TABS: 1.0000 mg | ORAL_TABLET | ORAL | Status: DC | PRN

## 2020-12-24 MED ORDER — KETAMINE BOLUS VIA INFUSION
0.3500 mg/kg | INTRAVENOUS | Status: DC | PRN
  Administered 2020-12-24 – 2020-12-25 (×2): 31.47 mg via INTRAVENOUS
  Filled 2020-12-24: qty 35

## 2020-12-24 MED ORDER — THIAMINE HCL 100 MG PO TABS
100.0000 mg | ORAL_TABLET | Freq: Every day | ORAL | Status: DC
  Filled 2020-12-24: qty 1

## 2020-12-24 MED ORDER — THIAMINE HCL 100 MG PO TABS
100.0000 mg | ORAL_TABLET | Freq: Every day | ORAL | Status: DC
  Administered 2020-12-25 – 2020-12-29 (×5): 100 mg
  Filled 2020-12-24 (×5): qty 1

## 2020-12-24 MED ORDER — METOPROLOL TARTRATE 5 MG/5ML IV SOLN
5.0000 mg | INTRAVENOUS | Status: DC | PRN
  Administered 2020-12-24: 5 mg via INTRAVENOUS
  Filled 2020-12-24 (×2): qty 5

## 2020-12-24 MED ORDER — LORAZEPAM 2 MG/ML IJ SOLN: 1.0000 mg | INTRAMUSCULAR | Status: DC | PRN

## 2020-12-24 MED ORDER — KETAMINE HCL 50 MG/5ML IJ SOSY: 0.3500 mg/kg | PREFILLED_SYRINGE | INTRAMUSCULAR | Status: DC | PRN

## 2020-12-24 MED ORDER — IOHEXOL 350 MG/ML SOLN
100.0000 mL | Freq: Once | INTRAVENOUS | Status: AC | PRN
  Administered 2020-12-24: 100 mL via INTRAVENOUS

## 2020-12-24 MED ORDER — ADULT MULTIVITAMIN LIQUID CH
15.0000 mL | Freq: Every day | ORAL | Status: DC
  Administered 2020-12-24 – 2020-12-29 (×6): 15 mL
  Filled 2020-12-24 (×7): qty 15

## 2020-12-24 MED ORDER — FUROSEMIDE 10 MG/ML IJ SOLN
60.0000 mg | Freq: Once | INTRAMUSCULAR | Status: AC
  Administered 2020-12-24: 60 mg via INTRAVENOUS
  Filled 2020-12-24: qty 6

## 2020-12-24 MED ORDER — DEXMEDETOMIDINE HCL IN NACL 400 MCG/100ML IV SOLN
0.2000 ug/kg/h | INTRAVENOUS | Status: DC
  Administered 2020-12-24: 1 ug/kg/h via INTRAVENOUS
  Administered 2020-12-24: 1.2 ug/kg/h via INTRAVENOUS
  Administered 2020-12-24: 1 ug/kg/h via INTRAVENOUS
  Administered 2020-12-25 – 2020-12-27 (×10): 1.2 ug/kg/h via INTRAVENOUS
  Administered 2020-12-27: 0.9 ug/kg/h via INTRAVENOUS
  Administered 2020-12-27: 1.1 ug/kg/h via INTRAVENOUS
  Administered 2020-12-27: 0.9 ug/kg/h via INTRAVENOUS
  Administered 2020-12-28: 0.6 ug/kg/h via INTRAVENOUS
  Administered 2020-12-28: 0.5 ug/kg/h via INTRAVENOUS
  Filled 2020-12-24 (×21): qty 100

## 2020-12-24 MED ORDER — ENOXAPARIN SODIUM 30 MG/0.3ML IJ SOSY
30.0000 mg | PREFILLED_SYRINGE | Freq: Two times a day (BID) | INTRAMUSCULAR | Status: DC
  Administered 2020-12-24 – 2020-12-30 (×13): 30 mg via SUBCUTANEOUS
  Filled 2020-12-24 (×13): qty 0.3

## 2020-12-24 MED ORDER — FOLIC ACID 1 MG PO TABS
1.0000 mg | ORAL_TABLET | Freq: Every day | ORAL | Status: DC
  Administered 2020-12-25 – 2020-12-29 (×5): 1 mg
  Filled 2020-12-24 (×5): qty 1

## 2020-12-24 MED ORDER — ACETAMINOPHEN 500 MG PO TABS: 1000.0000 mg | ORAL_TABLET | Freq: Four times a day (QID) | ORAL | Status: DC

## 2020-12-24 MED ORDER — METOPROLOL TARTRATE 25 MG PO TABS: 12.5000 mg | ORAL_TABLET | Freq: Two times a day (BID) | ORAL | Status: DC

## 2020-12-24 MED ORDER — NICOTINE 14 MG/24HR TD PT24
14.0000 mg | MEDICATED_PATCH | Freq: Every day | TRANSDERMAL | Status: DC
  Administered 2020-12-24 – 2020-12-30 (×7): 14 mg via TRANSDERMAL
  Filled 2020-12-24 (×7): qty 1

## 2020-12-24 MED ORDER — ACETAMINOPHEN 500 MG PO TABS
1000.0000 mg | ORAL_TABLET | Freq: Four times a day (QID) | ORAL | Status: DC
  Administered 2020-12-24 – 2020-12-30 (×19): 1000 mg
  Filled 2020-12-24 (×20): qty 2

## 2020-12-24 NOTE — Progress Notes (Signed)
Trauma/Critical Care Follow Up Note  Subjective:    Overnight Issues:   Objective:  Vital signs for last 24 hours: Temp:  [97.52 F (36.4 C)-100.94 F (38.3 C)] 100.94 F (38.3 C) (07/22 1200) Pulse Rate:  [81-121] 121 (07/22 1200) Resp:  [18-25] 20 (07/22 1200) BP: (105-196)/(44-81) 149/72 (07/22 1200) SpO2:  [84 %-100 %] 93 % (07/22 1200) Arterial Line BP: (71-208)/(44-112) 153/67 (07/22 1030) FiO2 (%):  [40 %-100 %] 40 % (07/22 0804) Weight:  [89.9 kg] 89.9 kg (07/22 0500)  Hemodynamic parameters for last 24 hours:    Intake/Output from previous day: 07/21 0701 - 07/22 0700 In: 5033.5 [I.V.:3418.5; Blood:315; NG/GT:1300] Out: 4750 [Urine:4750]  Intake/Output this shift: Total I/O In: 642.3 [I.V.:442.3; NG/GT:200] Out: 1600 [Urine:1600]  Vent settings for last 24 hours: Vent Mode: PRVC FiO2 (%):  [40 %-100 %] 40 % Set Rate:  [20 bmp] 20 bmp Vt Set:  [580 mL] 580 mL PEEP:  [5 cmH20] 5 cmH20 Pressure Support:  [5 cmH20] 5 cmH20 Plateau Pressure:  [13 cmH20-20 cmH20] 20 cmH20  Physical Exam:  Gen: comfortable, no distress Neuro: not interactive, recently medicated HEENT: PERRL Neck: supple CV: RRR Pulm: unlabored breathing Abd: soft, NT GU: clear yellow urine, foley  Extr: wwp, 1+ edema   Results for orders placed or performed during the hospital encounter of 12/16/20 (from the past 24 hour(s))  Hemoglobin and hematocrit, blood     Status: Abnormal   Collection Time: 12/23/20  3:12 PM  Result Value Ref Range   Hemoglobin 7.5 (L) 13.0 - 17.0 g/dL   HCT 17.7 (L) 93.9 - 03.0 %  Glucose, capillary     Status: Abnormal   Collection Time: 12/23/20  3:51 PM  Result Value Ref Range   Glucose-Capillary 130 (H) 70 - 99 mg/dL  Glucose, capillary     Status: Abnormal   Collection Time: 12/23/20  7:56 PM  Result Value Ref Range   Glucose-Capillary 122 (H) 70 - 99 mg/dL  Glucose, capillary     Status: Abnormal   Collection Time: 12/23/20 11:28 PM  Result  Value Ref Range   Glucose-Capillary 113 (H) 70 - 99 mg/dL  Glucose, capillary     Status: Abnormal   Collection Time: 12/24/20  4:19 AM  Result Value Ref Range   Glucose-Capillary 103 (H) 70 - 99 mg/dL  CBC     Status: Abnormal   Collection Time: 12/24/20  4:35 AM  Result Value Ref Range   WBC 11.6 (H) 4.0 - 10.5 K/uL   RBC 2.68 (L) 4.22 - 5.81 MIL/uL   Hemoglobin 7.9 (L) 13.0 - 17.0 g/dL   HCT 09.2 (L) 33.0 - 07.6 %   MCV 93.3 80.0 - 100.0 fL   MCH 29.5 26.0 - 34.0 pg   MCHC 31.6 30.0 - 36.0 g/dL   RDW 22.6 (H) 33.3 - 54.5 %   Platelets 324 150 - 400 K/uL   nRBC 0.8 (H) 0.0 - 0.2 %  Basic metabolic panel     Status: Abnormal   Collection Time: 12/24/20  4:35 AM  Result Value Ref Range   Sodium 137 135 - 145 mmol/L   Potassium 3.8 3.5 - 5.1 mmol/L   Chloride 104 98 - 111 mmol/L   CO2 26 22 - 32 mmol/L   Glucose, Bld 116 (H) 70 - 99 mg/dL   BUN 17 6 - 20 mg/dL   Creatinine, Ser 6.25 0.61 - 1.24 mg/dL   Calcium 8.2 (L) 8.9 - 10.3  mg/dL   GFR, Estimated >96 >04 mL/min   Anion gap 7 5 - 15  Glucose, capillary     Status: Abnormal   Collection Time: 12/24/20  7:49 AM  Result Value Ref Range   Glucose-Capillary 123 (H) 70 - 99 mg/dL  Glucose, capillary     Status: Abnormal   Collection Time: 12/24/20 11:52 AM  Result Value Ref Range   Glucose-Capillary 126 (H) 70 - 99 mg/dL    Assessment & Plan: The plan of care was discussed with the bedside nurse for the day, who is in agreement with this plan and no additional concerns were raised.   Present on Admission: **None**    LOS: 8 days   Additional comments:I reviewed the patient's new clinical lab test results.   and I reviewed the patients new imaging test results.    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 Aundria Rud 7/14, revision planned today (7/20) by Dr. Lajoyce Corners Occult L PTX - resolved Bilateral pleural effusions - diuresed last few days, no apparent respiratory compromise, repeat cxr shows  improving right sided effusion but still apparent on x-ray. Stop IVF once tube feeds can be resumed postop Acute hypoxic ventilator dependent respiratory failure - weaning attempts previously unsuccessful due to agitation leaving to profound desaturation episodes, meds adjusted today, continue to try. Delirium/agitation - continue klonopin/seroquel. On fent at 400, propofol at 70, precedex at 1, getting oxy PO and PRN versed. Adding ketamine today with the hopes of being able to lower propofol requirements. H/o tobacco abuse, add nicotine patch for withdrawal. Parents state no alcohol use at all, but girlfried reports nightly ingestion of 2-4 shots of vodka to aid in sleep, so will also start CIWA for possible alcohol withdrawal.  Aortic transection with associated dissection flap - S/P TEVAR by Dr. Lenell Antu 7/14, ASA81 prior to discharge G3 liver laceration - repeat CT A/P 7/17 due to drop in hgb, slight increase in hemoperitoneum but no active source of bleeding--presumed liver source. CTA 3x phase today to eval for hepatic PSA. FEN - Continue tube feeds ABL anemia - stable DVT - SCDs, start LMWH Foley - d/c today Dispo - ICU  Critical Care Total Time: 40 minutes  Diamantina Monks, MD Trauma & General Surgery Please use [email or web address] to contact on call provider  12/24/2020  *Care during the described time interval was provided by me. I have reviewed this patient's available data, including medical history, events of note, physical examination and test results as part of my evaluation.

## 2020-12-24 NOTE — Progress Notes (Signed)
Patient is postop day 2 status post below-knee amputation.  He is sedated and intubated.  Wound VAC is functioning with 1 green check.  0 cc in the canister limb protector is in place.   Will begin physical therapy once extubated.  Will need discharge to inpatient rehab or nursing.  Once he is discharged we will need a 1 week follow-up in our office.  Wound VAC will remain in place for a week or so

## 2020-12-24 NOTE — Progress Notes (Signed)
PT failed wean D/T increased B/P and HR and decreased SpO2

## 2020-12-24 NOTE — Progress Notes (Signed)
   12/24/20 0748  Airway 7.5 mm  Placement Date/Time: 12/16/20 1630   Grade View: Grade 1  Placed By: ED Physician  Airway Device: Endotracheal Tube  Laryngoscope Blade: MAC;4  ETT Types: Oral  Size (mm): 7.5 mm  Cuffed: Cuffed  Insertion attempts: 1  Airway Equipment: Stylet  Placem...  Secured at (cm) 27 cm  Measured From Lips  Secured Location Left  Secured By Actuary Repositioned Yes  Prone position No  Cuff Pressure (cm H2O) Green OR 18-26 CmH2O  Site Condition Dry  Adult Ventilator Settings  Vent Type Servo i  Humidity HME  Vent Mode PSV;CPAP  FiO2 (%) 40 %  Pressure Support 5 cmH20  PEEP 5 cmH20  Adult Ventilator Measurements  Peak Airway Pressure 11 L/min  Mean Airway Pressure 7 cmH20  Resp Rate Spontaneous 23 br/min  Resp Rate Total 23 br/min  Spont TV 574 mL  Measured Ve 12.4 mL  Auto PEEP 0 cmH20  Total PEEP 5 cmH20  SpO2 94 %  Adult Ventilator Alarms  Alarms On Y  Ve High Alarm 20 L/min  Ve Low Alarm 4 L/min  Resp Rate High Alarm 38 br/min  Resp Rate Low Alarm 8  PEEP Low Alarm 3 cmH2O  Press High Alarm 45 cmH2O  T Apnea 20 sec(s)  VAP Prevention  HME changed No  Ventilator changed No  Transported while on vent No  HOB> 30 Degrees Y  Equipment wiped down Yes  Daily Weaning Assessment  Daily Assessment of Readiness to Wean Wean protocol criteria met (SBT performed)  SBT Method CPAP 5 cm H20 and PS 5 cm H20  Weaning Start Time 0748  Breath Sounds  Bilateral Breath Sounds Diminished  Airway Suctioning/Secretions  Suction Type ETT  Suction Device  Catheter  Secretion Amount None  Suction Tolerance Tolerated well  Suctioning Adverse Effects None

## 2020-12-24 NOTE — Progress Notes (Signed)
PT Cancellation Note  Patient Details Name: Christian Hartman MRN: [id number] DOB: [birth year]   Cancelled Treatment:    Reason Eval/Treat Not Completed: Patient not medically ready;Patient's level of consciousness. RN requesting hold this date. Will follow-up another day as appropriate.   Raymond Gurney, PT, DPT Acute Rehabilitation Services  Pager: [phone] Office: [phone]    Jewel Baize 12/24/2020, 1:54 PM

## 2020-12-24 NOTE — Progress Notes (Signed)
OT Cancellation Note  Patient Details Name: Christian Hartman MRN: [id number] DOB: May 01, 1991   Cancelled Treatment:    Reason Eval/Treat Not Completed: Medical issues which prohibited therapy.  Pt currently sedated.   Eber Jones., OTR/L Acute Rehabilitation Services Pager [phone] Office [phone]   Jeani Hawking M 12/24/2020, 3:01 PM

## 2020-12-25 LAB — CBC
HCT: 27 % — ABNORMAL LOW (ref 39.0–52.0)
Hemoglobin: 8.3 g/dL — ABNORMAL LOW (ref 13.0–17.0)
MCH: 29.1 pg (ref 26.0–34.0)
MCHC: 30.7 g/dL (ref 30.0–36.0)
MCV: 94.7 fL (ref 80.0–100.0)
Platelets: 407 10*3/uL — ABNORMAL HIGH (ref 150–400)
RBC: 2.85 MIL/uL — ABNORMAL LOW (ref 4.22–5.81)
RDW: 17.5 % — ABNORMAL HIGH (ref 11.5–15.5)
WBC: 13.3 10*3/uL — ABNORMAL HIGH (ref 4.0–10.5)
nRBC: 0.5 % — ABNORMAL HIGH (ref 0.0–0.2)

## 2020-12-25 LAB — BASIC METABOLIC PANEL
Anion gap: 10 (ref 5–15)
BUN: 22 mg/dL — ABNORMAL HIGH (ref 6–20)
CO2: 27 mmol/L (ref 22–32)
Calcium: 9.1 mg/dL (ref 8.9–10.3)
Chloride: 100 mmol/L (ref 98–111)
Creatinine, Ser: 0.94 mg/dL (ref 0.61–1.24)
GFR, Estimated: >60 mL/min (ref >60)
Glucose, Bld: 123 mg/dL — ABNORMAL HIGH (ref 70–99)
Potassium: 3.8 mmol/L (ref 3.5–5.1)
Sodium: 137 mmol/L (ref 135–145)

## 2020-12-25 LAB — GLUCOSE, CAPILLARY
Glucose-Capillary: 123 mg/dL — ABNORMAL HIGH (ref 70–99)
Glucose-Capillary: 124 mg/dL — ABNORMAL HIGH (ref 70–99)
Glucose-Capillary: 152 mg/dL — ABNORMAL HIGH (ref 70–99)
Glucose-Capillary: 133 mg/dL — ABNORMAL HIGH (ref 70–99)
Glucose-Capillary: 119 mg/dL — ABNORMAL HIGH (ref 70–99)
Glucose-Capillary: 143 mg/dL — ABNORMAL HIGH (ref 70–99)

## 2020-12-25 MED ORDER — FUROSEMIDE 10 MG/ML IJ SOLN
40.0000 mg | Freq: Once | INTRAMUSCULAR | Status: AC
  Administered 2020-12-25: 40 mg via INTRAVENOUS
  Filled 2020-12-25: qty 4

## 2020-12-25 NOTE — Progress Notes (Signed)
OT Cancellation Note  Patient Details Name: Christian Hartman MRN: [id number] DOB: [birth year]   Cancelled Treatment:    Reason Eval/Treat Not Completed: Patient not medically ready (sedated intubated)  Wynona Neat, OTR/L  Acute Rehabilitation Services Pager: [phone] Office: [phone] .  12/25/2020, 11:09 AM

## 2020-12-25 NOTE — Progress Notes (Signed)
Patient continues to be difficult to extubate given his persistent agitation, he is unable to participate in a left upper extremity neuro exam.   Fortunately he has been moving his left upper extremity purposefully. I doubt that he has critical limb ischemia of the left upper extremity. Will check in early next week to confirm with a neurologic exam.  Christian Hartman. Lenell Antu, MD [facility] Phone Number: [phone] 12/25/2020 11:26 AM

## 2020-12-25 NOTE — Progress Notes (Signed)
Attempted to wean sedation to extubation.  Once propofol off, patient writhing in bed, won't follow commands, coughing and chewing on tube, not cooperative.  Decided to go back up on sedation and keep intubated.  May need to remove the tube under suboptimal circumstances due to sedation needs eventually but would I am not going to attempt that over the weekend.  Quentin Ore, MD

## 2020-12-25 NOTE — Progress Notes (Signed)
PT Cancellation Note  Patient Details Name: [name] MRN: [id number] DOB: [birth year]   Cancelled Treatment:    Reason Eval/Treat Not Completed: Patient not medically ready this morning as he required increased sedation during attempts to wean this AM. PT will continue to follow and evaluate as appropriate.   Lazarus Gowda, PT, DPT   Acute Rehabilitation Department Pager #: [phone]   Gaetana Michaelis 12/25/2020, 11:15 AM

## 2020-12-25 NOTE — Progress Notes (Signed)
   12/25/20 0715  Airway 7.5 mm  Placement Date/Time: 12/16/20 1630   Grade View: Grade 1  Placed By: ED Physician  Airway Device: Endotracheal Tube  Laryngoscope Blade: MAC;4  ETT Types: Oral  Size (mm): 7.5 mm  Cuffed: Cuffed  Insertion attempts: 1  Airway Equipment: Stylet  Placem...  Secured at (cm) 26 cm  Measured From Lips  Secured Location Left  Secured By Actuary Repositioned Yes  Prone position No  Cuff Pressure (cm H2O) Green OR 18-26 CmH2O  Site Condition Dry  Adult Ventilator Settings  Vent Type Servo i  Humidity HME  Vent Mode PSV;CPAP  FiO2 (%) 40 %  Pressure Support 8 cmH20  PEEP 5 cmH20  Adult Ventilator Measurements  Peak Airway Pressure 14 L/min  Mean Airway Pressure 8 cmH20  Resp Rate Spontaneous 23 br/min  Resp Rate Total 23 br/min  Spont TV 464 mL  Measured Ve 10.4 mL  Auto PEEP 0 cmH20  Total PEEP 5 cmH20  SpO2 98 %  Adult Ventilator Alarms  Alarms On Y  Ve High Alarm 20 L/min  Ve Low Alarm 4 L/min  Resp Rate High Alarm 38 br/min  Resp Rate Low Alarm 38  PEEP Low Alarm 3 cmH2O  Press High Alarm 45 cmH2O  T Apnea 20 sec(s)  Daily Weaning Assessment  Daily Assessment of Readiness to Wean Wean protocol criteria met (SBT performed)  SBT Method  (8/5)  Weaning Start Time 0800  Breath Sounds  Bilateral Breath Sounds Diminished  Airway Suctioning/Secretions  Suction Type ETT  Suction Device  Catheter  Secretion Amount None  Suction Tolerance Tolerated well  Suctioning Adverse Effects None

## 2020-12-25 NOTE — Progress Notes (Signed)
Trauma/Critical Care Follow Up Note  Subjective:    Overnight Issues:   Objective:  Vital signs for last 24 hours: Temp:  [97.8 F (36.6 C)-101.12 F (38.4 C)] 97.8 F (36.6 C) (07/23 0523) Pulse Rate:  [76-121] 97 (07/23 0715) Resp:  [16-25] 20 (07/23 0715) BP: (114-196)/(45-80) 121/60 (07/23 0700) SpO2:  [79 %-100 %] 98 % (07/23 0715) Arterial Line BP: (85-245)/(43-103) 114/49 (07/23 0630) FiO2 (%):  [40 %-100 %] 50 % (07/23 0335) Weight:  [88 kg] 88 kg (07/23 0500)  Hemodynamic parameters for last 24 hours:    Intake/Output from previous day: 07/22 0701 - 07/23 0700 In: 3423.1 [I.V.:1963.1; NG/GT:1460] Out: 5210 [Urine:5050; Emesis/NG output:160]  Intake/Output this shift: No intake/output data recorded.  Vent settings for last 24 hours: Vent Mode: PRVC FiO2 (%):  [40 %-100 %] 50 % Set Rate:  [20 bmp] 20 bmp Vt Set:  [580 mL] 580 mL PEEP:  [5 cmH20] 5 cmH20 Pressure Support:  [5 cmH20] 5 cmH20 Plateau Pressure:  [17 cmH20-20 cmH20] 17 cmH20  Physical Exam:  Gen: comfortable, no distress, sedated Neuro: not interactive, recently medicated HEENT: PERRL Neck: supple CV: RRR Pulm: unlabored breathing Abd: soft, NT GU: clear yellow urine, condom cath Extr: wwp, 1+ edema   Results for orders placed or performed during the hospital encounter of 12/16/20 (from the past 24 hour(s))  Glucose, capillary     Status: Abnormal   Collection Time: 12/24/20  7:49 AM  Result Value Ref Range   Glucose-Capillary 123 (H) 70 - 99 mg/dL  Glucose, capillary     Status: Abnormal   Collection Time: 12/24/20 11:52 AM  Result Value Ref Range   Glucose-Capillary 126 (H) 70 - 99 mg/dL  Comprehensive metabolic panel     Status: Abnormal   Collection Time: 12/24/20 12:59 PM  Result Value Ref Range   Sodium 139 135 - 145 mmol/L   Potassium 3.4 (L) 3.5 - 5.1 mmol/L   Chloride 102 98 - 111 mmol/L   CO2 28 22 - 32 mmol/L   Glucose, Bld 135 (H) 70 - 99 mg/dL   BUN 19 6 - 20  mg/dL   Creatinine, Ser 8.67 0.61 - 1.24 mg/dL   Calcium 8.6 (L) 8.9 - 10.3 mg/dL   Total Protein 6.1 (L) 6.5 - 8.1 g/dL   Albumin 2.6 (L) 3.5 - 5.0 g/dL   AST 672 (H) 15 - 41 U/L   ALT 85 (H) 0 - 44 U/L   Alkaline Phosphatase 51 38 - 126 U/L   Total Bilirubin 1.0 0.3 - 1.2 mg/dL   GFR, Estimated >09 >47 mL/min   Anion gap 9 5 - 15  Magnesium     Status: None   Collection Time: 12/24/20 12:59 PM  Result Value Ref Range   Magnesium 1.9 1.7 - 2.4 mg/dL  Phosphorus     Status: None   Collection Time: 12/24/20 12:59 PM  Result Value Ref Range   Phosphorus 3.3 2.5 - 4.6 mg/dL  CBC     Status: Abnormal   Collection Time: 12/24/20 12:59 PM  Result Value Ref Range   WBC 13.5 (H) 4.0 - 10.5 K/uL   RBC 2.90 (L) 4.22 - 5.81 MIL/uL   Hemoglobin 8.6 (L) 13.0 - 17.0 g/dL   HCT 09.6 (L) 28.3 - 66.2 %   MCV 93.8 80.0 - 100.0 fL   MCH 29.7 26.0 - 34.0 pg   MCHC 31.6 30.0 - 36.0 g/dL   RDW 94.7 (H) 65.4 -  15.5 %   Platelets 387 150 - 400 K/uL   nRBC 1.0 (H) 0.0 - 0.2 %  Glucose, capillary     Status: Abnormal   Collection Time: 12/24/20  3:28 PM  Result Value Ref Range   Glucose-Capillary 114 (H) 70 - 99 mg/dL  Glucose, capillary     Status: Abnormal   Collection Time: 12/24/20  7:54 PM  Result Value Ref Range   Glucose-Capillary 126 (H) 70 - 99 mg/dL   Comment 1 Notify RN    Comment 2 Document in Chart   Glucose, capillary     Status: Abnormal   Collection Time: 12/24/20 11:48 PM  Result Value Ref Range   Glucose-Capillary 135 (H) 70 - 99 mg/dL   Comment 1 Notify RN    Comment 2 Document in Chart   CBC     Status: Abnormal   Collection Time: 12/25/20  4:44 AM  Result Value Ref Range   WBC 13.3 (H) 4.0 - 10.5 K/uL   RBC 2.85 (L) 4.22 - 5.81 MIL/uL   Hemoglobin 8.3 (L) 13.0 - 17.0 g/dL   HCT 38.4 (L) 66.5 - 99.3 %   MCV 94.7 80.0 - 100.0 fL   MCH 29.1 26.0 - 34.0 pg   MCHC 30.7 30.0 - 36.0 g/dL   RDW 57.0 (H) 17.7 - 93.9 %   Platelets 407 (H) 150 - 400 K/uL   nRBC 0.5 (H) 0.0  - 0.2 %  Basic metabolic panel     Status: Abnormal   Collection Time: 12/25/20  4:44 AM  Result Value Ref Range   Sodium 137 135 - 145 mmol/L   Potassium 3.8 3.5 - 5.1 mmol/L   Chloride 100 98 - 111 mmol/L   CO2 27 22 - 32 mmol/L   Glucose, Bld 123 (H) 70 - 99 mg/dL   BUN 22 (H) 6 - 20 mg/dL   Creatinine, Ser 0.30 0.61 - 1.24 mg/dL   Calcium 9.1 8.9 - 09.2 mg/dL   GFR, Estimated >33 >00 mL/min   Anion gap 10 5 - 15  Glucose, capillary     Status: Abnormal   Collection Time: 12/25/20  5:21 AM  Result Value Ref Range   Glucose-Capillary 123 (H) 70 - 99 mg/dL   Comment 1 Notify RN    Comment 2 Document in Chart     Assessment & Plan: The plan of care was discussed with the bedside nurse for the day, who is in agreement with this plan and no additional concerns were raised.   Present on Admission: **None**    LOS: 9 days   Additional comments:I reviewed the patient's new clinical lab test results.   and I reviewed the patients new imaging test results.    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 Aundria Rud 7/14, revision to transtibial amputation 7/20 with Dr. Lajoyce Corners Occult L PTX - resolved Bilateral pleural effusions - diuresed last few days, no apparent respiratory compromise, repeat cxr shows improving right sided effusion but still apparent on x-ray. Stop IVF once tube feeds can be resumed postop.  Continue diuresis with 40 IV lasix today Acute hypoxic ventilator dependent respiratory failure - weaning attempts previously unsuccessful due to agitation leaving to profound desaturation episodes, meds adjusted today, continue to try. Just starting to wean sedation towards weaning trial for today Delirium/agitation - continue klonopin/seroquel. On fent at 400, propofol at 70, precedex at 1, getting oxy PO and PRN versed. Adding ketamine today  with the hopes of being able to lower propofol requirements. H/o tobacco abuse, add nicotine patch for withdrawal. Parents  state no alcohol use at all, but girlfried reports nightly ingestion of 2-4 shots of vodka to aid in sleep, so will also start CIWA for possible alcohol withdrawal.  Aortic transection with associated dissection flap - S/P TEVAR by Dr. Lenell Antu 7/14, ASA81 prior to discharge G3 liver laceration - repeat CT A/P 7/17 due to drop in hgb, slight increase in hemoperitoneum but no active source of bleeding--presumed liver source. CTA 3x phase today to eval for hepatic PSA. FEN - Continue tube feeds ABL anemia - stable today, monitor and transfuse if needed DVT - SCDs, start LMWH Foley - d/c today Dispo - ICU  Critical Care Total Time: 31 minutes  Quentin Ore, MD General Surgery Please use [email or web address] to contact on call provider  12/25/2020  *Care during the described time interval was provided by me. I have reviewed this patient's available data, including medical history, events of note, physical examination and test results as part of my evaluation.

## 2020-12-26 LAB — GLUCOSE, CAPILLARY
Glucose-Capillary: 134 mg/dL — ABNORMAL HIGH (ref 70–99)
Glucose-Capillary: 125 mg/dL — ABNORMAL HIGH (ref 70–99)
Glucose-Capillary: 133 mg/dL — ABNORMAL HIGH (ref 70–99)
Glucose-Capillary: 159 mg/dL — ABNORMAL HIGH (ref 70–99)
Glucose-Capillary: 108 mg/dL — ABNORMAL HIGH (ref 70–99)
Glucose-Capillary: 154 mg/dL — ABNORMAL HIGH (ref 70–99)

## 2020-12-26 LAB — TRIGLYCERIDES: Triglycerides: 200 mg/dL — ABNORMAL HIGH (ref <150)

## 2020-12-26 MED ORDER — OXYCODONE HCL 5 MG/5ML PO SOLN
10.0000 mg | Freq: Four times a day (QID) | ORAL | Status: DC
  Administered 2020-12-26 – 2020-12-27 (×4): 10 mg
  Filled 2020-12-26 (×3): qty 10

## 2020-12-26 MED ORDER — FUROSEMIDE 10 MG/ML IJ SOLN
40.0000 mg | Freq: Two times a day (BID) | INTRAMUSCULAR | Status: AC
  Administered 2020-12-26 (×2): 40 mg via INTRAVENOUS
  Filled 2020-12-26 (×2): qty 4

## 2020-12-26 MED ORDER — OXYCODONE HCL 5 MG/5ML PO SOLN: 10.0000 mg | Freq: Four times a day (QID) | ORAL | Status: DC

## 2020-12-26 NOTE — Progress Notes (Signed)
Follow up - Trauma and Critical Care  Patient Details:    Christian Hartman is an 30 y.o. male.  Anti-infectives:  Anti-infectives (From admission, onward)    Start     Dose/Rate Route Frequency Ordered Stop   12/22/20 1930  ceFAZolin (ANCEF) IVPB 2g/100 mL premix        2 g 200 mL/hr over 30 Minutes Intravenous Every 8 hours 12/22/20 1321 12/23/20 0527   12/17/20 0600  ceFAZolin (ANCEF) IVPB 2g/100 mL premix  Status:  Discontinued        2 g 200 mL/hr over 30 Minutes Intravenous On call to O.R. 12/16/20 2040 12/16/20 2047   12/16/20 2200  ceFAZolin (ANCEF) IVPB 2g/100 mL premix        2 g 200 mL/hr over 30 Minutes Intravenous Every 8 hours 12/16/20 2043 12/17/20 0626   12/16/20 1700  piperacillin-tazobactam (ZOSYN) IVPB 3.375 g        3.375 g 100 mL/hr over 30 Minutes Intravenous  Once 12/16/20 1656 12/16/20 1723   12/16/20 1645  ceFAZolin (ANCEF) IVPB 2g/100 mL premix  Status:  Discontinued        2 g 200 mL/hr over 30 Minutes Intravenous  Once 12/16/20 1637 12/16/20 1656       Consults: Treatment Team:  Leonie Douglas, MD Myrene Galas, MD Nadara Mustard, MD   Chief Complaint/Subjective:    Overnight Issues: Calmer, decreased sedation medications yesterday  Objective:  Vital signs for last 24 hours: Temp:  [98.4 F (36.9 C)-100.7 F (38.2 C)] 98.6 F (37 C) (07/24 0800) Pulse Rate:  [90-126] 98 (07/24 0800) Resp:  [18-26] 20 (07/24 0800) BP: (119-173)/(52-112) 160/72 (07/24 0800) SpO2:  [90 %-99 %] 90 % (07/24 0800) Arterial Line BP: (87-170)/(46-91) 147/59 (07/24 0800) FiO2 (%):  [40 %] 40 % (07/24 0748) Weight:  [87.7 kg] 87.7 kg (07/24 0400)  Hemodynamic parameters for last 24 hours:    Intake/Output from previous day: 07/23 0701 - 07/24 0700 In: 3052.9 [I.V.:1542.9; NG/GT:1510] Out: 3755 [Urine:3755]  Intake/Output this shift: Total I/O In: 229.6 [I.V.:129.6; NG/GT:100] Out: 0   Vent settings for last 24 hours: Vent Mode: PRVC FiO2 (%):  [40  %] 40 % Set Rate:  [20 bmp] 20 bmp Vt Set:  [580 mL] 580 mL PEEP:  [5 cmH20] 5 cmH20 Plateau Pressure:  [19 cmH20-23 cmH20] 23 cmH20  Physical Exam:  Gen: intubated, arousable HEENT: ETT and OG in position Resp: assisted Cardiovascular: RRR Abdomen: soft, NT, ND Ext: no edema, R amputation in foam Neuro: moves all extremities, not following commands   Assessment/Plan:   [facility]   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 Aundria Rud 7/14, revision to transtibial amputation 7/20 with Dr. Lajoyce Corners Occult L PTX - resolved Bilateral pleural effusions - diuresed last few days, no apparent respiratory compromise.  Continue diuresis with 40 IV lasix today Acute hypoxic ventilator dependent respiratory failure - weaning attempts previously unsuccessful due to agitation leaving to profound desaturation episodes, meds adjusted today, continue to try. Just starting to wean sedation towards weaning trial for today Delirium/agitation - continue klonopin/seroquel. On fent at 400, propofol at 70, precedex at 1, getting oxy PO and PRN versed. Off ketamine now. H/o tobacco abuse, has nicotine patch for withdrawal. Parents state no alcohol use at all, but girlfried reports nightly ingestion of 2-4 shots of vodka to aid in sleep, continue CIWA for possible alcohol withdrawal. Aortic transection with associated dissection flap - S/P TEVAR by Dr. Lenell Antu  7/14, ASA81 prior to discharge G3 liver laceration - repeat CT A/P 7/17 due to drop in hgb, slight increase in hemoperitoneum but no active source of bleeding--presumed liver source. CTA 3x phase 7/22 with improvement. FEN - Continue tube feeds ABL anemia - stable today, monitor and transfuse if needed DVT - SCDs, continue LMWH Foley - removed 7/22 Dispo - ICU   LOS: 10 days   Additional comments:I have discussed and reviewed with family members patient's goals for optimizing safe extubation in next 48 h and risks of rushing extubation  Critical  Care Total Time*: 35 minutes  De Blanch Levie Wages 12/26/2020  *Care during the described time interval was provided by me and/or other providers on the critical care team.  I have reviewed this patient's available data, including medical history, events of note, physical examination and test results as part of my evaluation.

## 2020-12-26 NOTE — Progress Notes (Signed)
PT Cancellation Note  Patient Details Name: Christian Hartman MRN: [id number] DOB: 1990/07/08   Cancelled Treatment:    Reason Eval/Treat Not Completed: Patient not medically ready  Will reassess for appropriateness 12/27/20.   [name], PT Pager [phone]   [name] 12/26/2020, 9:18 AM

## 2020-12-27 ENCOUNTER — Inpatient Hospital Stay (HOSPITAL_COMMUNITY): Payer: Self-pay

## 2020-12-27 LAB — BASIC METABOLIC PANEL
Anion gap: 12 (ref 5–15)
BUN: 30 mg/dL — ABNORMAL HIGH (ref 6–20)
CO2: 27 mmol/L (ref 22–32)
Calcium: 9.3 mg/dL (ref 8.9–10.3)
Chloride: 97 mmol/L — ABNORMAL LOW (ref 98–111)
Creatinine, Ser: 0.91 mg/dL (ref 0.61–1.24)
GFR, Estimated: >60 mL/min (ref >60)
Glucose, Bld: 137 mg/dL — ABNORMAL HIGH (ref 70–99)
Potassium: 3.6 mmol/L (ref 3.5–5.1)
Sodium: 136 mmol/L (ref 135–145)

## 2020-12-27 LAB — GLUCOSE, CAPILLARY
Glucose-Capillary: 117 mg/dL — ABNORMAL HIGH (ref 70–99)
Glucose-Capillary: 114 mg/dL — ABNORMAL HIGH (ref 70–99)
Glucose-Capillary: 115 mg/dL — ABNORMAL HIGH (ref 70–99)
Glucose-Capillary: 110 mg/dL — ABNORMAL HIGH (ref 70–99)
Glucose-Capillary: 115 mg/dL — ABNORMAL HIGH (ref 70–99)
Glucose-Capillary: 127 mg/dL — ABNORMAL HIGH (ref 70–99)

## 2020-12-27 LAB — TRIGLYCERIDES: Triglycerides: 193 mg/dL — ABNORMAL HIGH (ref <150)

## 2020-12-27 LAB — SURGICAL PATHOLOGY

## 2020-12-27 MED ORDER — METOCLOPRAMIDE HCL 5 MG/ML IJ SOLN
5.0000 mg | Freq: Three times a day (TID) | INTRAMUSCULAR | Status: DC
  Administered 2020-12-27 – 2020-12-28 (×3): 5 mg via INTRAVENOUS
  Filled 2020-12-27 (×3): qty 2

## 2020-12-27 MED ORDER — SODIUM CHLORIDE 0.9 % IV SOLN
25.0000 mg | Freq: Four times a day (QID) | INTRAVENOUS | Status: DC | PRN
  Administered 2020-12-27 – 2020-12-28 (×4): 25 mg via INTRAVENOUS
  Filled 2020-12-27 (×4): qty 1

## 2020-12-27 MED ORDER — ORAL CARE MOUTH RINSE
15.0000 mL | Freq: Two times a day (BID) | OROMUCOSAL | Status: DC
  Administered 2020-12-28 – 2020-12-29 (×4): 15 mL via OROMUCOSAL

## 2020-12-27 MED ORDER — MORPHINE SULFATE (PF) 2 MG/ML IV SOLN
2.0000 mg | INTRAVENOUS | Status: DC | PRN
  Administered 2020-12-28 (×3): 2 mg via INTRAVENOUS
  Filled 2020-12-27 (×3): qty 1

## 2020-12-27 MED ORDER — ASPIRIN 81 MG PO CHEW
81.0000 mg | CHEWABLE_TABLET | Freq: Every day | ORAL | Status: DC
  Filled 2020-12-27: qty 1

## 2020-12-27 MED ORDER — CHLORHEXIDINE GLUCONATE CLOTH 2 % EX PADS
6.0000 | MEDICATED_PAD | Freq: Every day | CUTANEOUS | Status: DC
  Administered 2020-12-28 – 2020-12-29 (×2): 6 via TOPICAL

## 2020-12-27 MED ORDER — CHLORHEXIDINE GLUCONATE 0.12 % MT SOLN
15.0000 mL | Freq: Two times a day (BID) | OROMUCOSAL | Status: DC
  Administered 2020-12-27 – 2020-12-30 (×6): 15 mL via OROMUCOSAL
  Filled 2020-12-27 (×2): qty 15

## 2020-12-27 MED ORDER — ASPIRIN 81 MG PO CHEW
81.0000 mg | CHEWABLE_TABLET | Freq: Every day | ORAL | Status: DC
  Administered 2020-12-27 – 2020-12-29 (×3): 81 mg
  Filled 2020-12-27 (×2): qty 1

## 2020-12-27 NOTE — Progress Notes (Signed)
Pt with persistent nausea/vomiting. Notified MD Lovick. Verbal order obtained for phenergan 25mg  q6h PRN for nausea/vomiting. See MAR.

## 2020-12-27 NOTE — Procedures (Signed)
Extubation Procedure Note  Patient Details:   Name: Zayvien Canning DOB: [birth year] MRN: [id number]   Airway Documentation:    Vent end date: 12/27/20 Vent end time: 0914   Evaluation  O2 sats: stable throughout Complications: No apparent complications Patient did tolerate procedure well. Bilateral Breath Sounds: Clear, Diminished   Yes  Patient extubated per MD order. Positive cuff leak. No stridor noted. Vitals are stable on 5 L [place]. Patient has good strong cough. RN suctioned mouth and oral secretions.   Kristina Bertone H Deaisha Welborn 12/27/2020, 9:15 AM

## 2020-12-27 NOTE — Progress Notes (Signed)
Wasted 21ml Ketamine 500mg /178ml in the stericycle with 80m RN.

## 2020-12-27 NOTE — Progress Notes (Signed)
Patient with an episode of emesis. Clinically seems like some element of aspiration. CXR ordered, anti-emetic administered. Hold TF for now. Start reglan, order placed for DHT to be advanced post-pyloric. Restart TF in AM if nausea resolved.    Diamantina Monks, MD General and Trauma Surgery [facility]

## 2020-12-27 NOTE — Progress Notes (Signed)
Trauma/Critical Care Follow Up Note  Subjective:    Overnight Issues:   Objective:  Vital signs for last 24 hours: Temp:  [98 F (36.7 C)-99.9 F (37.7 C)] 98 F (36.7 C) (07/25 0800) Pulse Rate:  [78-115] 94 (07/25 0816) Resp:  [19-25] 25 (07/25 0816) BP: (112-159)/(52-83) 138/76 (07/25 0800) SpO2:  [87 %-100 %] 93 % (07/25 0914) Arterial Line BP: (86-162)/(47-84) 125/52 (07/25 0700) FiO2 (%):  [40 %] 40 % (07/25 0816) Weight:  [89.2 kg] 89.2 kg (07/25 0400)  Hemodynamic parameters for last 24 hours:    Intake/Output from previous day: 07/24 0701 - 07/25 0700 In: 2858.4 [I.V.:1658.4; NG/GT:1200] Out: 3450 [Urine:3450]  Intake/Output this shift: Total I/O In: -  Out: 150 [Urine:150]  Vent settings for last 24 hours: Vent Mode: PSV;CPAP FiO2 (%):  [40 %] 40 % Set Rate:  [20 bmp] 20 bmp Vt Set:  [580 mL] 580 mL PEEP:  [5 cmH20] 5 cmH20 Pressure Support:  [10 cmH20] 10 cmH20 Plateau Pressure:  [20 cmH20] 20 cmH20  Physical Exam:  Gen: comfortable, no distress Neuro: non-focal exam, f/c HEENT: PERRL Neck: supple CV: RRR Pulm: unlabored breathing Abd: soft, NT GU: clear yellow urine Extr: wwp, no edema   Results for orders placed or performed during the hospital encounter of 12/16/20 (from the past 24 hour(s))  Glucose, capillary     Status: Abnormal   Collection Time: 12/26/20 12:04 PM  Result Value Ref Range   Glucose-Capillary 133 (H) 70 - 99 mg/dL  Glucose, capillary     Status: Abnormal   Collection Time: 12/26/20  3:35 PM  Result Value Ref Range   Glucose-Capillary 159 (H) 70 - 99 mg/dL  Glucose, capillary     Status: Abnormal   Collection Time: 12/26/20  7:14 PM  Result Value Ref Range   Glucose-Capillary 108 (H) 70 - 99 mg/dL  Glucose, capillary     Status: Abnormal   Collection Time: 12/26/20 11:13 PM  Result Value Ref Range   Glucose-Capillary 154 (H) 70 - 99 mg/dL  Glucose, capillary     Status: Abnormal   Collection Time: 12/27/20   4:09 AM  Result Value Ref Range   Glucose-Capillary 117 (H) 70 - 99 mg/dL  Triglycerides     Status: Abnormal   Collection Time: 12/27/20  4:48 AM  Result Value Ref Range   Triglycerides 193 (H) <150 mg/dL  Basic metabolic panel     Status: Abnormal   Collection Time: 12/27/20  4:48 AM  Result Value Ref Range   Sodium 136 135 - 145 mmol/L   Potassium 3.6 3.5 - 5.1 mmol/L   Chloride 97 (L) 98 - 111 mmol/L   CO2 27 22 - 32 mmol/L   Glucose, Bld 137 (H) 70 - 99 mg/dL   BUN 30 (H) 6 - 20 mg/dL   Creatinine, Ser 2.95 0.61 - 1.24 mg/dL   Calcium 9.3 8.9 - 28.4 mg/dL   GFR, Estimated >13 >24 mL/min   Anion gap 12 5 - 15  Glucose, capillary     Status: Abnormal   Collection Time: 12/27/20  7:20 AM  Result Value Ref Range   Glucose-Capillary 114 (H) 70 - 99 mg/dL    Assessment & Plan: The plan of care was discussed with the bedside nurse for the day, who is in agreement with this plan  (extubate, PT/OT/SLP, start ASA, d/c PPI) and no additional concerns were raised.   Present on Admission: **None**    LOS: 11 days  Additional comments:I reviewed the patient's new clinical lab test results.   and I reviewed the patients new imaging test results.    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 Aundria Rud 7/14, revision to transtibial amputation 7/20 with Dr. Lajoyce Corners Occult L PTX - resolved Bilateral pleural effusions - diuresed, no apparent respiratory compromise Acute hypoxic ventilator dependent respiratory failure - PSV and extubate today  Delirium/agitation - continue klonopin/seroquel. Sedation req'ts decreased. H/o tobacco abuse, has nicotine patch for withdrawal. Parents state no alcohol use at all, but girlfried reports nightly ingestion of 2-4 shots of vodka to aid in sleep, continue CIWA for possible alcohol withdrawal. Aortic transection with associated dissection flap - S/P TEVAR by Dr. Lenell Antu 7/14, ASA81 start 7/25 G3 liver laceration - repeat CT A/P 7/17  due to drop in hgb, slight increase in hemoperitoneum but no active source of bleeding--presumed liver source. CTA 3x phase 7/22 without PSA FEN - continue tube feeds, d/c protonix ABL anemia - stable today, monitor and transfuse if needed DVT - SCDs, continue LMWH Foley - removed 7/22 Dispo - ICU, PT/OT/SLP    Critical Care Total Time: 40 minutes  Diamantina Monks, MD Trauma & General Surgery Please use [email or web address] to contact on call provider  12/27/2020  *Care during the described time interval was provided by me. I have reviewed this patient's available data, including medical history, events of note, physical examination and test results as part of my evaluation.

## 2020-12-27 NOTE — Progress Notes (Signed)
Inpatient Rehab Admissions Coordinator Note:   Per PT recommendation, pt was screened for CIR candidacy by Wolfgang Phoenix, MS, CCC-SLP.  At this time we are not recommending an inpatient rehab consult. Will follow from a distance pt's medical workup and progress with therapy.  Please contact me with questions.     Wolfgang Phoenix, MS, CCC-SLP Admissions Coordinator [phone] 12/27/20 5:07 PM

## 2020-12-27 NOTE — Progress Notes (Signed)
[facility] [place] PROGRESS NOTE  ASSESSMENT / PLAN: Christian Hartman is a 30 y.o. 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  Extubated successfully today.  He is motor and sensory intact in the left upper extremity.  No role for left subclavian revascularization.  Encourage PT/OT/out of bed.  Aspirin 81 mg by mouth daily indefinitely once safe from a trauma perspective.  Follow-up with me in 1 month with CTA of the chest.  Please call for questions  SUBJECTIVE: Extubated today.  Doing well.  No trouble on his left hand.  OBJECTIVE: BP 138/76   Pulse 94   Temp 98 F (36.7 C) (Axillary)   Resp (!) 25   Ht 5\' 10"  (1.778 m)   Wt 89.2 kg   SpO2 93%   BMI 28.22 kg/m   Intake/Output Summary (Last 24 hours) at 12/27/2020 1034 Last data filed at 12/27/2020 0800 Gross per 24 hour  Intake 2628.89 ml  Output 3600 ml  Net -971.11 ml    Constitutional: well appearing. no acute distress. CNS: LUE 5/5. No sensory deficits. Cardiac: RRR.  CBC Latest Ref Rng & Units 12/25/2020 12/24/2020 12/24/2020  WBC 4.0 - 10.5 K/uL 13.3(H) 13.5(H) 11.6(H)  Hemoglobin 13.0 - 17.0 g/dL 8.3(L) 8.6(L) 7.9(L)  Hematocrit 39.0 - 52.0 % 27.0(L) 27.2(L) 25.0(L)  Platelets 150 - 400 K/uL 407(H) 387 324     CMP Latest Ref Rng & Units 12/27/2020 12/25/2020 12/24/2020  Glucose 70 - 99 mg/dL 12/26/2020) 250(N) 397(Q)  BUN 6 - 20 mg/dL 734(L) 93(X) 19  Creatinine 0.61 - 1.24 mg/dL 90(W 4.09 7.35  Sodium 135 - 145 mmol/L 136 137 139  Potassium 3.5 - 5.1 mmol/L 3.6 3.8 3.4(L)  Chloride 98 - 111 mmol/L 97(L) 100 102  CO2 22 - 32 mmol/L 27 27 28   Calcium 8.9 - 10.3 mg/dL 9.3 9.1 3.29)  Total Protein 6.5 - 8.1 g/dL - - 6.1(L)  Total Bilirubin 0.3 - 1.2 mg/dL - - 1.0  Alkaline Phos 38 - 126 U/L - - 51  AST 15 - 41 U/L - - 140(H)  ALT 0 - 44 U/L - - 85(H)    Estimated Creatinine Clearance: 134.7 mL/min (by C-G  formula based on SCr of 0.91 mg/dL).  . 9.2(E, MD [facility] Phone Number: [phone] 12/27/2020 10:34 AM

## 2020-12-27 NOTE — Progress Notes (Signed)
Wasted entire bag of unused fentanyl in trash receptacle with RN Titus Mould. Not needed by patient

## 2020-12-27 NOTE — Evaluation (Signed)
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 pt extubated on 7/25. PMH and PSH: unremarkable.   Clinical Impression  Pt extubated earlier today. Pt with noted bilat UE weakness L worse than R, R BKA, and generalized deconditioning with delayed processing/comprehension of situation and functional deficits. Pt was indep PTA now requiring maxA for all mobility. Pt unable to sit EOB without support. Pt with mild diaphoresis, HR at 120 while pt EOB. Pt eager to get better. Recommend CIR upon d/c for maximal functional recovery. Pt with good home set up and support. Acute PT to cont to follow.     Follow Up Recommendations CIR    Equipment Recommendations  None recommended by PT (TBD at next venue)    Recommendations for Other Services Rehab consult     Precautions / Restrictions Precautions Precautions: Fall Precaution Comments: R BKA Required Braces or Orthoses: Other Brace Other Brace: limb protector on the R Restrictions Weight Bearing Restrictions: Yes RLE Weight Bearing: Non weight bearing      Mobility  Bed Mobility Overal bed mobility: Needs Assistance Bed Mobility: Rolling;Sidelying to Sit;Sit to Sidelying Rolling: Mod assist Sidelying to sit: Max assist     Sit to sidelying: Max assist General bed mobility comments: pt with generalized weakness and limited bilat UE strength to assist, maxA for trunk elevation, pt able to bring LEs off EOB with increased time, pt unable to hold head up at EOB    Transfers                 General transfer comment: deferred this date  Ambulation/Gait             General Gait Details: unable at this time  Stairs            Wheelchair  Mobility    Modified Rankin (Stroke Patients Only)       Balance Overall balance assessment: Needs assistance Sitting-balance support: Feet supported;Bilateral upper extremity supported Sitting balance-Leahy Scale: Poor Sitting balance - Comments: pt requiring maxA to maintain EOB balance, pt unable to hold head up >5 sec, pt with tendency to fall forward, pt with bilat UE weakness and unable to support self to maintain position                                     Pertinent Vitals/Pain Pain Assessment: No/denies pain    Home Living Family/patient expects to be discharged to:: Private residence Living Arrangements: Spouse/significant other Available Help at Discharge: Family;Available 24 hours/day Type of Home: House Home Access: Level entry     Home Layout: One level   Additional Comments: lives with fiance who is Production designer, theatre/television/film at [employer], mother can stay with patient    Prior Function Level of Independence: Independent         Comments: wasn't working     Higher education careers adviser   Dominant Hand: Right    Extremity/Trunk Assessment   Upper Extremity Assessment Upper Extremity Assessment: RUE deficits/detail;LUE deficits/detail RUE Deficits / Details: grossly 4-/5 LUE Deficits / Details: grossly 3/5    Lower Extremity Assessment Lower Extremity Assessment: LLE deficits/detail;RLE deficits/detail RLE Deficits /  Details: BKA, pt able to initiated knee and hip flexion LLE Deficits / Details: generalized weakness    Cervical / Trunk Assessment Cervical / Trunk Assessment: Normal  Communication   Communication: Expressive difficulties (soft spoken from being intubated)  Cognition Arousal/Alertness: Awake/alert Behavior During Therapy: Restless (irritated with NG tube) Overall Cognitive Status: Impaired/Different from baseline Area of Impairment: Problem solving                             Problem Solving: Difficulty sequencing;Requires verbal  cues;Requires tactile cues;Slow processing General Comments: pt eager to get up but with decreased insight to deficits and extent of injuries      General Comments General comments (skin integrity, edema, etc.): wound vac to R LE intact    Exercises Amputee Exercises Hip Flexion/Marching: AROM;Right;10 reps;Supine Other Exercises Other Exercises: AAROM to bilat UE into flexion at shoulder, elbow and wrist   Assessment/Plan    PT Assessment Patient needs continued PT services  PT Problem List Decreased strength;Decreased range of motion;Decreased activity tolerance;Decreased balance;Decreased mobility;Decreased coordination;Decreased cognition;Decreased knowledge of use of DME       PT Treatment Interventions DME instruction;Gait training;Functional mobility training;Stair training;Therapeutic activities;Therapeutic exercise;Balance training;Neuromuscular re-education    PT Goals (Current goals can be found in the Care Plan section)  Acute Rehab PT Goals Patient Stated Goal: get the tube out of my nose PT Goal Formulation: With patient/family Time For Goal Achievement: 01/10/21 Potential to Achieve Goals: Good    Frequency Min 4X/week   Barriers to discharge        Co-evaluation               AM-PAC PT "6 Clicks" Mobility  Outcome Measure Help needed turning from your back to your side while in a flat bed without using bedrails?: A Lot Help needed moving from lying on your back to sitting on the side of a flat bed without using bedrails?: A Lot Help needed moving to and from a bed to a chair (including a wheelchair)?: A Lot Help needed standing up from a chair using your arms (e.g., wheelchair or bedside chair)?: A Lot Help needed to walk in hospital room?: Total Help needed climbing 3-5 steps with a railing? : Total 6 Click Score: 10    End of Session Equipment Utilized During Treatment: Oxygen Activity Tolerance: Patient tolerated treatment well Patient left:  in bed;with call bell/phone within reach;with bed alarm set;with family/visitor present Nurse Communication: Mobility status PT Visit Diagnosis: Unsteadiness on feet (R26.81);Muscle weakness (generalized) (M62.81);Difficulty in walking, not elsewhere classified (R26.2)    Time: 6160-7371 PT Time Calculation (min) (ACUTE ONLY): 24 min   Charges:   PT Evaluation $PT Eval Moderate Complexity: 1 Mod PT Treatments $Neuromuscular Re-education: 8-22 mins        Lewis Shock, PT, DPT Acute Rehabilitation Services Pager #: [phone] Office #: [phone]   Iona Hansen 12/27/2020, 3:33 PM

## 2020-12-28 ENCOUNTER — Inpatient Hospital Stay (HOSPITAL_COMMUNITY): Payer: Self-pay

## 2020-12-28 ENCOUNTER — Ambulatory Visit (HOSPITAL_COMMUNITY): Payer: No Payment, Other | Admitting: Physician Assistant

## 2020-12-28 LAB — BASIC METABOLIC PANEL
Anion gap: 8 (ref 5–15)
BUN: 26 mg/dL — ABNORMAL HIGH (ref 6–20)
CO2: 25 mmol/L (ref 22–32)
Calcium: 9 mg/dL (ref 8.9–10.3)
Chloride: 107 mmol/L (ref 98–111)
Creatinine, Ser: 0.96 mg/dL (ref 0.61–1.24)
GFR, Estimated: >60 mL/min (ref >60)
Glucose, Bld: 120 mg/dL — ABNORMAL HIGH (ref 70–99)
Potassium: 4.4 mmol/L (ref 3.5–5.1)
Sodium: 140 mmol/L (ref 135–145)

## 2020-12-28 LAB — CBC
HCT: 25.5 % — ABNORMAL LOW (ref 39.0–52.0)
Hemoglobin: 8 g/dL — ABNORMAL LOW (ref 13.0–17.0)
MCH: 29.9 pg (ref 26.0–34.0)
MCHC: 31.4 g/dL (ref 30.0–36.0)
MCV: 95.1 fL (ref 80.0–100.0)
Platelets: 549 10*3/uL — ABNORMAL HIGH (ref 150–400)
RBC: 2.68 MIL/uL — ABNORMAL LOW (ref 4.22–5.81)
RDW: 17.6 % — ABNORMAL HIGH (ref 11.5–15.5)
WBC: 16.8 10*3/uL — ABNORMAL HIGH (ref 4.0–10.5)
nRBC: 0.1 % (ref 0.0–0.2)

## 2020-12-28 LAB — GLUCOSE, CAPILLARY
Glucose-Capillary: 108 mg/dL — ABNORMAL HIGH (ref 70–99)
Glucose-Capillary: 124 mg/dL — ABNORMAL HIGH (ref 70–99)
Glucose-Capillary: 110 mg/dL — ABNORMAL HIGH (ref 70–99)
Glucose-Capillary: 122 mg/dL — ABNORMAL HIGH (ref 70–99)
Glucose-Capillary: 116 mg/dL — ABNORMAL HIGH (ref 70–99)

## 2020-12-28 MED ORDER — SCOPOLAMINE 1 MG/3DAYS TD PT72
1.0000 | MEDICATED_PATCH | TRANSDERMAL | Status: DC
  Administered 2020-12-28: 1.5 mg via TRANSDERMAL
  Filled 2020-12-28: qty 1

## 2020-12-28 MED ORDER — METHOCARBAMOL 1000 MG/10ML IJ SOLN
1000.0000 mg | Freq: Three times a day (TID) | INTRAVENOUS | Status: DC
  Administered 2020-12-28 – 2020-12-30 (×6): 1000 mg via INTRAVENOUS
  Filled 2020-12-28: qty 10
  Filled 2020-12-28: qty 1000
  Filled 2020-12-28 (×3): qty 10
  Filled 2020-12-28: qty 1000
  Filled 2020-12-28: qty 10

## 2020-12-28 MED ORDER — LACTATED RINGERS IV SOLN: INTRAVENOUS | Status: DC

## 2020-12-28 MED ORDER — PANTOPRAZOLE SODIUM 40 MG IV SOLR
40.0000 mg | INTRAVENOUS | Status: DC
  Administered 2020-12-28: 40 mg via INTRAVENOUS
  Filled 2020-12-28: qty 40

## 2020-12-28 MED ORDER — LACTATED RINGERS IV BOLUS
1000.0000 mL | Freq: Once | INTRAVENOUS | Status: AC
  Administered 2020-12-28: 1000 mL via INTRAVENOUS

## 2020-12-28 MED ORDER — LORAZEPAM 2 MG/ML IJ SOLN
0.2500 mg | INTRAMUSCULAR | Status: DC | PRN
  Administered 2020-12-28 (×2): 0.25 mg via INTRAVENOUS
  Filled 2020-12-28 (×2): qty 1

## 2020-12-28 MED ORDER — METOCLOPRAMIDE HCL 5 MG/ML IJ SOLN
10.0000 mg | Freq: Three times a day (TID) | INTRAMUSCULAR | Status: DC
  Administered 2020-12-28 – 2020-12-30 (×6): 10 mg via INTRAVENOUS
  Filled 2020-12-28 (×6): qty 2

## 2020-12-28 NOTE — Progress Notes (Signed)
SLP Cancellation Note  Patient Details Name: Christian Hartman MRN: [id number] DOB: [birth year]   Cancelled treatment:        Discussed pt's status with RN who informed this therapist that he vomited tube feeds, lethargic/sleepy (little sleep last night) and to defer evaluations today.    Royce Macadamia 12/28/2020, 12:25 PM  Breck Coons Lonell Face.Ed Nurse, children's [phone] Office [phone]

## 2020-12-28 NOTE — Plan of Care (Signed)
  Problem: Education: Goal: Knowledge of General Education information will improve Description: Including pain rating scale, medication(s)/side effects and non-pharmacologic comfort measures Outcome: Progressing   Problem: Coping: Goal: Level of anxiety will decrease Outcome: Progressing   Problem: Elimination: Goal: Will not experience complications related to bowel motility Outcome: Progressing Goal: Will not experience complications related to urinary retention Outcome: Progressing   Problem: Safety: Goal: Ability to remain free from injury will improve Outcome: Progressing   Problem: Nutrition: Goal: Adequate nutrition will be maintained Outcome: Not Progressing

## 2020-12-28 NOTE — Progress Notes (Signed)
Immediately after administering per tube daily mediations patient began to vomit. Made MD aware. Will be kept completely NPO per MD Lovick.

## 2020-12-28 NOTE — Progress Notes (Signed)
OT Cancellation Note  Patient Details Name: Kaison Mcparland MRN: [id number] DOB: 1990-07-18   Cancelled Treatment:    Reason Eval/Treat Not Completed: Patient not medically ready Rn requesting to hold at this time.  Wynona Neat, OTR/L  Acute Rehabilitation Services Pager: [phone] Office: [phone] .  12/28/2020, 10:19 AM

## 2020-12-28 NOTE — Progress Notes (Signed)
Trauma/Critical Care Follow Up Note  Subjective:    Overnight Issues: continued n/v  Objective:  Vital signs for last 24 hours: Temp:  [98 F (36.7 C)-100.3 F (37.9 C)] 98.2 F (36.8 C) (07/26 0800) Pulse Rate:  [68-112] 76 (07/26 1100) Resp:  [13-39] 33 (07/26 1100) BP: (75-168)/(58-137) 94/69 (07/26 1100) SpO2:  [91 %-100 %] 99 % (07/26 1100)  Hemodynamic parameters for last 24 hours:    Intake/Output from previous day: 07/25 0701 - 07/26 0700 In: 617.7 [I.V.:416.7; NG/GT:100.8; IV Piggyback:100.2] Out: 1900 [Urine:1900]  Intake/Output this shift: Total I/O In: 43.5 [I.V.:43.5] Out: 130 [Urine:50; Emesis/NG output:80]  Vent settings for last 24 hours:    Physical Exam:  Gen: uncomfortable due to emesis, no distress Neuro: non-focal exam HEENT: PERRL Neck: supple CV: RRR Pulm: unlabored breathing Abd: soft, NT, actively vomiting GU: clear yellow urine Extr: wwp, no edema    Results for orders placed or performed during the hospital encounter of 12/16/20 (from the past 24 hour(s))  Glucose, capillary     Status: Abnormal   Collection Time: 12/27/20 12:02 PM  Result Value Ref Range   Glucose-Capillary 115 (H) 70 - 99 mg/dL  Glucose, capillary     Status: Abnormal   Collection Time: 12/27/20  3:39 PM  Result Value Ref Range   Glucose-Capillary 110 (H) 70 - 99 mg/dL  Glucose, capillary     Status: Abnormal   Collection Time: 12/27/20  7:45 PM  Result Value Ref Range   Glucose-Capillary 115 (H) 70 - 99 mg/dL  Glucose, capillary     Status: Abnormal   Collection Time: 12/27/20 11:17 PM  Result Value Ref Range   Glucose-Capillary 127 (H) 70 - 99 mg/dL  Glucose, capillary     Status: Abnormal   Collection Time: 12/28/20  3:30 AM  Result Value Ref Range   Glucose-Capillary 108 (H) 70 - 99 mg/dL  CBC     Status: Abnormal   Collection Time: 12/28/20  5:54 AM  Result Value Ref Range   WBC 16.8 (H) 4.0 - 10.5 K/uL   RBC 2.68 (L) 4.22 - 5.81 MIL/uL    Hemoglobin 8.0 (L) 13.0 - 17.0 g/dL   HCT 78.2 (L) 95.6 - 21.3 %   MCV 95.1 80.0 - 100.0 fL   MCH 29.9 26.0 - 34.0 pg   MCHC 31.4 30.0 - 36.0 g/dL   RDW 08.6 (H) 57.8 - 46.9 %   Platelets 549 (H) 150 - 400 K/uL   nRBC 0.1 0.0 - 0.2 %  Basic metabolic panel     Status: Abnormal   Collection Time: 12/28/20  5:54 AM  Result Value Ref Range   Sodium 140 135 - 145 mmol/L   Potassium 4.4 3.5 - 5.1 mmol/L   Chloride 107 98 - 111 mmol/L   CO2 25 22 - 32 mmol/L   Glucose, Bld 120 (H) 70 - 99 mg/dL   BUN 26 (H) 6 - 20 mg/dL   Creatinine, Ser 6.29 0.61 - 1.24 mg/dL   Calcium 9.0 8.9 - 52.8 mg/dL   GFR, Estimated >41 >32 mL/min   Anion gap 8 5 - 15    Assessment & Plan: The plan of care was discussed with the bedside nurse for the day, who is in agreement with this plan (nausea control-scop patch, strict NPO incl meds, incr reglan, maybe NGT, resp cx) and no additional concerns were raised.   Present on Admission: **None**    LOS: 12 days   Additional  comments:I reviewed the patient's new clinical lab test results.   and I reviewed the patients new imaging test results.    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 Aundria Rud 7/14, revision to transtibial amputation 7/20 with Dr. Lajoyce Corners Occult L PTX - resolved Bilateral pleural effusions - diuresed, cont pulm toilet Acute hypoxic ventilator dependent respiratory failure - extubated 7/25, refractory n/v and emesis, monitor closely for need to reintubate, send resp cx Delirium/agitation - much improved. H/o tobacco abuse, has nicotine patch for withdrawal. Parents state no alcohol use at all, but girlfried reports nightly ingestion of 2-4 shots of vodka to aid in sleep, continue CIWA for possible alcohol withdrawal. Refractory n/v - already on zofran/phenergan, TF off since yest, strict NPO now, added scop patch today, increased reglan to 10 TID, may drop NG if persists. D/w RPh re: contributing meds  Aortic transection with  associated dissection flap - S/P TEVAR by Dr. Lenell Antu 7/14, ASA81 start 7/25, follow-up in 1 month with CTA of the chest G3 liver laceration - repeat CT A/P 7/17 due to drop in hgb, slight increase in hemoperitoneum but no active source of bleeding--presumed liver source. CTA 3x phase 7/22 without PSA FEN - hold tube feeds, restart PPI ABL anemia - stable today, monitor and transfuse if needed DVT - SCDs, continue LMWH Dispo - ICU, PT/OT/SLP, maybe 4NP late today vs AM  Diamantina Monks, MD Trauma & General Surgery Please use [email or web address] to contact on call provider  12/28/2020  *Care during the described time interval was provided by me. I have reviewed this patient's available data, including medical history, events of note, physical examination and test results as part of my evaluation.

## 2020-12-28 NOTE — Progress Notes (Signed)
PT Cancellation Note  Patient Details Name: Christian Hartman MRN: [id number] DOB: [birth year]   Cancelled Treatment:    Reason Eval/Treat Not Completed: Patient not medically ready. Pt with frequent emesis, didn't sleep at all last night, and tachycardic. PT to hold today. Acute PT to return as able.  Lewis Shock, PT, DPT Acute Rehabilitation Services Pager #: [phone] Office #: [phone]    Iona Hansen 12/28/2020, 11:43 AM

## 2020-12-29 ENCOUNTER — Inpatient Hospital Stay (HOSPITAL_COMMUNITY): Payer: Self-pay

## 2020-12-29 LAB — CBC
HCT: 25.5 % — ABNORMAL LOW (ref 39.0–52.0)
Hemoglobin: 8 g/dL — ABNORMAL LOW (ref 13.0–17.0)
MCH: 30.3 pg (ref 26.0–34.0)
MCHC: 31.4 g/dL (ref 30.0–36.0)
MCV: 96.6 fL (ref 80.0–100.0)
Platelets: 557 10*3/uL — ABNORMAL HIGH (ref 150–400)
RBC: 2.64 MIL/uL — ABNORMAL LOW (ref 4.22–5.81)
RDW: 17.5 % — ABNORMAL HIGH (ref 11.5–15.5)
WBC: 16.8 10*3/uL — ABNORMAL HIGH (ref 4.0–10.5)
nRBC: 0.2 % (ref 0.0–0.2)

## 2020-12-29 LAB — BASIC METABOLIC PANEL
Anion gap: 10 (ref 5–15)
BUN: 23 mg/dL — ABNORMAL HIGH (ref 6–20)
CO2: 21 mmol/L — ABNORMAL LOW (ref 22–32)
Calcium: 8.8 mg/dL — ABNORMAL LOW (ref 8.9–10.3)
Chloride: 109 mmol/L (ref 98–111)
Creatinine, Ser: 0.88 mg/dL (ref 0.61–1.24)
GFR, Estimated: >60 mL/min (ref >60)
Glucose, Bld: 119 mg/dL — ABNORMAL HIGH (ref 70–99)
Potassium: 3.9 mmol/L (ref 3.5–5.1)
Sodium: 140 mmol/L (ref 135–145)

## 2020-12-29 LAB — EXPECTORATED SPUTUM ASSESSMENT W GRAM STAIN, RFLX TO RESP C

## 2020-12-29 LAB — GLUCOSE, CAPILLARY
Glucose-Capillary: 106 mg/dL — ABNORMAL HIGH (ref 70–99)
Glucose-Capillary: 108 mg/dL — ABNORMAL HIGH (ref 70–99)
Glucose-Capillary: 109 mg/dL — ABNORMAL HIGH (ref 70–99)
Glucose-Capillary: 99 mg/dL (ref 70–99)
Glucose-Capillary: 109 mg/dL — ABNORMAL HIGH (ref 70–99)

## 2020-12-29 MED ORDER — PIVOT 1.5 CAL PO LIQD
1140.0000 mL | ORAL | Status: DC
  Filled 2020-12-29: qty 2000

## 2020-12-29 MED ORDER — QUETIAPINE FUMARATE 50 MG PO TABS
50.0000 mg | ORAL_TABLET | Freq: Two times a day (BID) | ORAL | Status: DC
  Administered 2020-12-29: 50 mg
  Filled 2020-12-29: qty 1

## 2020-12-29 MED ORDER — PIVOT 1.5 CAL PO LIQD
1000.0000 mL | ORAL | Status: DC
  Administered 2020-12-29: 1000 mL
  Filled 2020-12-29 (×2): qty 1000

## 2020-12-29 MED ORDER — CLONAZEPAM 0.5 MG PO TABS
0.5000 mg | ORAL_TABLET | Freq: Two times a day (BID) | ORAL | Status: DC
  Administered 2020-12-29: 0.5 mg
  Filled 2020-12-29: qty 1

## 2020-12-29 MED ORDER — PANTOPRAZOLE SODIUM 40 MG PO PACK
40.0000 mg | PACK | Freq: Every day | ORAL | Status: DC
  Administered 2020-12-29: 40 mg
  Filled 2020-12-29: qty 20

## 2020-12-29 MED ORDER — PROSOURCE TF PO LIQD: 45.0000 mL | Freq: Three times a day (TID) | ORAL | Status: DC

## 2020-12-29 NOTE — Progress Notes (Signed)
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 pt extubated on 7/25. PMH and PSH: unremarkable.    PT Comments    Pt rolled each direction with modA to clean and place hoyer pad. Pt coming to long-sit in bed with minA, displaying improved trunk and neck strength/coordination this date. Maxisky used to transfer pt to chair, then performed sit to stand from recliner. Pt needing heavy L knee block due to coordination and strength deficits. Pt performed x30 reps L LAQ to address these deficits, needing continual supervision and repeated cues for correct technique. Pt educated on performing bil glut and quad sets to improve ROM and strength. Pt is very motivated to participate and improve. Will continue to follow acutely. Current recommendations remain appropriate.   Follow Up Recommendations  CIR     Equipment Recommendations  None recommended by PT (TBD at next venue)    Recommendations for Other Services Rehab consult     Precautions / Restrictions Precautions Precautions: Fall Precaution Comments: R BKA Required Braces or Orthoses: Other Brace Other Brace: limb protector on the R Restrictions Weight Bearing Restrictions: Yes RLE Weight Bearing: Non weight bearing    Mobility  Bed Mobility Overal bed mobility: Needs Assistance Bed Mobility: Rolling Rolling: Mod assist         General bed mobility comments: pt rolling R and L due to incontinence. pt attempting to long sit with bed rails and needs min (A) to sustain    Transfers Overall transfer level: Needs assistance   Transfers: Sit to/from Stand Sit to Stand: +2 physical assistance;Max assist         General transfer comment:  sit to stand 1x from recliner, pt requries LLE strongly blocked and weight shift by therapist to come toward L SIde. Pt motivated. pt with poor control of oral secretions and drooling. Maxisky lift to transfer to recliner.  Ambulation/Gait             General Gait Details: unable at this time   Stairs             Wheelchair Mobility    Modified Rankin (Stroke Patients Only)       Balance Overall balance assessment: Needs assistance Sitting-balance support: Bilateral upper extremity supported;Feet supported Sitting balance-Leahy Scale: Fair                                      Cognition Arousal/Alertness: Awake/alert Behavior During Therapy: Flat affect Overall Cognitive Status: Impaired/Different from baseline Area of Impairment: Orientation;Attention;Memory;Following commands;Safety/judgement;Awareness;Problem solving                 Orientation Level: Disoriented to;Time (reports its December 25 2000) Current Attention Level: Sustained Memory: Decreased recall of precautions;Decreased short-term memory Following Commands: Follows one step commands consistently Safety/Judgement: Decreased awareness of deficits Awareness: Emergent Problem Solving: Slow processing;Difficulty sequencing General Comments: pt eager to get up and sing to firefly. Pt easily distracted. Difficulty following directions, needing multi-modal cues for correct technique.      Exercises General Exercises - Lower Extremity Long Arc Quad: Strengthening;Left;Other reps (comment);Seated (30x) Other Exercises Other Exercises: educated on quad sets  with both sides will need reinforcement due to poor return demo of "HOLD" Other Exercises: educated on glute squeezes    General Comments General comments (skin integrity, edema, etc.): limb protector positioned well and pt tolerating well      Pertinent Vitals/Pain Pain Assessment: No/denies pain    Home Living  Family/patient expects to be discharged to:: Private residence Living Arrangements: Spouse/significant other Available Help at Discharge: Family;Available 24 hours/day Type of Home: House Home Access: Level entry   Home Layout: One level Home Equipment: None Additional Comments: lives with fiance who is Production designer, theatre/television/film at [employer], mother can stay with patient    Prior Function Level of Independence: Independent          PT Goals (current goals can now be found in the care plan section) Acute Rehab PT Goals Patient Stated Goal: to get tube from nose PT Goal Formulation: With patient Time For Goal Achievement: 01/10/21 Potential to Achieve Goals: Good Progress towards PT goals: Progressing toward goals    Frequency    Min 4X/week      PT Plan Current plan remains appropriate    Co-evaluation PT/OT/SLP Co-Evaluation/Treatment: Yes Reason for Co-Treatment: Complexity of the patient's impairments (multi-system involvement);Necessary to address cognition/behavior during functional activity;To address functional/ADL transfers;For patient/therapist safety PT goals addressed during session: Mobility/safety with mobility;Balance;Strengthening/ROM OT goals addressed during session: ADL's and self-care;Proper use of Adaptive equipment and DME;Strengthening/ROM      AM-PAC PT "6 Clicks" Mobility   Outcome Measure  Help needed turning from your back to your side while in a flat bed without using bedrails?: A Lot Help needed moving from lying on your back to sitting on the side of a flat bed without using bedrails?: A Little Help needed moving to and from a bed to a chair (including a wheelchair)?: Total Help needed standing up from a chair using your arms (e.g., wheelchair or bedside chair)?: Total Help needed to walk in hospital room?: Total Help needed climbing 3-5 steps with a railing? : Total 6 Click Score: 9    End of Session Equipment Utilized During Treatment: Oxygen;Gait  belt Activity Tolerance: Patient tolerated treatment well Patient left: in chair;with call bell/phone within reach;with chair alarm set Nurse Communication: Mobility status;Need for lift equipment PT Visit Diagnosis: Unsteadiness on feet (R26.81);Muscle weakness (generalized) (M62.81);Difficulty in walking, not elsewhere classified (R26.2)     Time: 1129-1203 PT Time Calculation (min) (ACUTE ONLY): 34 min  Charges:  $Therapeutic Activity: 8-22 mins                     Raymond Gurney, PT, DPT Acute Rehabilitation Services  Pager: [phone] Office: [phone]    Jewel Baize 12/29/2020, 4:14 PM

## 2020-12-29 NOTE — Progress Notes (Signed)
Nutrition Follow Up  DOCUMENTATION CODES:   Not applicable  INTERVENTION:   Tube feeding:  -Pivot 1.5 @ 65 ml/hr via cortrak (1560 ml)  Provides: 2340 kcal, 146 grams protein, and 1184 ml free water.   Encourage PO intake as able  NUTRITION DIAGNOSIS:   Increased nutrient needs related to post-op healing as evidenced by estimated needs.  Ongoing  GOAL:   Patient will meet greater than or equal to 90% of their needs  Addressed via TF  MONITOR:   Skin, I & O's  REASON FOR ASSESSMENT:   Ventilator    ASSESSMENT:   Pt admitted after [facility] with near amputation R distal tib/fib, L PTX, traumatic pseudoaneurysm of descending thoracic aorta, and G3-4 liver lac.   Pt discussed during ICU rounds and with RN. Spoke with Trauma MD will titrate TF to goal and then transition to nocturnal feedings. Pt's diet advanced to Regular, per RN only taking in sips.    7/14 s/p guillotine amputation of R leg below the knee, I&D for open traumatic wound of R lower leg including skin, subcutaneous tissue, muscle and bone, wound VAC placement 7/15 s/p TEVAR 7/18 s/p cortrak placement; tip in antrum of the stomach  7/20 s/p transtibial amputation, application of VAC 7/25 extubated; vomiting, TF held 7/26 vomiting, TF held 7/27 Cortrak advanced post pyloric tip at the jejunum; diet advanced to Regular    Incisional VAC remains in place without output.   Medications: colace, folic acid, reglan, MVI, protonix, thiamine  LR @ 100 ml/h Labs reviewed  Diet Order:   Diet Order             Diet regular Room service appropriate? Yes; Fluid consistency: Thin  Diet effective now                   EDUCATION NEEDS:   Not appropriate for education at this time  Skin:  Skin Assessment:  (R BKA with VAC)  Last BM:  (P) 7/27 large, type 6  Height:   Ht Readings from Last 1 Encounters:  12/16/20 5\' 10"  (1.778 m)    Weight:   Wt Readings from Last 1 Encounters:  12/27/20 80 kg     BMI:  Body mass index is 25.31 kg/m.  Estimated Nutritional Needs:   Kcal:  2300-2600  Protein:  125-135 grams  Fluid:  > 2 L/day  12/29/20 MS, RD, LDN, CNSC Clinical Nutrition Pager listed in AMION
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
## 2020-12-29 NOTE — Progress Notes (Signed)
Cortrak Tube Team Note:  Tube Type:  Cortrak - 43 inches Tube Location:  Left nare Initial Placement:  Stomach, advanced to postpyloric  Secured by: Bridle Technique Used to Measure Tube Placement:  Marking at nare/corner of mouth Cortrak Secured At:  98 cm   Consult received to advance Cortrak feeding tube postpyloric.   X-ray is required, abdominal x-ray has been ordered by the Cortrak team. Please confirm tube placement before using the Cortrak tube.   If the tube becomes dislodged please keep the tube and contact the Cortrak team at [email or web address] (password [email or web address]) for replacement.  If after hours and replacement cannot be delayed, place a NG tube and confirm placement with an abdominal x-ray.    Eugene Gavia, MS, RD, LDN (she/her/hers) RD pager number and weekend/on-call pager number located in Amion.

## 2020-12-29 NOTE — Evaluation (Signed)
SLP Cancellation Note  Patient Details Name: Christian Hartman MRN: [id number] DOB: [birth year]   Cancelled treatment:         Pt has NG in place at this time and await Cortrak team per RN - will defer swallow evaluation until pt cleared.  Thank you.   Chales Abrahams 12/29/2020, 9:01 AM

## 2020-12-29 NOTE — Progress Notes (Signed)
Inpatient Rehab Admissions Coordinator:   Pt. Re-screened by for CIR candidacy. Pt. Is not medically ready for CIR at this time and does not currently appear able to tolerate intensity of CIR. Pt. Is likely progress to being a potential CIR candidate as be becomes more medically stable. AC team to follow for progress and participation with therapy and place consult order if Pt. Appears to be an appropriate candidate.   Megan Salon, MS, CCC-SLP Rehab Admissions Coordinator  [phone] (celll) [phone] (office)

## 2020-12-29 NOTE — Progress Notes (Signed)
Pt with 2 episodes of emesis. TF were restarted at 1530 at 25 with goal to increase by 10 q8h after cortrak was moved to post-pyloric d/t nausea and vomiting. TF turned off. Zofran given. Dr. Dwain Sarna notified and stated to keep TF on hold for the night. Pt transferred to [facility] and RN aware of these changes.

## 2020-12-30 LAB — CBC
HCT: 25.3 % — ABNORMAL LOW (ref 39.0–52.0)
Hemoglobin: 8.1 g/dL — ABNORMAL LOW (ref 13.0–17.0)
MCH: 30.3 pg (ref 26.0–34.0)
MCHC: 32 g/dL (ref 30.0–36.0)
MCV: 94.8 fL (ref 80.0–100.0)
Platelets: 595 10*3/uL — ABNORMAL HIGH (ref 150–400)
RBC: 2.67 MIL/uL — ABNORMAL LOW (ref 4.22–5.81)
RDW: 17.2 % — ABNORMAL HIGH (ref 11.5–15.5)
WBC: 16.4 10*3/uL — ABNORMAL HIGH (ref 4.0–10.5)
nRBC: 0.4 % — ABNORMAL HIGH (ref 0.0–0.2)

## 2020-12-30 LAB — GLUCOSE, CAPILLARY
Glucose-Capillary: 108 mg/dL — ABNORMAL HIGH (ref 70–99)
Glucose-Capillary: 108 mg/dL — ABNORMAL HIGH (ref 70–99)
Glucose-Capillary: 110 mg/dL — ABNORMAL HIGH (ref 70–99)
Glucose-Capillary: 99 mg/dL (ref 70–99)

## 2020-12-30 LAB — BASIC METABOLIC PANEL
Anion gap: 7 (ref 5–15)
BUN: 19 mg/dL (ref 6–20)
CO2: 24 mmol/L (ref 22–32)
Calcium: 8.7 mg/dL — ABNORMAL LOW (ref 8.9–10.3)
Chloride: 106 mmol/L (ref 98–111)
Creatinine, Ser: 0.9 mg/dL (ref 0.61–1.24)
GFR, Estimated: >60 mL/min (ref >60)
Glucose, Bld: 115 mg/dL — ABNORMAL HIGH (ref 70–99)
Potassium: 3.3 mmol/L — ABNORMAL LOW (ref 3.5–5.1)
Sodium: 137 mmol/L (ref 135–145)

## 2020-12-30 MED ORDER — ACETAMINOPHEN 500 MG PO TABS
1000.0000 mg | ORAL_TABLET | Freq: Four times a day (QID) | ORAL | Status: DC
  Administered 2020-12-30: 1000 mg via ORAL
  Filled 2020-12-30: qty 2

## 2020-12-30 MED ORDER — QUETIAPINE FUMARATE 50 MG PO TABS
50.0000 mg | ORAL_TABLET | Freq: Two times a day (BID) | ORAL | Status: DC
  Administered 2020-12-30: 50 mg via ORAL
  Filled 2020-12-30: qty 1

## 2020-12-30 MED ORDER — OXYCODONE HCL 5 MG PO TABS: 5.0000 mg | ORAL_TABLET | ORAL | Status: DC | PRN

## 2020-12-30 MED ORDER — PANTOPRAZOLE SODIUM 40 MG PO TBEC
40.0000 mg | DELAYED_RELEASE_TABLET | Freq: Every day | ORAL | Status: DC
  Administered 2020-12-30: 40 mg via ORAL
  Filled 2020-12-30: qty 1

## 2020-12-30 MED ORDER — CLONAZEPAM 0.5 MG PO TABS
0.5000 mg | ORAL_TABLET | Freq: Two times a day (BID) | ORAL | Status: DC
  Administered 2020-12-30: 0.5 mg via ORAL
  Filled 2020-12-30: qty 1

## 2020-12-30 MED ORDER — FOLIC ACID 1 MG PO TABS
1.0000 mg | ORAL_TABLET | Freq: Every day | ORAL | Status: DC
  Administered 2020-12-30 (×2): 1 mg via ORAL
  Filled 2020-12-30: qty 1

## 2020-12-30 MED ORDER — MAGNESIUM HYDROXIDE 400 MG/5ML PO SUSP: 30.0000 mL | Freq: Every day | ORAL | Status: DC | PRN

## 2020-12-30 MED ORDER — POTASSIUM CHLORIDE 20 MEQ PO PACK
40.0000 meq | PACK | Freq: Two times a day (BID) | ORAL | Status: AC
  Administered 2020-12-30: 40 meq via ORAL
  Filled 2020-12-30: qty 2

## 2020-12-30 MED ORDER — ALUM & MAG HYDROXIDE-SIMETH 200-200-20 MG/5ML PO SUSP: 15.0000 mL | ORAL | Status: DC | PRN

## 2020-12-30 MED ORDER — LORAZEPAM 2 MG/ML IJ SOLN
0.5000 mg | Freq: Four times a day (QID) | INTRAMUSCULAR | Status: DC | PRN
  Filled 2020-12-30: qty 1

## 2020-12-30 MED ORDER — POTASSIUM CHLORIDE 20 MEQ PO PACK: 40.0000 meq | PACK | Freq: Two times a day (BID) | ORAL | Status: DC

## 2020-12-30 MED ORDER — ADULT MULTIVITAMIN LIQUID CH: 15.0000 mL | Freq: Every day | ORAL | Status: DC

## 2020-12-30 MED ORDER — ENSURE ENLIVE PO LIQD: 237.0000 mL | Freq: Three times a day (TID) | ORAL | Status: DC

## 2020-12-30 MED ORDER — THIAMINE HCL 100 MG PO TABS
100.0000 mg | ORAL_TABLET | Freq: Every day | ORAL | Status: DC
  Administered 2020-12-30: 100 mg via ORAL
  Filled 2020-12-30: qty 1

## 2020-12-30 MED ORDER — ASPIRIN 81 MG PO CHEW
81.0000 mg | CHEWABLE_TABLET | Freq: Every day | ORAL | Status: DC
  Administered 2020-12-30: 81 mg via ORAL
  Filled 2020-12-30: qty 1
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
## 2020-12-30 NOTE — Progress Notes (Signed)
Pt HIGH FALL RISK d/t fall overnight
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
## 2020-12-30 NOTE — Progress Notes (Addendum)
Patient transerred from 4N to 6N03 . AY0K5. No acute distress noted.  TF was held prior transfer d/t N/V. Denies any pain at this time. See assessment details. Patient was oriented to call bell and surroundings. Call bell within reach and will continue to monitor.

## 2020-12-30 NOTE — Progress Notes (Signed)
Pt refusing to stay and signed AMA paper. I discontinued the prevena and cleansed the stump area with saline and applied a gauze dressing, wrapped with kerlix and an ace bandage and applied the black stump shrinker. The staples were intact and the area is without any odor or drainage. Mother came and pt left with the mother in her car.
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
## 2020-12-30 NOTE — Progress Notes (Signed)
Occupational Therapy Treatment Patient Details Name: Christian Hartman MRN: [id number] DOB: 03-May-1991 Today's Date: 12/30/2020    History of present illness Pt is a 30 yo male presented 12/16/20 s/p [facility] and sustained a traumatic R tibial amputation, grade 3-4 liver lac, aortic transection, s/p TEVAR, and L PTX. 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 pt extubated on 7/25. PMH and PSH: unremarkable.   OT comments  Pt received awake and alert, seated in recliner, agreeable to session with fiance at bedside. Initially, session was to address HEP with handout and demonstration, however, pt requiring toileting. It was reported of fall this AM, pt educated of safety awareness and importance to ask for assistance. While OT is setting up for [facility] transfer and positioning for lines/wound vac, pt observed to stand impulsively. Pt reminded to wait for assistance. Mod A stand pivot to [facility] with heavy cues for hand placement and sequencing. Pt educated of compensatory tech of side leaning for pericare/toilet hygiene. Pt will require additional practice and education in safety awareness. Next session to address sensation and HEP. DC recommendation and frequency remains the same.   Follow Up Recommendations  CIR    Equipment Recommendations  Wheelchair (measurements OT);Wheelchair cushion (measurements OT);Hospital bed;3 in 1 bedside commode    Recommendations for Other Services Rehab consult    Precautions / Restrictions Precautions Precautions: Fall Precaution Comments: R BKA Required Braces or Orthoses: Other Brace Other Brace: limb protector on the R Restrictions Weight Bearing Restrictions: Yes RLE Weight Bearing: Non weight bearing       Mobility Bed Mobility Overal bed mobility: Needs Assistance             General bed mobility comments: Pt received sitting up in recliner with visitor.    Transfers Overall transfer level: Needs assistance   Transfers: Sit to/from Stand Sit to Stand: Min  assist         General transfer comment: heavy cues for safety with hand placement and waiting for placement of lines and wound vac.    Balance Overall balance assessment: Needs assistance Sitting-balance support: Bilateral upper extremity supported;Feet supported Sitting balance-Leahy Scale: Fair Sitting balance - Comments: pt requiring maxA to maintain EOB balance, pt unable to hold head up >5 sec, pt with tendency to fall forward, pt with bilat UE weakness and unable to support self to maintain position   Standing balance support: Bilateral upper extremity supported Standing balance-Leahy Scale: Poor Standing balance comment: heavy reliance of UE with RW.                           ADL either performed or assessed with clinical judgement   ADL Overall ADL's : Needs assistance/impaired     Grooming: Set up;Sitting Grooming Details (indicate cue type and reason): given hand sanitizer after toileting                 Toilet Transfer: Moderate assistance;Cueing for safety;Cueing for sequencing;Stand-pivot Toilet Transfer Details (indicate cue type and reason): heavy cueing for safety. Pt is impulsive and attempted to stand before OT could set up for [facility] transfer and management of lines and wound vac. Heavy cueing for hand placement for stand to sit transition to reach back for seated surface prior to sitting. Toileting- Clothing Manipulation and Hygiene: Minimal assistance Toileting - Clothing Manipulation Details (indicate cue type and reason): Pt educated to lateral lean for toilet hygiene as a compensatory strategy for safety. OT assisted with more toilet  hygiene with wet cloth while pt stood with RW.       General ADL Comments: Pt is unsafe and eager to move. Chart review reported fall this AM. Educated pt on safety in standing and having assistance for transfers.     Vision       Perception     Praxis      Cognition Arousal/Alertness:  Awake/alert Behavior During Therapy: Flat affect Overall Cognitive Status: Impaired/Different from baseline Area of Impairment: Orientation;Attention;Memory;Following commands;Safety/judgement;Awareness;Problem solving                 Orientation Level: Disoriented to;Time (reports its December 25 2000) Current Attention Level: Sustained Memory: Decreased recall of precautions;Decreased short-term memory Following Commands: Follows one step commands consistently Safety/Judgement: Decreased awareness of deficits Awareness: Emergent Problem Solving: Slow processing;Difficulty sequencing General Comments: pt eager to get up with out assistance. Really impulsive requireing several cues for safety and sequencing transitions.        Exercises Exercises: General Upper Extremity (Pt given HEP handout with level 1&2 theraband. Demonstration limited due to toileting and RN assisting to remove NG tube.)   Shoulder Instructions       General Comments limb protector positioned appropriately    Pertinent Vitals/ Pain       Pain Assessment: No/denies pain Faces Pain Scale: No hurt  Home Living                                          Prior Functioning/Environment              Frequency  Min 2X/week        Progress Toward Goals  OT Goals(current goals can now be found in the care plan section)  Progress towards OT goals: Progressing toward goals  Acute Rehab OT Goals Patient Stated Goal: to get tube from nose OT Goal Formulation: Patient unable to participate in goal setting Time For Goal Achievement: 01/12/21 Potential to Achieve Goals: Good ADL Goals Pt Will Perform Grooming: with min guard assist;sitting Pt Will Perform Upper Body Bathing: with min assist;sitting Pt Will Perform Upper Body Dressing: with min assist;sitting Pt Will Transfer to Toilet: with +2 assist;with mod assist;squat pivot transfer;bedside commode Additional ADL Goal #1: pt will  complete bed mobility min (A) as precursor for adls and basic transfers Additional ADL Goal #2: pt will demonstrate don doff limb protector mod (A) Additional ADL Goal #3: pt will complete R LE exercises program min (A) with written hand out x3 times per day  Plan Discharge plan remains appropriate;Frequency remains appropriate    Co-evaluation                 AM-PAC OT "6 Clicks" Daily Activity     Outcome Measure   Help from another person eating meals?: A Little Help from another person taking care of personal grooming?: A Lot Help from another person toileting, which includes using toliet, bedpan, or urinal?: A Little Help from another person bathing (including washing, rinsing, drying)?: A Lot Help from another person to put on and taking off regular upper body clothing?: A Lot Help from another person to put on and taking off regular lower body clothing?: A Lot 6 Click Score: 14    End of Session Equipment Utilized During Treatment: Gait belt;Rolling walker  OT Visit Diagnosis: Unsteadiness on feet (R26.81);Ataxia, unspecified (R27.0)   Activity Tolerance Patient  tolerated treatment well   Patient Left in chair;with call bell/phone within reach;with chair alarm set;with family/visitor present;with nursing/sitter in room   Nurse Communication Mobility status;Precautions        Time: 1610-9604 OT Time Calculation (min): 20 min  Charges: OT General Charges $OT Visit: 1 Visit OT Treatments $Self Care/Home Management : 8-22 mins  Marquette Old, MSOT, OTR/L  Supplemental Rehabilitation Services  [phone]    Zigmund Daniel 12/30/2020, 1:16 PM

## 2020-12-30 NOTE — Progress Notes (Signed)
Patient asking something for sleep. Paged M. Dwain Sarna, MD. Called back and no new orders received.  Will continue to monitor patient.
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
## 2020-12-30 NOTE — Progress Notes (Signed)
   12/30/20 0235  What Happened  Was fall witnessed? No  Was patient injured? No  Patient found on floor  Found by  [facility] RN, [employer])  Stated prior activity other (comment) (trying to reach walker)  Follow Up  MD notified Tera Helper  Time MD notified [phone]  Family notified Yes - comment Elmo Putt (mother))  Time family notified 62  Adult Fall Risk Assessment  Risk Factor Category (scoring not indicated) Fall has occurred during this admission (document High fall risk)  Age 30  Fall History: Fall within 6 months prior to admission 0  Elimination; Bowel and/or Urine Incontinence 2  Elimination; Bowel and/or Urine Urgency/Frequency 0  Medications: includes PCA/Opiates, Anti-convulsants, Anti-hypertensives, Diuretics, Hypnotics, Laxatives, Sedatives, and Psychotropics 5  Patient Care Equipment 3  Mobility-Assistance 2  Mobility-Gait 2  Mobility-Sensory Deficit 0  Altered awareness of immediate physical environment 1  Impulsiveness 2  Lack of understanding of one's physical/cognitive limitations 4  Total Score 21  Patient Fall Risk Level High fall risk  Adult Fall Risk Interventions  Required Bundle Interventions *See Row Information* High fall risk - low, moderate, and high requirements implemented  Additional Interventions Room near nurses station;PT/OT need assessed if change in mobility from baseline;Use of appropriate toileting equipment (bedpan, BSC, etc.)  Screening for Fall Injury Risk (To be completed on HIGH fall risk patients) - Assessing Need for Floor Mats  Risk For Fall Injury- Criteria for Floor Mats Previous fall this admission  Will Implement Floor Mats Yes  Pain Assessment  Pain Scale 0-10  Pain Score 0  Neurological  Level of Consciousness Alert  Orientation Level Oriented to person;Oriented to place;Oriented to situation;Disoriented to time  Cognition Poor safety awareness;Impulsive;Follows commands  Speech Clear  Glasgow Coma Scale  Eye  Opening 4  Best Verbal Response (NON-intubated) 5  Best Motor Response 6  Glasgow Coma Scale Score 15      Patient noted on the floor on a sitting position. When asked what was he doing prior fall, He stated he was trying to reach over to get the walker and doesn't want to bother anybody. Reminded/advised patient to call for assistance when needed. Upon assessment, no injury noted and patient denies hitting his head VSS BP119/66 PR59 T99.3 RR16 97%room air. Denies any pain at this moment. RP Mother(Dana Julian Reil) was called and notified. Ulla Potash paged and notified twice. Fall precautions in place.  Call bell within reach and will continue to monitor.

## 2020-12-30 NOTE — Progress Notes (Signed)
Trauma/Critical Care Follow Up Note  Subjective:    Overnight Issues: feel overnight trying to get OOB to void.  Having BMs, some loose.  Nausea seems to be better, but still present at times.  No vomiting.  Eating more liquid stuff during the day and nervous about solid food.   Objective:  Vital signs for last 24 hours: Temp:  [98.7 F (37.1 C)-100.8 F (38.2 C)] 98.7 F (37.1 C) (07/28 0807) Pulse Rate:  [54-80] 54 (07/28 0807) Resp:  [16-35] 18 (07/28 0807) BP: (90-125)/(60-82) 125/65 (07/28 0807) SpO2:  [94 %-100 %] 99 % (07/28 0807) Weight:  [86.7 kg] 86.7 kg (07/28 0500)  Hemodynamic parameters for last 24 hours:    Intake/Output from previous day: 07/27 0701 - 07/28 0700 In: 1210.9 [I.V.:1101.3; NG/GT:9.6; IV Piggyback:100.1] Out: 400 [Urine:400]  Intake/Output this shift: Total I/O In: -  Out: 75 [Urine:75]  Vent settings for last 24 hours:    Physical Exam:  Gen: comfortable, no distress Neuro: non-focal exam, but very unsteady on his foot when trying to stand up with walker. HEENT: PERRL, Cortrak in left nare Neck: supple CV: RRR Pulm: unlabored breathing Abd: soft, NT GU: clear yellow urine in bedside urinal Extr: amputation brace in place on RLE.  No edema of LLE.     Results for orders placed or performed during the hospital encounter of 12/16/20 (from the past 24 hour(s))  Glucose, capillary     Status: Abnormal   Collection Time: 12/29/20 11:28 AM  Result Value Ref Range   Glucose-Capillary 109 (H) 70 - 99 mg/dL  Glucose, capillary     Status: None   Collection Time: 12/29/20  3:33 PM  Result Value Ref Range   Glucose-Capillary 99 70 - 99 mg/dL  Glucose, capillary     Status: Abnormal   Collection Time: 12/29/20  7:34 PM  Result Value Ref Range   Glucose-Capillary 109 (H) 70 - 99 mg/dL  CBC     Status: Abnormal   Collection Time: 12/30/20 12:18 AM  Result Value Ref Range   WBC 16.4 (H) 4.0 - 10.5 K/uL   RBC 2.67 (L) 4.22 - 5.81 MIL/uL    Hemoglobin 8.1 (L) 13.0 - 17.0 g/dL   HCT 96.2 (L) 95.2 - 84.1 %   MCV 94.8 80.0 - 100.0 fL   MCH 30.3 26.0 - 34.0 pg   MCHC 32.0 30.0 - 36.0 g/dL   RDW 32.4 (H) 40.1 - 02.7 %   Platelets 595 (H) 150 - 400 K/uL   nRBC 0.4 (H) 0.0 - 0.2 %  Basic metabolic panel     Status: Abnormal   Collection Time: 12/30/20 12:18 AM  Result Value Ref Range   Sodium 137 135 - 145 mmol/L   Potassium 3.3 (L) 3.5 - 5.1 mmol/L   Chloride 106 98 - 111 mmol/L   CO2 24 22 - 32 mmol/L   Glucose, Bld 115 (H) 70 - 99 mg/dL   BUN 19 6 - 20 mg/dL   Creatinine, Ser 2.53 0.61 - 1.24 mg/dL   Calcium 8.7 (L) 8.9 - 10.3 mg/dL   GFR, Estimated >66 >44 mL/min   Anion gap 7 5 - 15  Glucose, capillary     Status: Abnormal   Collection Time: 12/30/20 12:27 AM  Result Value Ref Range   Glucose-Capillary 108 (H) 70 - 99 mg/dL  Glucose, capillary     Status: None   Collection Time: 12/30/20  3:58 AM  Result Value Ref Range  Glucose-Capillary 99 70 - 99 mg/dL  Glucose, capillary     Status: Abnormal   Collection Time: 12/30/20  8:08 AM  Result Value Ref Range   Glucose-Capillary 110 (H) 70 - 99 mg/dL     LOS: 14 days    [facility] 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 Aundria Rud 7/14, revision to transtibial amputation 7/20 with Dr. Lajoyce Corners Occult L PTX - resolved Bilateral pleural effusions - diuresed, cont pulm toilet Acute hypoxic ventilator dependent respiratory failure - extubated 7/25, refractory n/v and emesis improving.  Still with nausea but no emesis. send resp cx, but unable to get at this point. Delirium/agitation - resolved Refractory n/v - improved, zofran/phenergan, adv cortrak PP, scop patch, reglan 10 TID.  Try to encourage PO intake as able Aortic transection with associated dissection flap - S/P TEVAR by Dr. Lenell Antu 7/14, ASA81 start 7/25, follow-up in 1 month with CTA of the chest G3 liver laceration - repeat CT A/P 7/17 due to drop in hgb, slight increase in hemoperitoneum but no  active source of bleeding--presumed liver source. CTA 3x phase 7/22 without PSA FEN - cortrak adv PP, restart tube feeds for night time only.  Regular diet otherwise. ABL anemia - stable  DVT - SCDs, LMWH Dispo - med-surg, PT/OT/SLP, recommending CIR   Letha Cape, PA-C Trauma & General Surgery Please use [email or web address] to contact on call provider  12/30/2020
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
## 2020-12-30 NOTE — Progress Notes (Signed)
IP rehab admissions - I met with patient today.  He prefers to discharge directly home with fiance and not admit to CIR.  He has no insurance and is concerned about costs.  I gave him rehab booklets and I will follow up with him and his girlfriend/fiance in am.  Call for questions.  [phone]

## 2020-12-31 ENCOUNTER — Encounter (HOSPITAL_COMMUNITY): Payer: Self-pay | Admitting: Emergency Medicine
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
## 2021-01-05 ENCOUNTER — Ambulatory Visit (INDEPENDENT_AMBULATORY_CARE_PROVIDER_SITE_OTHER): Payer: Self-pay | Admitting: Family

## 2021-01-05 ENCOUNTER — Encounter: Payer: Self-pay | Admitting: Family

## 2021-01-05 ENCOUNTER — Other Ambulatory Visit: Payer: Self-pay

## 2021-01-05 DIAGNOSIS — Z89511 Acquired absence of right leg below knee: Secondary | ICD-10-CM
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
## 2021-01-07 ENCOUNTER — Ambulatory Visit
Admission: RE | Admit: 2021-01-07 | Discharge: 2021-01-07 | Disposition: A | Discharge status: Home / Self Care | Payer: No Typology Code available for payment source | Source: Ambulatory Visit | Attending: Vascular Surgery | Admitting: Vascular Surgery

## 2021-01-07 MED ORDER — IOPAMIDOL (ISOVUE-370) INJECTION 76%
75.0000 mL | Freq: Once | INTRAVENOUS | Status: AC | PRN
  Administered 2021-01-07: 75 mL via INTRAVENOUS
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
## 2021-01-18 ENCOUNTER — Ambulatory Visit (INDEPENDENT_AMBULATORY_CARE_PROVIDER_SITE_OTHER): Payer: Self-pay | Admitting: Vascular Surgery

## 2021-01-18 ENCOUNTER — Other Ambulatory Visit: Payer: Self-pay

## 2021-01-18 ENCOUNTER — Encounter: Payer: Self-pay | Admitting: Vascular Surgery

## 2021-01-18 VITALS — BP 120/87 | HR 100 | Temp 99.3°F | Resp 20 | Ht 70.0 in | Wt 190.0 lb

## 2021-01-18 DIAGNOSIS — Z95828 Presence of other vascular implants and grafts: Secondary | ICD-10-CM

## 2021-01-19 ENCOUNTER — Ambulatory Visit (INDEPENDENT_AMBULATORY_CARE_PROVIDER_SITE_OTHER): Payer: Self-pay | Admitting: Family

## 2021-01-19 ENCOUNTER — Encounter: Payer: Self-pay | Admitting: Family

## 2021-01-19 DIAGNOSIS — Z89511 Acquired absence of right leg below knee: Secondary | ICD-10-CM
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
## 2021-01-31 ENCOUNTER — Ambulatory Visit (INDEPENDENT_AMBULATORY_CARE_PROVIDER_SITE_OTHER): Payer: Self-pay | Admitting: Physician Assistant

## 2021-01-31 ENCOUNTER — Encounter: Payer: Self-pay | Admitting: Orthopedic Surgery

## 2021-01-31 VITALS — Ht 70.0 in | Wt 190.0 lb

## 2021-01-31 DIAGNOSIS — Z89511 Acquired absence of right leg below knee: Secondary | ICD-10-CM

## 2021-01-31 MED ORDER — PREGABALIN 75 MG PO CAPS: 75.0000 mg | ORAL_CAPSULE | Freq: Two times a day (BID) | ORAL | 0 refills | Status: DC
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
## 2021-02-09 ENCOUNTER — Encounter: Payer: No Typology Code available for payment source | Admitting: Family

## 2021-02-15 ENCOUNTER — Ambulatory Visit (INDEPENDENT_AMBULATORY_CARE_PROVIDER_SITE_OTHER): Payer: Self-pay | Admitting: Orthopedic Surgery

## 2021-02-15 DIAGNOSIS — Z89511 Acquired absence of right leg below knee: Secondary | ICD-10-CM

## 2021-02-22 ENCOUNTER — Encounter: Payer: Self-pay | Admitting: Orthopedic Surgery
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
## 2021-02-28 ENCOUNTER — Ambulatory Visit (INDEPENDENT_AMBULATORY_CARE_PROVIDER_SITE_OTHER): Payer: No Payment, Other | Admitting: Psychiatry

## 2021-02-28 ENCOUNTER — Encounter (HOSPITAL_COMMUNITY): Payer: Self-pay

## 2021-02-28 ENCOUNTER — Other Ambulatory Visit: Payer: Self-pay

## 2021-02-28 VITALS — BP 120/87 | HR 100 | Ht 70.0 in | Wt 190.0 lb

## 2021-02-28 DIAGNOSIS — F331 Major depressive disorder, recurrent, moderate: Secondary | ICD-10-CM | POA: Diagnosis not present
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
## 2021-03-15 ENCOUNTER — Encounter: Payer: Self-pay | Admitting: Orthopedic Surgery

## 2021-04-18 ENCOUNTER — Ambulatory Visit (INDEPENDENT_AMBULATORY_CARE_PROVIDER_SITE_OTHER): Payer: Self-pay | Admitting: Orthopedic Surgery

## 2021-04-18 DIAGNOSIS — Z89511 Acquired absence of right leg below knee: Secondary | ICD-10-CM

## 2021-05-25 ENCOUNTER — Encounter: Payer: Self-pay | Admitting: Orthopedic Surgery
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
## 2021-06-10 ENCOUNTER — Encounter (HOSPITAL_COMMUNITY): Payer: Self-pay

## 2021-06-10 ENCOUNTER — Other Ambulatory Visit: Payer: Self-pay

## 2021-06-10 ENCOUNTER — Emergency Department (HOSPITAL_COMMUNITY)
Admission: EM | Admit: 2021-06-10 | Discharge: 2021-06-17 | Disposition: A | Discharge status: Home / Self Care | Payer: Self-pay | Attending: Emergency Medicine | Admitting: Emergency Medicine

## 2021-06-10 DIAGNOSIS — F332 Major depressive disorder, recurrent severe without psychotic features: Secondary | ICD-10-CM | POA: Insufficient documentation

## 2021-06-10 DIAGNOSIS — F1020 Alcohol dependence, uncomplicated: Secondary | ICD-10-CM | POA: Insufficient documentation

## 2021-06-10 DIAGNOSIS — F1220 Cannabis dependence, uncomplicated: Secondary | ICD-10-CM | POA: Diagnosis present

## 2021-06-10 DIAGNOSIS — Z20822 Contact with and (suspected) exposure to covid-19: Secondary | ICD-10-CM | POA: Insufficient documentation

## 2021-06-10 DIAGNOSIS — F6381 Intermittent explosive disorder: Secondary | ICD-10-CM | POA: Insufficient documentation

## 2021-06-10 DIAGNOSIS — R4587 Impulsiveness: Secondary | ICD-10-CM | POA: Diagnosis present

## 2021-06-10 DIAGNOSIS — F4323 Adjustment disorder with mixed anxiety and depressed mood: Secondary | ICD-10-CM

## 2021-06-10 DIAGNOSIS — Z046 Encounter for general psychiatric examination, requested by authority: Secondary | ICD-10-CM

## 2021-06-10 DIAGNOSIS — R45851 Suicidal ideations: Secondary | ICD-10-CM | POA: Insufficient documentation

## 2021-06-10 LAB — RAPID URINE DRUG SCREEN, HOSP PERFORMED
Amphetamines: NOT DETECTED
Barbiturates: NOT DETECTED
Benzodiazepines: NOT DETECTED
Cocaine: NOT DETECTED
Opiates: NOT DETECTED
Tetrahydrocannabinol: POSITIVE — AB

## 2021-06-10 LAB — ACETAMINOPHEN LEVEL: Acetaminophen (Tylenol), Serum: <10 ug/mL — ABNORMAL LOW (ref 10–30)

## 2021-06-10 LAB — RESP PANEL BY RT-PCR (FLU A&B, COVID) ARPGX2
Influenza A by PCR: NEGATIVE
Influenza B by PCR: NEGATIVE
SARS Coronavirus 2 by RT PCR: NEGATIVE

## 2021-06-10 LAB — CBC WITH DIFFERENTIAL/PLATELET
Abs Immature Granulocytes: 0.05 10*3/uL (ref 0.00–0.07)
Basophils Absolute: 0.1 10*3/uL (ref 0.0–0.1)
Basophils Relative: 1 %
Eosinophils Absolute: 0.5 10*3/uL (ref 0.0–0.5)
Eosinophils Relative: 4 %
HCT: 45.1 % (ref 39.0–52.0)
Hemoglobin: 15 g/dL (ref 13.0–17.0)
Immature Granulocytes: 1 %
Lymphocytes Relative: 44 %
Lymphs Abs: 4.7 10*3/uL — ABNORMAL HIGH (ref 0.7–4.0)
MCH: 32.6 pg (ref 26.0–34.0)
MCHC: 33.3 g/dL (ref 30.0–36.0)
MCV: 98 fL (ref 80.0–100.0)
Monocytes Absolute: 0.6 10*3/uL (ref 0.1–1.0)
Monocytes Relative: 6 %
Neutro Abs: 4.9 10*3/uL (ref 1.7–7.7)
Neutrophils Relative %: 44 %
Platelets: 312 10*3/uL (ref 150–400)
RBC: 4.6 MIL/uL (ref 4.22–5.81)
RDW: 12 % (ref 11.5–15.5)
WBC: 10.8 10*3/uL — ABNORMAL HIGH (ref 4.0–10.5)
nRBC: 0 % (ref 0.0–0.2)

## 2021-06-10 LAB — COMPREHENSIVE METABOLIC PANEL
ALT: 16 U/L (ref 0–44)
AST: 18 U/L (ref 15–41)
Albumin: 4.3 g/dL (ref 3.5–5.0)
Alkaline Phosphatase: 79 U/L (ref 38–126)
Anion gap: 11 (ref 5–15)
BUN: 7 mg/dL (ref 6–20)
CO2: 24 mmol/L (ref 22–32)
Calcium: 9.2 mg/dL (ref 8.9–10.3)
Chloride: 102 mmol/L (ref 98–111)
Creatinine, Ser: 1.08 mg/dL (ref 0.61–1.24)
GFR, Estimated: >60 mL/min (ref >60)
Glucose, Bld: 103 mg/dL — ABNORMAL HIGH (ref 70–99)
Potassium: 3.2 mmol/L — ABNORMAL LOW (ref 3.5–5.1)
Sodium: 137 mmol/L (ref 135–145)
Total Bilirubin: 0.5 mg/dL (ref 0.3–1.2)
Total Protein: 7.7 g/dL (ref 6.5–8.1)

## 2021-06-10 LAB — SALICYLATE LEVEL: Salicylate Lvl: <7 mg/dL — ABNORMAL LOW (ref 7.0–30.0)

## 2021-06-10 LAB — ETHANOL: Alcohol, Ethyl (B): 19 mg/dL — ABNORMAL HIGH (ref <10)

## 2021-06-10 MED ORDER — LORAZEPAM 2 MG/ML IJ SOLN
2.0000 mg | Freq: Once | INTRAMUSCULAR | Status: AC
Start: 2021-06-10 — End: 2021-06-10
  Administered 2021-06-10: 2 mg via INTRAMUSCULAR
  Filled 2021-06-10: qty 1

## 2021-06-10 MED ORDER — ZIPRASIDONE MESYLATE 20 MG IM SOLR
20.0000 mg | Freq: Once | INTRAMUSCULAR | Status: AC
  Administered 2021-06-10: 20 mg via INTRAMUSCULAR
  Filled 2021-06-10: qty 20

## 2021-06-10 MED ORDER — STERILE WATER FOR INJECTION IJ SOLN
INTRAMUSCULAR | Status: AC
  Administered 2021-06-10: 1.2 mL
  Filled 2021-06-10: qty 10
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
## 2021-06-10 NOTE — ED Notes (Signed)
Pt in restroom changing into purple scrubs at this time.
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
## 2021-06-11 MED ORDER — ZIPRASIDONE MESYLATE 20 MG IM SOLR: 20.0000 mg | Freq: Once | INTRAMUSCULAR | Status: DC

## 2021-06-11 MED ORDER — CITALOPRAM HYDROBROMIDE 10 MG PO TABS
20.0000 mg | ORAL_TABLET | Freq: Every day | ORAL | Status: DC
  Administered 2021-06-11 – 2021-06-17 (×7): 20 mg via ORAL
  Filled 2021-06-11 (×7): qty 2

## 2021-06-11 MED ORDER — HYDROXYZINE HCL 25 MG PO TABS
25.0000 mg | ORAL_TABLET | Freq: Three times a day (TID) | ORAL | Status: DC | PRN
  Administered 2021-06-11 – 2021-06-17 (×8): 25 mg via ORAL
  Filled 2021-06-11 (×8): qty 1

## 2021-06-11 MED ORDER — PREGABALIN 75 MG PO CAPS
75.0000 mg | ORAL_CAPSULE | Freq: Two times a day (BID) | ORAL | Status: DC
  Administered 2021-06-11 – 2021-06-17 (×11): 75 mg via ORAL
  Filled 2021-06-11: qty 3
  Filled 2021-06-11 (×10): qty 1

## 2021-06-11 MED ORDER — POTASSIUM CHLORIDE CRYS ER 20 MEQ PO TBCR
10.0000 meq | EXTENDED_RELEASE_TABLET | Freq: Every day | ORAL | Status: DC
  Administered 2021-06-12 – 2021-06-13 (×2): 10 meq via ORAL
  Filled 2021-06-11 (×2): qty 1

## 2021-06-11 MED ORDER — QUETIAPINE FUMARATE 50 MG PO TABS
50.0000 mg | ORAL_TABLET | Freq: Every day | ORAL | Status: DC
  Administered 2021-06-12 – 2021-06-16 (×4): 50 mg via ORAL
  Filled 2021-06-11 (×5): qty 1

## 2021-06-11 MED ORDER — STERILE WATER FOR INJECTION IJ SOLN
INTRAMUSCULAR | Status: AC
  Filled 2021-06-11: qty 10

## 2021-06-11 MED ORDER — BUPROPION HCL 100 MG PO TABS
100.0000 mg | ORAL_TABLET | Freq: Every day | ORAL | Status: DC
  Administered 2021-06-11 – 2021-06-17 (×7): 100 mg via ORAL
  Filled 2021-06-11 (×9): qty 1

## 2021-06-11 MED ORDER — ZIPRASIDONE MESYLATE 20 MG IM SOLR
INTRAMUSCULAR | Status: AC
  Administered 2021-06-11: 20 mg via INTRAMUSCULAR
  Filled 2021-06-11: qty 20

## 2021-06-11 NOTE — ED Notes (Signed)
Pt returned to H bed after TTS completed
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
## 2021-06-11 NOTE — Progress Notes (Signed)
Per Ene Ajibola,NP, patient meets criteria for inpatient treatment. There are no available or appropriate beds at [facility] today. CSW faxed referrals to the following facilities for review: ° °[facility]  Pending - No Request Sent N/A [address] [place] [address] [phone] --  °[facility]  Pending - No Request Sent N/A [address] [place] [address] [phone] --  °[facility]  Pending - No Request Sent N/A [address] [place] [address] [phone] --  °[facility]  Pending - No Request Sent N/A [address], [place] [address] [phone] --  °[facility]  Pending - No Request Sent N/A [address], [place] [address] [phone] --  °[facility]  Pending - No Request Sent N/A [address] [place] [address] [phone] --  °[facility]  Pending - No Request Sent N/A [address] [place] [address] [phone] --  °[facility]  Pending - No Request Sent N/A [address] [place] [address] [phone] --  °[facility]  Pending - No Request Sent N/A [address] [place] [address] [phone] --  °[facility]  Pending - No Request Sent N/A [address], [place] [address] [phone] --  °[facility]  Pending - No Request Sent N/A [address], [place] [address] [phone] --  °[facility]  Pending - No Request Sent N/A [address] [place] [address] [phone] --  °[facility]  Pending - No Request Sent N/A [address], [place] [address] [phone] --   °[facility]  Pending - No Request Sent N/A [address], [place] [address] [phone] --  ° ° °TTS will continue to seek bed placement. ° °Rozetta Stumpp, MSW, LCSW-A, LCAS-A °Phone: [phone] °Disposition/TOC ° °
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
## 2021-06-11 NOTE — ED Notes (Signed)
Pt moved to room 46 to complete TTS at this time

## 2021-06-11 NOTE — ED Notes (Addendum)
Pt sitting up in stretcher. Restraints not on at this time. Sitter at [employer].  Pt asking for something to help him sleep and is calm and cooperative at this time.  Meds given and VS checked. Pt lying down now
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
## 2021-06-12 LAB — CBG MONITORING, ED: Glucose-Capillary: 104 mg/dL — ABNORMAL HIGH (ref 70–99)

## 2021-06-12 MED ORDER — ONDANSETRON 4 MG PO TBDP
8.0000 mg | ORAL_TABLET | Freq: Once | ORAL | Status: AC
  Administered 2021-06-12: 8 mg via ORAL
  Filled 2021-06-12: qty 2

## 2021-06-12 NOTE — ED Notes (Signed)
Sitter is playing sound therapy from computer to help pt environment remain calm - talking with sitter about various bands that Pt  likes. Pt engaging in good conversations with sitter at this time.

## 2021-06-12 NOTE — ED Notes (Signed)
Pt states "I don't like being in this area, I would rather be back in the hallway, this place looks like a prison and is making me anxioius"  Pt requests medication to help with this, given hydroxyzine as ordered.  No violent outbursts, otherwise cooperative with care and rules of unit

## 2021-06-12 NOTE — ED Provider Notes (Signed)
Emergency Medicine Observation Re-evaluation Note 12:36 PM  Pat Christian Hartman is a 31 y.o. male, seen on rounds today.  Pt initially presented to the ED for complaints of Psychiatric Evaluation Currently, the patient is sleeping.  Physical Exam  BP (!) 128/91 (BP Location: Right Arm)    Pulse 75    Temp 97.7 F (36.5 C) (Oral)    Resp 16    Ht 6\' 1"  (1.854 m)    Wt 74.8 kg    SpO2 100%    BMI 21.77 kg/m  Physical Exam General: No distress Cardiac: Rate and rhythm Lungs: No increased work of breathing Psych: Calm  ED Course / MDM  EKG:EKG Interpretation  Date/Time:  Friday June 10 2021 21:07:16 EST Ventricular Rate:  106 PR Interval:  116 QRS Duration: 102 QT Interval:  326 QTC Calculation: 433 R Axis:   60 Text Interpretation: Sinus tachycardia Otherwise normal ECG No previous ECGs available Confirmed by Lennice Sites ([id number]) on 06/10/2021 9:21:14 PM  I have reviewed the labs performed to date as well as medications administered while in observation.  Recent changes in the last 24 hours include substantially more calm.  Plan  Current plan is for behavior health placement. Christian Hartman is under involuntary commitment.      Carmin Muskrat, MD 06/12/21 1236

## 2021-06-12 NOTE — ED Notes (Signed)
Lunch delivered. 

## 2021-06-12 NOTE — ED Notes (Signed)
Pt and sitter did a 360' walk around the nurses station a couple times. This seem to help pt remain calm. Will continue to monitor

## 2021-06-13 ENCOUNTER — Encounter (HOSPITAL_COMMUNITY): Payer: Self-pay | Admitting: Registered Nurse

## 2021-06-13 DIAGNOSIS — Z046 Encounter for general psychiatric examination, requested by authority: Secondary | ICD-10-CM

## 2021-06-13 DIAGNOSIS — F4323 Adjustment disorder with mixed anxiety and depressed mood: Secondary | ICD-10-CM

## 2021-06-13 MED ORDER — ZIPRASIDONE MESYLATE 20 MG IM SOLR
20.0000 mg | Freq: Once | INTRAMUSCULAR | Status: AC
  Administered 2021-06-13: 20 mg via INTRAMUSCULAR
  Filled 2021-06-13: qty 20

## 2021-06-13 MED ORDER — STERILE WATER FOR INJECTION IJ SOLN
5.0000 mL | INTRAMUSCULAR | Status: AC
Start: 2021-06-13 — End: 2021-06-13

## 2021-06-13 MED ORDER — STERILE WATER FOR INJECTION IJ SOLN
INTRAMUSCULAR | Status: AC
  Administered 2021-06-13: 1 mL via INTRAMUSCULAR
  Filled 2021-06-13: qty 10

## 2021-06-13 NOTE — Consult Note (Signed)
Christian Hartman, 31 y.o., male patient seen face to face by this provider, consulted with Dr. Nelly Rout; and chart reviewed on 06/13/21.  On evaluation Christian Hartman reports he initially came to the emergency room because the police told him he needed to have an evaluation and he later found out he had been involuntary committed by his ex fiance.  "I guess she was afraid that I was going to do something to her when she told me she wanted to break-up with me.  I do have a problem with anger."  Patient reporting chronic history of depression.  Patient also endorsing suicidal ideation; stating " yesterday or the day before I had at home, girlfriend, and something to live for.  Today I do not have anything."  Patient reporting daily alcohol use.  Stating he drinks least 9 shots of liquor daily.  Patient denies homicidal ideation, psychosis, and paranoia. During evaluation Christian Hartman is sitting up in bed in no acute distress.  He is alert, oriented x 4, calm and cooperative.  His mood is depressed and tearful with congruent affect.  He does not appear to be responding to internal/external stimuli or delusional thoughts.  Patient denies suicidal/self-harm/homicidal ideation, psychosis, and paranoia.  Patient answered question appropriately.  Patient is unable to contract for safety   Psychiatric Specialty Exam: Physical Exam Vitals and nursing note reviewed. Chaperone present: Sitter at bedside.  Constitutional:      General: He is not in acute distress.    Appearance: Normal appearance. He is not ill-appearing.  Cardiovascular:     Rate and Rhythm: Normal rate.  Pulmonary:     Effort: Pulmonary effort is normal.  Musculoskeletal:     Cervical back: Normal range of motion.     Comments: Right below the knee amputation.  Skin:    General: Skin is warm and dry.  Neurological:     Mental Status: He is alert and oriented to person, place, and time.  Psychiatric:        Attention  and Perception: Attention and perception normal. He does not perceive auditory or visual hallucinations.        Mood and Affect: Mood is anxious and depressed.        Speech: Speech normal.        Behavior: Behavior normal. Behavior is cooperative.        Thought Content: Thought content is not paranoid or delusional. Thought content includes suicidal ideation. Thought content does not include homicidal ideation. Thought content does not include suicidal plan.        Cognition and Memory: Cognition and memory normal.        Judgment: Judgment is impulsive.    Review of Systems  Psychiatric/Behavioral:  Positive for agitation and suicidal ideas. Negative for hallucinations. The patient is nervous/anxious.    Blood pressure (!) 151/99, pulse 91, temperature 98.9 F (37.2 C), temperature source Oral, resp. rate 18, height 6\' 1"  (1.854 m), weight 74.8 kg, SpO2 98 %.Body mass index is 21.77 kg/m.  General Appearance: Casual  Eye Contact:  Good  Speech:  Clear and Coherent and Normal Rate  Volume:  Normal  Mood:  Anxious, Depressed, and Irritable  Affect:  Congruent  Thought Process:  Coherent, Goal Directed, and Descriptions of Associations: Intact  Orientation:  Full (Time, Place, and Person)  Thought Content:  Logical  Suicidal Thoughts:  Yes.  without intent/plan  Homicidal Thoughts:  No  Memory:  Immediate;   Good Recent;  Good Remote;   Good  Judgement:  Intact  Insight:  Present  Psychomotor Activity:  Normal  Concentration:  Concentration: Good and Attention Span: Good  Recall:  Good  Fund of Knowledge:  Good  Language:  Good  Akathisia:  No  Handed:  Right  AIMS (if indicated):     Assets:  Communication Skills Desire for Improvement Housing Social Support  ADL's:  Intact  Cognition:  WNL  Sleep:      Medication Management:  buPROPion  100 mg Oral Daily   citalopram  20 mg Oral Daily   pregabalin  75 mg Oral BID   QUEtiapine  50 mg Oral QHS   ziprasidone  20 mg  Intramuscular Once     Disposition: Recommend psychiatric Inpatient admission when medically cleared.

## 2021-06-13 NOTE — ED Provider Notes (Signed)
Emergency Medicine Observation Re-evaluation Note  Christian Hartman is a 31 y.o. male, seen on rounds today.  Pt initially presented to the ED for complaints of Psychiatric Evaluation Currently, the patient is sleeping.  Physical Exam  BP (!) 118/98 (BP Location: Right Arm)    Pulse (!) 104    Temp 97.9 F (36.6 C) (Oral)    Resp 18    Ht 6\' 1"  (1.854 m)    Wt 74.8 kg    SpO2 100%    BMI 21.77 kg/m  Physical Exam General: Sleeping Lungs: Respirations even and unlabored  ED Course / MDM  EKG:EKG Interpretation  Date/Time:  Friday June 10 2021 21:07:16 EST Ventricular Rate:  106 PR Interval:  116 QRS Duration: 102 QT Interval:  326 QTC Calculation: 433 R Axis:   60 Text Interpretation: Sinus tachycardia Otherwise normal ECG No previous ECGs available Confirmed by [id number] ([id number]) on 06/10/2021 9:21:14 PM  I have reviewed the labs performed to date as well as medications administered while in observation.  Recent changes in the last 24 hours include calm and cooperative, eating breakfast this morning.  Plan  Current plan is for pending placement.  Patient had order for daily potassium, has received 2 doses, have discontinued, review of prior potassium on admission of 3.2. Christian Hartman is under involuntary commitment.      Christian Pigg, PA-C 06/13/21 1118    08/11/21, MD 06/14/21 [phone]
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
## 2021-06-13 NOTE — ED Notes (Signed)
Pt has been very calm and compliant this morning, pleasant and appears to be in a good mood. He ate breakfast, is watching TV, and took his meds without difficulty.

## 2021-06-13 NOTE — ED Notes (Signed)
Pt is not agreeable to visits from mom

## 2021-06-13 NOTE — ED Notes (Addendum)
Patient requested compression sock for stump, requested from materials at this time. Informed that that item is not available currently.
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
## 2021-06-13 NOTE — ED Notes (Signed)
Patient approached desk and requested to make phone call. RN informed patient that he was allowed two phone calls per day and patient voiced understanding. Patient made phone call and returned to room.

## 2021-06-13 NOTE — ED Notes (Signed)
Electrical socket repaired by maintenance.

## 2021-06-14 MED ORDER — LORAZEPAM 2 MG/ML IJ SOLN
2.0000 mg | Freq: Once | INTRAMUSCULAR | Status: AC
  Administered 2021-06-14: 2 mg via INTRAMUSCULAR
  Filled 2021-06-14: qty 1

## 2021-06-14 NOTE — Progress Notes (Signed)
Patient has been denied by [facility] after not being determined an appropriate fit. Patient meets [facility] inpatient criteria per Earleen Newport, NP. Patient has been faxed out to multiple facilities.    Mariea Clonts, MSW, LCSW-A  1:13 PM 06/14/2021

## 2021-06-14 NOTE — ED Provider Notes (Signed)
Emergency Medicine Observation Re-evaluation Note  Christian Hartman is a 31 y.o. male, seen on rounds today.  Pt initially presented to the ED for complaints of Psychiatric Evaluation Currently, the patient is resting.  Physical Exam  BP (!) 153/102 (BP Location: Right Arm)    Pulse 82    Temp 98.3 F (36.8 C) (Oral)    Resp 19    Ht 6\' 1"  (1.854 m)    Wt 74.8 kg    SpO2 97%    BMI 21.77 kg/m  Physical Exam General: No distress Cardiac: Regular rate and rhythm Lungs: No increased work of breathing Psych: Calm, withdrawn  ED Course / MDM  EKG:EKG Interpretation  Date/Time:  Friday June 10 2021 21:07:16 EST Ventricular Rate:  106 PR Interval:  116 QRS Duration: 102 QT Interval:  326 QTC Calculation: 433 R Axis:   60 Text Interpretation: Sinus tachycardia Otherwise normal ECG No previous ECGs available Confirmed by [id number] ([id number]) on 06/10/2021 9:21:14 PM  I have reviewed the labs performed to date as well as medications administered while in observation.  Recent changes in the last 24 hours include an eventful day, well documented on yesterday's notes.  Thus far today patient has been calm, with no similar episodes.  Plan  Current plan is for placement. Christian Hartman is under involuntary commitment.      Rayetta Pigg, MD 06/14/21 1010

## 2021-06-14 NOTE — ED Notes (Signed)
Pt is sitting in common area, having discussion w/ other patient and staff. Pt is behaving appropriately.

## 2021-06-14 NOTE — Progress Notes (Signed)
Inpatient Behavioral Health Placement   Pt meets inpatient criteria per Assunta Found, NP. There are no appropriate beds at [facility] per [facility] Fransico Michael, RN  Referral was sent to out of network providers.   Destination Service Provider Address Phone Fax  [facility]  [address], [place] [address] [phone]  [facility] [address] [place]  [address] [place] [address] [phone]  [facility]  [address] [place] [address] [phone]  [facility]  [address] [place] [address] [phone]  [facility]  [address] [place], [place] [address] [phone]  [facility]  [address] [place] [address] [phone]  [facility]  [address] [place] [address] [phone]  [facility]  [address], [place] [address] [phone]  [facility]  [address], [place] [address] [phone]  [facility]  [address], [place] [address] [phone]  [facility]  [address], [place] [address] [phone]  [facility]  [address] [place] [address] [phone]  [facility]  [address] [place] [address] [phone]  [facility]  [address] [place] [address] [phone]    Situation ongoing,  CSW will follow up.   Maryjean Ka, MSW, LCSWA 06/14/2021  @ 12:29 AM

## 2021-06-15 NOTE — Care Management (Signed)
Patient told NT Clifton James that he has an uncle who he can stay with if  cleared for discharged patient has uncle's contact information

## 2021-06-15 NOTE — ED Notes (Signed)
Pt at staff desk requesting med for anxiety. PRN given.

## 2021-06-15 NOTE — Progress Notes (Signed)
Patient has been denied by [facility] and [facility] due to acuity. Patient meets [facility] inpatient criteria per Assunta Found, NP. Patient has been faxed out to the following facilities:    [facility]  [address], [place] [address] [phone]  [facility] [address] [place]  [address] [place] [address] [phone]  [facility]  [address] [place] [address] [phone]  [facility]  [address] [place] [address] [phone]  [facility]  [address] [place], [place] [address] [phone]  [facility]  [address] [place] [address] [phone]  [facility]  [address] [place] [address] [phone]  [facility]  [address] [place] [address] [phone]  [facility]  [address] [place] [address] [phone]  [facility]  [address] [place] [address] [phone]  [facility]  [address] [place] [address] [phone]  [facility]  [address] [place] [address] [phone]  [facility]  [address], [place] [address] [phone]  [facility]  [address] [place] [address] [phone]  [facility]  [address] [place] [address] [phone]  [facility]  [address] [place] [address] [phone]  [facility] [address]  [address] [place] [address] [phone]  [facility]  [address] [place],  [place] [address] [phone]  [facility]  [address], [place] [address] [phone]  [facility]  [address] [place] [address] [phone]  [facility]  [address] [place] [address] [phone]  [facility]  [address] [place], [place] [address] [phone]  [facility]  [address], [place] [address] [phone]   Damita Dunnings, MSW, LCSW-A  2:56 PM 06/15/2021

## 2021-06-15 NOTE — ED Notes (Signed)
Pt given 2 orange juice but states he does not want a snack
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
## 2021-06-16 MED ORDER — LORAZEPAM 1 MG PO TABS
1.0000 mg | ORAL_TABLET | Freq: Once | ORAL | Status: AC
  Administered 2021-06-16: 1 mg via ORAL
  Filled 2021-06-16: qty 1

## 2021-06-16 NOTE — ED Notes (Signed)
IVC paperwork faxed to [facility], fax confirmation ok.

## 2021-06-16 NOTE — ED Provider Notes (Signed)
Patient feeling a little bit agitated.  Has had his normal meds.  We will give some oral Ativan.  Order placed.   Vanetta Mulders, MD 06/16/21 2222

## 2021-06-16 NOTE — ED Notes (Signed)
MD notified pt continues to feel anxious and agitated. Waiting for further orders. Pt cooperative and anxious at this time. Will continue to monitor.

## 2021-06-16 NOTE — Progress Notes (Signed)
[facility] LCSW Progress Note   06/16/2021    11:51 AM  Christian Hartman   [id number]   Type of Contact and Topic:  Psychiatric Bed Placement   Pt accepted to [facility]      Patient meets inpatient criteria per Assunta Found, NP  The attending provider will be Estill Cotta, MD  Call report to [phone] or [phone]  Celine Ahr, RN @ [facility] ED notified.     Pt scheduled  to arrive at [facility] for TOMORROW After 0900.  Please fax IVC paperwork to [phone] prior to transport.   Damita Dunnings, MSW, LCSW-A  11:52 AM 06/16/2021

## 2021-06-17 NOTE — ED Notes (Signed)
[place] [employer] called to arrange transport for patient

## 2021-06-17 NOTE — ED Notes (Addendum)
Unable to get an answer for patient report

## 2021-06-17 NOTE — ED Notes (Signed)
Called report to [place], Charity fundraiser at [facility].

## 2021-06-20 ENCOUNTER — Encounter: Payer: Self-pay | Admitting: Orthopedic Surgery

## 2021-06-21 ENCOUNTER — Ambulatory Visit: Payer: Self-pay | Admitting: Orthopedic Surgery

## 2021-07-05 ENCOUNTER — Other Ambulatory Visit: Payer: Self-pay

## 2021-07-05 ENCOUNTER — Ambulatory Visit (INDEPENDENT_AMBULATORY_CARE_PROVIDER_SITE_OTHER): Payer: Self-pay | Admitting: Orthopedic Surgery

## 2021-07-05 DIAGNOSIS — Z89511 Acquired absence of right leg below knee: Secondary | ICD-10-CM

## 2021-07-06 ENCOUNTER — Encounter: Payer: Self-pay | Admitting: Orthopedic Surgery
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
## 2021-07-20 ENCOUNTER — Encounter (HOSPITAL_COMMUNITY): Payer: Self-pay | Admitting: Physician Assistant

## 2021-07-20 ENCOUNTER — Ambulatory Visit (INDEPENDENT_AMBULATORY_CARE_PROVIDER_SITE_OTHER): Payer: No Payment, Other | Admitting: Physician Assistant

## 2021-07-20 ENCOUNTER — Other Ambulatory Visit: Payer: Self-pay

## 2021-07-20 VITALS — BP 104/60 | HR 73 | Ht 74.0 in | Wt 164.0 lb

## 2021-07-20 DIAGNOSIS — F6381 Intermittent explosive disorder: Secondary | ICD-10-CM | POA: Diagnosis not present

## 2021-07-20 DIAGNOSIS — F411 Generalized anxiety disorder: Secondary | ICD-10-CM

## 2021-07-20 DIAGNOSIS — F331 Major depressive disorder, recurrent, moderate: Secondary | ICD-10-CM | POA: Diagnosis not present

## 2021-07-20 MED ORDER — TRAZODONE HCL 100 MG PO TABS: 100.0000 mg | ORAL_TABLET | Freq: Every day | ORAL | 1 refills | Status: DC

## 2021-07-20 MED ORDER — HYDROXYZINE HCL 10 MG PO TABS: 10.0000 mg | ORAL_TABLET | Freq: Three times a day (TID) | ORAL | 1 refills | Status: DC | PRN
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
## 2021-07-28 ENCOUNTER — Ambulatory Visit (HOSPITAL_COMMUNITY): Payer: Self-pay | Admitting: Psychiatry

## 2021-08-01 ENCOUNTER — Telehealth: Payer: Self-pay | Admitting: Orthopedic Surgery

## 2021-08-01 NOTE — Telephone Encounter (Signed)
Pt called requesting a script for right prostatic leg. Please call pt this pt about this matter at [phone].

## 2021-08-01 NOTE — Telephone Encounter (Signed)
Called number provided below twice and goes straight to VM and gives me a different name than pt's name. I did not LM

## 2021-08-02 NOTE — Telephone Encounter (Signed)
Called mother (legal gaurdian) LM on VM to let me know if script needs to go to [facility] here in [place]. Awaiting call back

## 2021-08-02 NOTE — Telephone Encounter (Signed)
Mother called back and stated [facility]. Will have Rx sent.

## 2021-08-04 ENCOUNTER — Ambulatory Visit (HOSPITAL_COMMUNITY): Payer: Self-pay | Admitting: Student in an Organized Health Care Education/Training Program

## 2021-08-31 ENCOUNTER — Encounter (HOSPITAL_COMMUNITY): Payer: Self-pay | Admitting: Physician Assistant

## 2021-08-31 ENCOUNTER — Ambulatory Visit (INDEPENDENT_AMBULATORY_CARE_PROVIDER_SITE_OTHER): Payer: No Payment, Other | Admitting: Physician Assistant

## 2021-08-31 DIAGNOSIS — F411 Generalized anxiety disorder: Secondary | ICD-10-CM

## 2021-08-31 DIAGNOSIS — F6381 Intermittent explosive disorder: Secondary | ICD-10-CM

## 2021-08-31 DIAGNOSIS — F4323 Adjustment disorder with mixed anxiety and depressed mood: Secondary | ICD-10-CM | POA: Diagnosis not present

## 2021-08-31 MED ORDER — AMITRIPTYLINE HCL 25 MG PO TABS: ORAL_TABLET | ORAL | 1 refills | Status: DC

## 2021-08-31 MED ORDER — HYDROXYZINE HCL 10 MG PO TABS: 10.0000 mg | ORAL_TABLET | Freq: Three times a day (TID) | ORAL | 1 refills | Status: DC | PRN

## 2021-08-31 NOTE — Progress Notes (Signed)
BH MD/PA/NP OP Progress Note ? ?08/31/2021 7:27 PM ?Christian Hartman  ?MRN:  [id number] ? ?Chief Complaint:  ?Chief Complaint  ?Patient presents with  ? Follow-up  ? Medication Management  ? ?HPI:  ? ?Christian Hartman is a 31 year old male with a past psychiatric history significant for adjustment disorder with mixed anxiety and depressed mood and generalized anxiety disorder who [facility] for follow-up and medication management.  Patient is currently being managed on the following medications: ? ?Hydroxyzine 10 mg 3 times daily as needed ?Trazodone 100 mg at bedtime ? ?Patient reports that was helpful with managing intense emotions and helping him to get his mind off stuff.  He reports that at night, he still experiences instances where he gets deep into depression.  Patient is unsure if his medication regimen has been helpful with his angry outbursts or if he is not interacting with factors that would encourage his outburst.  Patient states that it has been difficult to pin down what causes his outburst stating that he does not talk to people most days. ? ?Patient states that the use of his trazodone was helpful in the beginning with the management of his sleep but recently, he has not been having good sleep.  Patient states that he has also been experiencing his heart beating really fast when taking the medication.  Patient endorses racing thoughts especially when preparing for bed.  He endorses experiencing outbursts roughly 2 times a week.  He endorses depressive episodes roughly 4 times a week and endorses the following symptoms: feelings of sadness, lack of motivation, and difficulty getting out of bed.  Patient is unable to quantify his anxiety but states that he has been closed off most days.  He reports that things annoy him related to his family.  He continues to endorse no interest in talking with people and wishes he had enough confidence to go  outside and walk around without being confronted or talked to.  A PHQ-9 screen was performed with the patient scoring a 21.  A GAD-7 screen was also performed with the patient scoring a 14.  A [place] Suicide Severity Rating Scale was performed with the patient being considered high risk. ? ?Patient is alert and oriented x4, calm, cooperative, and fully engaged in conversation during the encounter.  Patient endorses neutral mood.  Patient denies suicidal or homicidal ideations.  He further denies auditory or visual hallucinations and does not appear to be responding to internal/external stimuli.  Patient endorses fair sleep and receives around 4 to 5 hours of sleep each night.  Patient reports that he rarely receives 6 hours of sleep at a time.  Patient endorses decreased appetite and states that he mainly eats out of boredom.  Patient eats on average 1-2 meals per day.  Patient endorses alcohol consumption occasionally.  Patient endorses tobacco use as well as engages in vaping.  Patient denies illicit drug use but states that he does use CBD products. ? ?Visit Diagnosis:  ?  ICD-10-CM   ?1. Adjustment disorder with mixed anxiety and depressed mood  F43.23 amitriptyline (ELAVIL) 25 MG tablet  ?  ?2. Generalized anxiety disorder  F41.1 hydrOXYzine (ATARAX) 10 MG tablet  ?  ?3. Intermittent explosive disorder in adult  F63.81   ?  ? ? ?Past Psychiatric History:  ?Adjustment disorder with mixed anxiety and depressed mood ?Generalized anxiety disorder ?Intermittent explosive disorder in adult ? ?Past Medical History:  ?Past Medical History:  ?  Diagnosis Date  ? Asthma   ?  ?Past Surgical History:  ?Procedure Laterality Date  ? AMPUTATION Right 12/16/2020  ? Procedure: PARTIAL RIGHT AMPUTATION BELOW KNEE;  Surgeon: Yolonda Kida, MD;  Location: [facility] OR;  Service: Orthopedics;  Laterality: Right;  ? AMPUTATION Right 12/22/2020  ? Procedure: RIGHT BELOW KNEE AMPUTATION;  Surgeon: Nadara Mustard, MD;  Location: [facility] OR;   Service: Orthopedics;  Laterality: Right;  ? APPLICATION OF WOUND VAC Right 12/22/2020  ? Procedure: APPLICATION OF WOUND VAC;  Surgeon: Nadara Mustard, MD;  Location: [facility] OR;  Service: Orthopedics;  Laterality: Right;  ? THORACIC AORTIC ENDOVASCULAR STENT GRAFT N/A 12/16/2020  ? Procedure: THORACIC AORTIC ENDOVASCULAR STENT GRAFT;  Surgeon: Leonie Douglas, MD;  Location: [facility] OR;  Service: Vascular;  Laterality: N/A;  ? ? ?Family Psychiatric History:  ?Patient denies family history of psychiatric illness ? ?Family History:  ?Family History  ?Problem Relation Age of Onset  ? Congestive Heart Failure Maternal Grandfather   ? Diabetes Maternal Grandfather   ? Hypertension Maternal Grandfather   ? Alcohol abuse Maternal Grandfather   ? Diabetes Maternal Aunt   ? Alcohol abuse Maternal Aunt   ? Hypertension Maternal Grandmother   ? ? ?Social History:  ?Social History  ? ?Socioeconomic History  ? Marital status: Single  ?  Spouse name: Not on file  ? Number of children: 0  ? Years of education: 63  ? Highest education level: Not on file  ?Occupational History  ?  Employer: [employer]  ?Tobacco Use  ? Smoking status: Every Day  ?  Packs/day: 0.50  ?  Types: Cigarettes  ? Smokeless tobacco: Never  ?Vaping Use  ? Vaping Use: Never used  ?Substance and Sexual Activity  ? Alcohol use: Yes  ?  Alcohol/week: 16.0 standard drinks  ?  Types: 16 Shots of liquor per week  ? Drug use: Yes  ?  Types: Marijuana  ?  Comment: Mother stated she wasn't sure  ? Sexual activity: Yes  ?Other Topics Concern  ? Not on file  ?Social History Narrative  ? ** Merged History Encounter **  ?    ? ** Merged History Encounter **  ? Patient lives at home with his mother Annabelle Harman).   ? Right handed  ? Patient is not working right now.  ? Caffeine- sodas two   ?    ? ?Social Determinants of Health  ? ?Financial Resource Strain: Not on file  ?Food Insecurity: Not on file  ?Transportation Needs: Not on file  ?Physical Activity: Not on file  ?Stress:  Not on file  ?Social Connections: Not on file  ? ? ?Allergies: No Known Allergies ? ?Metabolic Disorder Labs: ?No results found for: HGBA1C, MPG ?No results found for: PROLACTIN ?Lab Results  ?Component Value Date  ? TRIG 193 (H) 12/27/2020  ? ?No results found for: TSH ? ?Therapeutic Level Labs: ?No results found for: LITHIUM ?No results found for: VALPROATE ?No components found for:  CBMZ ? ?Current Medications: ?Current Outpatient Medications  ?Medication Sig Dispense Refill  ? amitriptyline (ELAVIL) 25 MG tablet Take 1 tablet (25 mg total) by mouth at bedtime for 6 days, THEN 2 tablets (50 mg total) at bedtime. 60 tablet 1  ? albuterol (PROVENTIL) (5 MG/ML) 0.5% nebulizer solution Take 2.5 mg by nebulization every 6 (six) hours as needed for wheezing or shortness of breath.    ? albuterol (VENTOLIN HFA) 108 (90 Base) MCG/ACT inhaler Inhale 1-2 puffs  into the lungs every 6 (six) hours as needed for wheezing or shortness of breath.    ? buPROPion (WELLBUTRIN) 100 MG tablet Take 1 tablet (100 mg total) by mouth daily. 30 tablet 1  ? citalopram (CELEXA) 20 MG tablet Take 1 tablet (20 mg total) by mouth daily. 30 tablet 1  ? hydrOXYzine (ATARAX) 10 MG tablet Take 1 tablet (10 mg total) by mouth 3 (three) times daily as needed. 75 tablet 1  ? pregabalin (LYRICA) 75 MG capsule Take 1 capsule (75 mg total) by mouth 2 (two) times daily. 20 capsule 0  ? QUEtiapine (SEROQUEL) 50 MG tablet Take 1 tablet (50 mg total) by mouth at bedtime. 30 tablet 0  ? ?No current facility-administered medications for this visit.  ? ? ? ?Musculoskeletal: ?Strength & Muscle Tone: within normal limits ?Gait & Station: Patient utilizes crutches for ambulation due to loss of right foot ?Patient leans: N/A ? ?Psychiatric Specialty Exam: ?Review of Systems  ?Psychiatric/Behavioral:  Positive for sleep disturbance. Negative for decreased concentration, dysphoric mood, hallucinations, self-injury and suicidal ideas. The patient is nervous/anxious.  The patient is not hyperactive.    ?There were no vitals taken for this visit.There is no height or weight on file to calculate BMI.  ?General Appearance: Well Groomed  ?Eye Contact:  Good  ?Speech:  Clear

## 2021-09-05 ENCOUNTER — Ambulatory Visit: Payer: Self-pay | Admitting: Orthopedic Surgery

## 2021-09-20 ENCOUNTER — Encounter (HOSPITAL_COMMUNITY): Payer: Self-pay

## 2021-09-20 ENCOUNTER — Encounter (HOSPITAL_COMMUNITY): Payer: Self-pay | Admitting: Licensed Clinical Social Worker

## 2021-09-20 ENCOUNTER — Ambulatory Visit (INDEPENDENT_AMBULATORY_CARE_PROVIDER_SITE_OTHER): Payer: No Payment, Other | Admitting: Licensed Clinical Social Worker

## 2021-09-20 DIAGNOSIS — F331 Major depressive disorder, recurrent, moderate: Secondary | ICD-10-CM

## 2021-09-20 DIAGNOSIS — F6381 Intermittent explosive disorder: Secondary | ICD-10-CM
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
## 2021-09-20 NOTE — Plan of Care (Signed)
Pt agreeable to plan  ?

## 2021-10-12 ENCOUNTER — Ambulatory Visit (INDEPENDENT_AMBULATORY_CARE_PROVIDER_SITE_OTHER): Payer: Medicaid Other | Admitting: Physician Assistant

## 2021-10-12 ENCOUNTER — Encounter (HOSPITAL_COMMUNITY): Payer: Self-pay | Admitting: Physician Assistant

## 2021-10-12 VITALS — BP 103/80 | HR 110 | Ht 73.0 in | Wt 160.0 lb

## 2021-10-12 DIAGNOSIS — F411 Generalized anxiety disorder: Secondary | ICD-10-CM

## 2021-10-12 DIAGNOSIS — F4323 Adjustment disorder with mixed anxiety and depressed mood: Secondary | ICD-10-CM

## 2021-10-12 DIAGNOSIS — F6381 Intermittent explosive disorder: Secondary | ICD-10-CM

## 2021-10-12 MED ORDER — CITALOPRAM HYDROBROMIDE 10 MG PO TABS: ORAL_TABLET | ORAL | 1 refills | Status: DC

## 2021-10-12 MED ORDER — HYDROXYZINE HCL 10 MG PO TABS: 10.0000 mg | ORAL_TABLET | Freq: Three times a day (TID) | ORAL | 1 refills | Status: DC | PRN

## 2021-10-12 MED ORDER — AMITRIPTYLINE HCL 25 MG PO TABS: 50.0000 mg | ORAL_TABLET | Freq: Every day | ORAL | 1 refills | Status: DC

## 2021-10-14 ENCOUNTER — Encounter (HOSPITAL_COMMUNITY): Payer: Self-pay | Admitting: Physician Assistant

## 2021-10-14 MED ORDER — AMITRIPTYLINE HCL 50 MG PO TABS: 50.0000 mg | ORAL_TABLET | Freq: Every day | ORAL | 1 refills | Status: DC

## 2021-10-14 NOTE — Progress Notes (Addendum)
BH MD/PA/NP OP Progress Note  10/14/2021 7:37 PM Christian Hartman  MRN:  [id number]  Chief Complaint:  Chief Complaint  Patient presents with   Follow-up   Medication Management   HPI:   Christian Hartman is a 31 year old male with a past psychiatric history significant for adjustment disorder with mixed anxiety and depressed mood and generalized anxiety disorder who presents to [facility] for follow-up and medication management.  Patient is currently being managed on the following medications:  Hydroxyzine 10 mg 3 times daily as needed Amitriptyline 25 mg at bedtime  In regards to his amitriptyline use, patient reports some nights it helped more than others.  Patient adds that his use of hydroxyzine has not been as helpful in the management of his anxiety.  Patient recalls an incident where he began ranting to and from his last therapy appointment due to missing a previous appointment that day.  Patient reports that his ranting was an extension of his anxiety and that his hydroxyzine was not able to prevent his rant.  He reports that his depression is not as bad as when he was discharged from the hospital but he continues to have intrusive thoughts that occur randomly centered around his past.  Patient endorses anxiety and rates his anxiety as 7 or 8 out of 10.  Patient's current stressors include waiting for his disability checks to come through and waiting for Medicaid to approve his prosthetics.  When having free time, patient spends his time writing poetry or composing stories.  A PHQ-9 screen was performed with the patient scoring a 17.  A GAD-7 screen was also performed with the patient scored a 14.  A [place] Suicide Severity Rating Scale was performed with the patient being considered moderate risk.  Patient is alert and oriented x4, calm, cooperative, and fully engaged in conversation during the encounter.  Patient endorses fine mood.   Patient denies suicidal or homicidal ideations.  He further denies auditory or visual hallucinations and does not appear to be responding to internal/external stimuli.  Patient endorses poor sleep and receives on average 4 hours of sleep each night.  Patient endorses fair appetite and eats on average 1-2 meals per day.  Patient denies alcohol consumption and illicit drug use.  Patient endorses tobacco use and smokes on average a pack per day.  Patient states that he also engages in [place].  Visit Diagnosis:    ICD-10-CM   1. Intermittent explosive disorder in adult  F63.81     2. Adjustment disorder with mixed anxiety and depressed mood  F43.23 citalopram (CELEXA) 10 MG tablet    amitriptyline (ELAVIL) 50 MG tablet    DISCONTINUED: amitriptyline (ELAVIL) 25 MG tablet    3. Generalized anxiety disorder  F41.1 hydrOXYzine (ATARAX) 10 MG tablet      Past Psychiatric History:  Adjustment disorder with mixed anxiety and depressed mood Generalized anxiety disorder Intermittent explosive disorder in adult  Past Medical History:  Past Medical History:  Diagnosis Date   Asthma     Past Surgical History:  Procedure Laterality Date   AMPUTATION Right 12/16/2020   Procedure: PARTIAL RIGHT AMPUTATION BELOW KNEE;  Surgeon: Nicholes Stairs, MD;  Location: [place];  Service: Orthopedics;  Laterality: Right;   AMPUTATION Right 12/22/2020   Procedure: RIGHT BELOW KNEE AMPUTATION;  Surgeon: Newt Minion, MD;  Location: [place];  Service: Orthopedics;  Laterality: Right;   APPLICATION OF WOUND VAC Right 12/22/2020   Procedure:  APPLICATION OF WOUND VAC;  Surgeon: Newt Minion, MD;  Location: [place];  Service: Orthopedics;  Laterality: Right;   THORACIC AORTIC ENDOVASCULAR STENT GRAFT N/A 12/16/2020   Procedure: THORACIC AORTIC ENDOVASCULAR STENT GRAFT;  Surgeon: Cherre Robins, MD;  Location: [facility] OR;  Service: Vascular;  Laterality: N/A;    Family Psychiatric History:  Patient denies family history  of psychiatric illness  Family History:  Family History  Problem Relation Age of Onset   Congestive Heart Failure Maternal Grandfather    Diabetes Maternal Grandfather    Hypertension Maternal Grandfather    Alcohol abuse Maternal Grandfather    Diabetes Maternal Aunt    Alcohol abuse Maternal Aunt    Hypertension Maternal Grandmother     Social History:  Social History   Socioeconomic History   Marital status: Single    Spouse name: Not on file   Number of children: 0   Years of education: 12   Highest education level: Not on file  Occupational History    Employer: [employer]  Tobacco Use   Smoking status: Every Day    Packs/day: 0.50    Types: Cigarettes, E-cigarettes   Smokeless tobacco: Never  Vaping Use   Vaping Use: Never used  Substance and Sexual Activity   Alcohol use: Not Currently    Alcohol/week: 20.0 standard drinks    Types: 20 Shots of liquor per week   Drug use: Yes    Comment: Delta 8 daily   Sexual activity: Not Currently  Other Topics Concern   Not on file  Social History Narrative   ** Merged History Encounter **       ** Merged History Encounter **   Patient lives at home with his mother Christian Hartman).    Right handed   Patient is not working right now.   Caffeine- sodas two        Social Determinants of Health   Financial Resource Strain: High Risk   Difficulty of Paying Living Expenses: Very hard  Food Insecurity: Landscape architect Present   Worried About Charity fundraiser in the Last Year: Sometimes true   Arboriculturist in the Last Year: Sometimes true  Transportation Needs: Public librarian (Medical): No   Lack of Transportation (Non-Medical): Yes  Physical Activity: Inactive   Days of Exercise per Week: 0 days   Minutes of Exercise per Session: 0 min  Stress: Stress Concern Present   Feeling of Stress : Very much  Social Connections: Socially Isolated   Frequency of Communication with  Friends and Family: Never   Frequency of Social Gatherings with Friends and Family: Never   Attends Religious Services: Never   Marine scientist or Organizations: No   Attends Music therapist: Never   Marital Status: Separated    Allergies: No Known Allergies  Metabolic Disorder Labs: No results found for: HGBA1C, MPG No results found for: PROLACTIN Lab Results  Component Value Date   TRIG 193 (H) 12/27/2020   No results found for: TSH  Therapeutic Level Labs: No results found for: LITHIUM No results found for: VALPROATE No components found for:  CBMZ  Current Medications: Current Outpatient Medications  Medication Sig Dispense Refill   albuterol (PROVENTIL) (5 MG/ML) 0.5% nebulizer solution Take 2.5 mg by nebulization every 6 (six) hours as needed for wheezing or shortness of breath.     albuterol (VENTOLIN HFA) 108 (90 Base) MCG/ACT inhaler Inhale 1-2  puffs into the lungs every 6 (six) hours as needed for wheezing or shortness of breath.     pregabalin (LYRICA) 75 MG capsule Take 1 capsule (75 mg total) by mouth 2 (two) times daily. 20 capsule 0   QUEtiapine (SEROQUEL) 50 MG tablet Take 1 tablet (50 mg total) by mouth at bedtime. 30 tablet 0   amitriptyline (ELAVIL) 50 MG tablet Take 1 tablet (50 mg total) by mouth at bedtime. 30 tablet 1   citalopram (CELEXA) 10 MG tablet Take 1 tablet (10 mg total) by mouth daily for 4 days, THEN 2 tablets (20 mg total) daily. 60 tablet 1   hydrOXYzine (ATARAX) 10 MG tablet Take 1 tablet (10 mg total) by mouth 3 (three) times daily as needed. 75 tablet 1   No current facility-administered medications for this visit.     Musculoskeletal: Strength & Muscle Tone: within normal limits Gait & Station: Patient utilizes crutches for ambulation due to loss of right foot Patient leans: N/A  Psychiatric Specialty Exam: Review of Systems  Psychiatric/Behavioral:  Positive for sleep disturbance. Negative for decreased  concentration, dysphoric mood, hallucinations, self-injury and suicidal ideas. The patient is nervous/anxious. The patient is not hyperactive.    Blood pressure 103/80, pulse (!) 110, height 6\' 1"  (1.854 m), weight 160 lb (72.6 kg), SpO2 95 %.Body mass index is 21.11 kg/m.  General Appearance: Well Groomed  Eye Contact:  Good  Speech:  Clear and Coherent and Normal Rate  Volume:  Normal  Mood:  Anxious and Depressed  Affect:  Congruent  Thought Process:  Coherent, Goal Directed, and Descriptions of Associations: Intact  Orientation:  Full (Time, Place, and Person)  Thought Content: WDL   Suicidal Thoughts:  No  Homicidal Thoughts:  No  Memory:  Immediate;   Good Recent;   Good Remote;   Fair  Judgement:  Good  Insight:  Fair  Psychomotor Activity:  Normal  Concentration:  Concentration: Good and Attention Span: Good  Recall:  [place] of Knowledge: Good  Language: Good  Akathisia:  No  Handed:  Right  AIMS (if indicated): not done  Assets:  Communication Skills Desire for Improvement Housing  ADL's:  Intact  Cognition: WNL  Sleep:  Fair   Screenings: AUDIT    Health and safety inspector from 09/20/2021 in [facility]  Alcohol Use Disorder Identification Test Final Score (AUDIT) 16      GAD-7    Flowsheet Row Office Visit from 10/12/2021 in [facility] Counselor from 09/20/2021 in [facility] Office Visit from 08/31/2021 in [facility] Office Visit from 07/20/2021 in [facility]  Total GAD-7 Score 14 18 14 12       PHQ2-9    [place] Office Visit from 10/12/2021 in [facility] Counselor from 09/20/2021 in [facility] Office Visit from 08/31/2021 in [facility] Office Visit from 07/20/2021 in [facility] Office Visit  from 02/28/2021 in [place]  PHQ-2 Total Score 5 6 6 6 6   PHQ-9 Total Score 17 21 21 22 20       [place] Office Visit from 10/12/2021 in [facility] Counselor from 09/20/2021 in [facility] Office Visit from 08/31/2021 in [place] CATEGORY Moderate Risk Moderate Risk High Risk  Assessment and Plan:   Christian Hartman is a 31 year old male with a past psychiatric history significant for adjustment disorder with mixed anxiety and depressed mood and generalized anxiety disorder who presents to [facility] for follow-up and medication management.  Patient expresses that he wishes to be placed on a medication that manages his anxiety throughout the day as opposed to a preventative measure.  Patient was recommended citalopram 10 mg for 4 days followed by 20 mg daily for the management of his depressive symptoms and anxiety.  Patient also expressed issues with sleep.  Patient was recommended increasing his amitriptyline from 25 mg to 50 mg daily for the improvement of his mood and sleep.  Patient was agreeable to recommendations.  Patient's medications to be e-prescribed to pharmacy of choice.  Collaboration of Care: Collaboration of Care: Medication Management AEB provider managing patient's psychiatric medications and Psychiatrist AEB patient being followed by mental health provider  Patient/Guardian was advised Release of Information must be obtained prior to any record release in order to collaborate their care with an outside provider. Patient/Guardian was advised if they have not already done so to contact the registration department to sign all necessary forms in order for [place] to release information regarding their care.   Consent: Patient/Guardian gives verbal consent for treatment and assignment of benefits for  services provided during this visit. Patient/Guardian expressed understanding and agreed to proceed.   1. Intermittent explosive disorder in adult   2. Adjustment disorder with mixed anxiety and depressed mood  - citalopram (CELEXA) 10 MG tablet; Take 1 tablet (10 mg total) by mouth daily for 4 days, THEN 2 tablets (20 mg total) daily.  Dispense: 60 tablet; Refill: 1 - amitriptyline (ELAVIL) 50 MG tablet; Take 1 tablet (50 mg total) by mouth at bedtime.  Dispense: 30 tablet; Refill: 1  3. Generalized anxiety disorder  - hydrOXYzine (ATARAX) 10 MG tablet; Take 1 tablet (10 mg total) by mouth 3 (three) times daily as needed.  Dispense: 75 tablet; Refill: 1  Patient to follow up in 6 weeks Provider spent a total of 25 minutes with the patient/reviewing patient's chart  Malachy Mood, PA 10/14/2021, 7:37 PM

## 2021-10-26 ENCOUNTER — Ambulatory Visit (INDEPENDENT_AMBULATORY_CARE_PROVIDER_SITE_OTHER): Payer: Medicaid Other | Admitting: Licensed Clinical Social Worker

## 2021-10-26 DIAGNOSIS — F6381 Intermittent explosive disorder: Secondary | ICD-10-CM

## 2021-10-26 DIAGNOSIS — F331 Major depressive disorder, recurrent, moderate: Secondary | ICD-10-CM

## 2021-10-26 NOTE — Progress Notes (Signed)
   THERAPIST PROGRESS NOTE  Session Time: 68  Participation Level: Active  Behavioral Response: CasualAlertAnxious and Depressed  Type of Therapy: Individual Therapy  Treatment Goals addressed:   ProgressTowards Goals: Progressing  Interventions: CBT and Motivational Interviewing  Summary: Christian Hartman is a 31 y.o. male who presents with depressed and anxious mood\affect.  Patient was pleasant, cooperative, maintained good eye contact.  He engaged well in therapy session and was dressed casually.  Patient was alert and oriented x5.  Patient reports primary stressors as anxiety, depression, illness, and financials.  Patient reports that his intermittent explosive disorder has caused him to lose friends, family, and relationship.  Patient reports that he is afraid to fail and has anxiety about people's judgment in regards to how they perceive him and his hand.  Patient reports increased isolation due to anxiety, depression, and intermittent explosive disorder.  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  LCSW advocated for patient to start up poetry again as a way for him to express his thoughts, feelings, and emotions.  Suicidal/Homicidal: Nowithout intent/plan  Therapist Response:    Intervention/Plan: LCSW utilized CBT therapy for reframing, cognitive restructuring, and education on positive affirmations.  LCSW utilized motivational interviewing for reflective listening, and open-ended questions.  LCSW used person centered therapy for empowerment.  Plan for patient is to utilize positive affirmations at least 1 time weekly.  Patient to write 1 poem  Titled why "why am I afraid to fail", and engaged in exercise 3 times weekly  Plan: Return again in 4 weeks.  Diagnosis: No diagnosis found.  Collaboration of Care: Other [employer] referral   Patient/Guardian was advised Release of Information must be obtained prior to any record  release in order to collaborate their care with an outside provider. Patient/Guardian was advised if they have not already done so to contact the registration department to sign all necessary forms in order for [place] to release information regarding their care.   Consent: Patient/Guardian gives verbal consent for treatment and assignment of benefits for services provided during this visit. Patient/Guardian expressed understanding and agreed to proceed.   Weber Cooks, LCSW 10/26/2021

## 2021-10-27 ENCOUNTER — Telehealth: Payer: Self-pay | Admitting: Orthopedic Surgery

## 2021-10-27 NOTE — Telephone Encounter (Signed)
Pt called requesting for a script for prostatic right leg. Pt states he finally has medicaid and asking for script to be sent to [facility]. Please call pt at [phone].

## 2021-10-28 NOTE — Telephone Encounter (Signed)
Signed by Denny Peon and faxing to [facility]

## 2021-10-28 NOTE — Telephone Encounter (Signed)
Called pt's number listed below, busy tone twice. Rx is signed and is outbox for medical records to fax in today.

## 2021-11-06 ENCOUNTER — Other Ambulatory Visit (HOSPITAL_COMMUNITY): Payer: Self-pay | Admitting: Physician Assistant

## 2021-11-06 DIAGNOSIS — F4323 Adjustment disorder with mixed anxiety and depressed mood: Secondary | ICD-10-CM

## 2021-11-22 ENCOUNTER — Ambulatory Visit (INDEPENDENT_AMBULATORY_CARE_PROVIDER_SITE_OTHER): Payer: Medicaid Other | Admitting: Licensed Clinical Social Worker

## 2021-11-22 DIAGNOSIS — F6381 Intermittent explosive disorder: Secondary | ICD-10-CM

## 2021-11-22 DIAGNOSIS — F4323 Adjustment disorder with mixed anxiety and depressed mood: Secondary | ICD-10-CM

## 2021-11-22 NOTE — Plan of Care (Signed)
  Problem: Depression CCP Problem  1 MDD  Goal: STG: Danial WILL ATTEND AT LEAST 80% OF SCHEDULED GROUP PSYCHOTHERAPY SESSIONS Outcome: Progressing   Problem: Anxiety Disorder CCP Problem  1 GAD  Goal: identify 3 trigger causing anxiety  Outcome: Progressing Goal: STG: Patient will participate in at least 80% of scheduled individual psychotherapy sessions Outcome: Progressing   Problem: Depression CCP Problem  1 MDD  Goal: LTG: Duglas WILL SCORE LESS THAN 10 ON THE PATIENT HEALTH QUESTIONNAIRE (PHQ-9) Outcome: Not Progressing Goal: STG: Jae WILL COMPLETE AT LEAST 80% OF ASSIGNED HOMEWORK Outcome: Not Progressing   Problem: Anxiety Disorder CCP Problem  1 GAD  Goal:  walk or exercise 3 x weekly  Outcome: Not Progressing Goal: decrease anger outburst to 1 x per week  Outcome: Not Progressing Goal: LTG: Patient will score less than 5 on the Generalized Anxiety Disorder 7 Scale (GAD-7) Outcome: Not Progressing Goal: STG: Patient will complete at least 80% of assigned homework Outcome: Not Progressing

## 2021-11-22 NOTE — Progress Notes (Signed)
   THERAPIST PROGRESS NOTE  Session Time: 26   Participation Level: Active  Behavioral Response: CasualAlertAnxious and Depressed  Type of Therapy: Individual Therapy  Treatment Goals addressed: Attend 80% of psycho therapy sessions.  ProgressTowards Goals: Progressing  Interventions: CBT and Motivational Interviewing  Summary: Christian Hartman is a 31 y.o. male who presents with depressed and anxious mood\affect.  Patient was pleasant, cooperative, maintained good eye contact.  He engaged well in therapy session was dressed casually.  Patient reports primary stressors as ex relationship, family conflict, and depression.  Patient reports isolation, lack of motivation, and poor hygiene.  Patient states that he has been struggling with his intermittent explosive disorder.  Patient reports that he struggles forgiving his mother for getting him involuntarily committed.  Patient reports that his fiance at the time and his mother just needed to talk to him, and he would have voluntarily went.  Patient reports resentment towards his mother and his ex-fianc.  Patient reports lack of motivation spending most days on YouTube and playing video games.  Patient reports decrease in playing video games.  Patient reports decrease in social interactions.  Patient does report that he has been fitted for prosthetic and has been approved for Medicaid.  Suicidal/Homicidal: Nowithout intent/plan  Therapist Response:   Intervention\plan: LCSW used CBT therapy to educate patient on positive affirmations.  LCSW provided patient with homework for watching the [facility] speech "our deepest fear".  LCSW also provided patient homework with positive affirmations which he reported as "silly".  Patient reports fear of not liking the homework provided by LCSW and being more dismissive of therapy.  LCSW used encouragement and praise to help guide the patient's to utilizing techniques learned in therapy in the home  environment.  Plan for patient is to continue to attempt homework provided by LCSW last time and follow-up with this LCSW in 1 month    Plan: Return again in 4 weeks.  Diagnosis: Intermittent explosive disorder in adult  Adjustment disorder with mixed anxiety and depressed mood  Collaboration of Care: Other None today  Patient/Guardian was advised Release of Information must be obtained prior to any record release in order to collaborate their care with an outside provider. Patient/Guardian was advised if they have not already done so to contact the registration department to sign all necessary forms in order for [place] to release information regarding their care.   Consent: Patient/Guardian gives verbal consent for treatment and assignment of benefits for services provided during this visit. Patient/Guardian expressed understanding and agreed to proceed.   Weber Cooks, LCSW 11/22/2021

## 2021-11-30 ENCOUNTER — Telehealth (HOSPITAL_COMMUNITY): Payer: Medicaid Other | Admitting: Psychiatry

## 2021-12-21 ENCOUNTER — Ambulatory Visit (HOSPITAL_COMMUNITY): Payer: Medicaid Other | Admitting: Licensed Clinical Social Worker

## 2021-12-21 DIAGNOSIS — F6381 Intermittent explosive disorder: Secondary | ICD-10-CM

## 2021-12-21 DIAGNOSIS — F4323 Adjustment disorder with mixed anxiety and depressed mood: Secondary | ICD-10-CM

## 2021-12-21 NOTE — Progress Notes (Signed)
   THERAPIST PROGRESS NOTE  Session Time: 82  Participation Level: Active  Behavioral Response: CasualAlertAnxious and Depressed  Type of Therapy: Individual Therapy  Treatment Goals addressed:   ProgressTowards Goals: Progressing  Interventions: CBT, Motivational Interviewing, and Supportive  Summary: Christian Hartman is a 31 y.o. male who presents with depressed, flat, anxious mood\affect.  Patient was pleasant, cooperative, maintained good eye contact.  He engaged well in therapy session was dressed casually.  Patient was alert and oriented x5  Patient comes in today with good news for prostatic ordered for below-knee amputation, and Social Security disability moving forward.  Patient reports that he has been connected with advocacy groups for his Social Security along with lawyers to help aid with him getting Social Security disability.  Patient reports his primary stressor is his ex relationship.  Patient reports in the past he has been trying to get back together with his ex significant other after his behavioral health hospitalization.  [name] reports recently he has given up and even wrote her a text stating that he does not want to continue to pursue the relationship.  Patient reports that things were going overall well with his ex relationship including going out on dates.  Patient reports he has a fear of rejection and being put in the friend zone.  Patient states that his IED gets in the way of being apologetic.  Other stressors for patient are mouth pain.  Patient reports that he has an appointment in mid August for a dentist.  Suicidal/Homicidal: Nowithout intent/plan  Therapist Response:     Intervention/Plan: LCSW utilized supportive therapy, motivational interviewing, and CBT therapy.  LCSW utilized cognitive restructuring and reframing for CBT therapy.  LCSW utilized positive affirmations, reflective listening, and open-ended questions for motivational interviewing.   LCSW used language for praise and encouragement in session.  LCSW spoke about the stages of change.  LCSW spoke with patient and educated him about boundary setting.  LCSW provided patient education materials" tips setting healthy boundaries".  Plan: Return again in 3 weeks.  Diagnosis: Intermittent explosive disorder in adult  Adjustment disorder with mixed anxiety and depressed mood  Collaboration of Care: Other None today   Patient/Guardian was advised Release of Information must be obtained prior to any record release in order to collaborate their care with an outside provider. Patient/Guardian was advised if they have not already done so to contact the registration department to sign all necessary forms in order for [place] to release information regarding their care.   Consent: Patient/Guardian gives verbal consent for treatment and assignment of benefits for services provided during this visit. Patient/Guardian expressed understanding and agreed to proceed.   Weber Cooks, LCSW 12/21/2021

## 2022-01-11 ENCOUNTER — Ambulatory Visit (INDEPENDENT_AMBULATORY_CARE_PROVIDER_SITE_OTHER): Payer: Medicaid Other | Admitting: Licensed Clinical Social Worker

## 2022-01-11 DIAGNOSIS — F6381 Intermittent explosive disorder: Secondary | ICD-10-CM

## 2022-01-11 DIAGNOSIS — F331 Major depressive disorder, recurrent, moderate: Secondary | ICD-10-CM

## 2022-01-11 NOTE — Plan of Care (Signed)
  Problem: Anxiety Disorder CCP Problem  1 GAD  Goal:  walk or exercise 3 x weekly  Outcome: Progressing Goal: identify 3 trigger causing anxiety  Outcome: Progressing Goal: decrease anger outburst to 1 x per week  Outcome: Progressing Goal: STG: Patient will participate in at least 80% of scheduled individual psychotherapy sessions Outcome: Progressing Goal: STG: Patient will complete at least 80% of assigned homework Outcome: Progressing   Problem: Anxiety Disorder CCP Problem  1 GAD  Goal: LTG: Patient will score less than 5 on the Generalized Anxiety Disorder 7 Scale (GAD-7) Outcome: Not Progressing
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
## 2022-01-12 ENCOUNTER — Other Ambulatory Visit: Payer: Self-pay

## 2022-01-12 DIAGNOSIS — Z95828 Presence of other vascular implants and grafts: Secondary | ICD-10-CM

## 2022-01-17 ENCOUNTER — Ambulatory Visit (INDEPENDENT_AMBULATORY_CARE_PROVIDER_SITE_OTHER): Payer: Medicaid Other | Admitting: Family

## 2022-01-17 ENCOUNTER — Encounter: Payer: Self-pay | Admitting: Family

## 2022-01-17 DIAGNOSIS — Z89511 Acquired absence of right leg below knee: Secondary | ICD-10-CM
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
## 2022-01-20 ENCOUNTER — Ambulatory Visit (HOSPITAL_COMMUNITY): Admission: RE | Admit: 2022-01-20 | Payer: Medicaid Other | Source: Ambulatory Visit

## 2022-01-25 ENCOUNTER — Telehealth (INDEPENDENT_AMBULATORY_CARE_PROVIDER_SITE_OTHER): Payer: Medicaid Other | Admitting: Physician Assistant

## 2022-01-25 ENCOUNTER — Encounter (HOSPITAL_COMMUNITY): Payer: Self-pay | Admitting: Physician Assistant

## 2022-01-25 DIAGNOSIS — F4323 Adjustment disorder with mixed anxiety and depressed mood: Secondary | ICD-10-CM | POA: Diagnosis not present

## 2022-01-25 DIAGNOSIS — F411 Generalized anxiety disorder: Secondary | ICD-10-CM

## 2022-01-25 DIAGNOSIS — R41840 Attention and concentration deficit: Secondary | ICD-10-CM

## 2022-01-25 DIAGNOSIS — F6381 Intermittent explosive disorder: Secondary | ICD-10-CM | POA: Diagnosis not present

## 2022-01-25 MED ORDER — AMITRIPTYLINE HCL 75 MG PO TABS: 75.0000 mg | ORAL_TABLET | Freq: Every day | ORAL | 1 refills | Status: DC

## 2022-01-25 MED ORDER — ATOMOXETINE HCL 40 MG PO CAPS: 40.0000 mg | ORAL_CAPSULE | Freq: Every day | ORAL | 1 refills | Status: DC

## 2022-01-25 MED ORDER — HYDROXYZINE HCL 10 MG PO TABS: 10.0000 mg | ORAL_TABLET | Freq: Three times a day (TID) | ORAL | 1 refills | Status: DC | PRN

## 2022-01-25 MED ORDER — CITALOPRAM HYDROBROMIDE 20 MG PO TABS: 20.0000 mg | ORAL_TABLET | Freq: Every day | ORAL | 1 refills | Status: DC

## 2022-01-25 NOTE — Progress Notes (Unsigned)
BH MD/PA/NP OP Progress Note  Virtual Visit via Telephone Note  I connected with Christian Hartman on 01/25/22 at  1:00 PM EDT by telephone and verified that I am speaking with the correct person using two identifiers.  Location: Patient: Home Provider: Clinic   I discussed the limitations, risks, security and privacy concerns of performing an evaluation and management service by telephone and the availability of in person appointments. I also discussed with the patient that there may be a patient responsible charge related to this service. The patient expressed understanding and agreed to proceed.  Follow Up Instructions:   I discussed the assessment and treatment plan with the patient. The patient was provided an opportunity to ask questions and all were answered. The patient agreed with the plan and demonstrated an understanding of the instructions.   The patient was advised to call back or seek an in-person evaluation if the symptoms worsen or if the condition fails to improve as anticipated.  I provided 23 minutes of non-face-to-face time during this encounter.  Meta Hatchet, PA   01/25/2022 2:59 PM Christian Hartman  MRN:  [id number]  Chief Complaint:  Chief Complaint  Patient presents with   Follow-up   Medication Management   HPI:   Christian Hartman is a 31 year old male with a past psychiatric history significant for adjustment disorder with mixed anxiety and depressed mood and generalized anxiety disorder who presents to [facility] via virtual telephone visit for follow-up and medication management.  Patient is currently being managed on the following medications:  Hydroxyzine 10 mg 3 times daily as needed Amitriptyline 50 mg at bedtime Celexa 20 mg daily  Patient reports that his amitriptyline has not been helpful with managing his sleep.  He also endorses issues with his focus and attention span.  Patient adds that he  has no motivation to do anything and is interested in medication that may help promote motivation as well as improve attention.  Patient continues to endorse depressive symptoms every day.  His depressive episodes are characterized by the following symptoms: irritability, decreased concentration, and low mood.  He states that he often becomes short tempered and will end up checking out mentally.  In order to alleviate his symptoms, patient states that he tries to distract himself with activities such as playing on his phone or watching TV.  Patient reports that he has only been taking 10 mg of Celexa daily to manage his symptoms.  Patient also continues to endorse anxiety and rates his anxiety as 7 or 8 out of 10.  Patient's current stressors include his many appointments and learning to live with his new prosthetic device.  A PHQ-9 screen was performed with the patient scoring an 18.  A GAD-7 screen was also performed with the patient scoring an 8.  Patient is alert and oriented x4, calm, cooperative, and fully engaged in conversation during the encounter.  Patient endorses mild mood stating that he is not overly anxious or irritable at this time.  Patient denies suicidal or homicidal ideations.  He further denies auditory or visual hallucinations and does not appear to be responding to internal/external stimuli.  Patient endorses poor sleep and states that he sleeps roughly 3 to 4 hours at a time.  Patient endorses fair appetite and eats on average 1-2 meals per day.  Patient endorses occasional alcohol consumption.  Patient endorses daily tobacco use and smokes on average 7 cigarettes/day.  Patient denies illicit drug  use but states that he does use CBD products.  Visit Diagnosis:    ICD-10-CM   1. Attention and concentration deficit  R41.840 atomoxetine (STRATTERA) 40 MG capsule    2. Adjustment disorder with mixed anxiety and depressed mood  F43.23 citalopram (CELEXA) 20 MG tablet    amitriptyline  (ELAVIL) 75 MG tablet    3. Generalized anxiety disorder  F41.1 hydrOXYzine (ATARAX) 10 MG tablet      Past Psychiatric History:  Adjustment disorder with mixed anxiety and depressed mood Generalized anxiety disorder Intermittent explosive disorder in adult  Past Medical History:  Past Medical History:  Diagnosis Date   Asthma     Past Surgical History:  Procedure Laterality Date   AMPUTATION Right 12/16/2020   Procedure: PARTIAL RIGHT AMPUTATION BELOW KNEE;  Surgeon: Yolonda Kida, MD;  Location: [facility] OR;  Service: Orthopedics;  Laterality: Right;   AMPUTATION Right 12/22/2020   Procedure: RIGHT BELOW KNEE AMPUTATION;  Surgeon: Nadara Mustard, MD;  Location: [facility];  Service: Orthopedics;  Laterality: Right;   APPLICATION OF WOUND VAC Right 12/22/2020   Procedure: APPLICATION OF WOUND VAC;  Surgeon: Nadara Mustard, MD;  Location: [facility] OR;  Service: Orthopedics;  Laterality: Right;   THORACIC AORTIC ENDOVASCULAR STENT GRAFT N/A 12/16/2020   Procedure: THORACIC AORTIC ENDOVASCULAR STENT GRAFT;  Surgeon: Leonie Douglas, MD;  Location: [facility] OR;  Service: Vascular;  Laterality: N/A;    Family Psychiatric History:  Patient denies family history of psychiatric illness  Family History:  Family History  Problem Relation Age of Onset   Congestive Heart Failure Maternal Grandfather    Diabetes Maternal Grandfather    Hypertension Maternal Grandfather    Alcohol abuse Maternal Grandfather    Diabetes Maternal Aunt    Alcohol abuse Maternal Aunt    Hypertension Maternal Grandmother     Social History:  Social History   Socioeconomic History   Marital status: Single    Spouse name: Not on file   Number of children: 0   Years of education: 12   Highest education level: Not on file  Occupational History    Employer: [employer]  Tobacco Use   Smoking status: Every Day    Packs/day: 0.50    Types: Cigarettes, E-cigarettes   Smokeless tobacco: Never  Vaping Use    Vaping Use: Never used  Substance and Sexual Activity   Alcohol use: Not Currently    Alcohol/week: 20.0 standard drinks of alcohol    Types: 20 Shots of liquor per week   Drug use: Yes    Comment: Delta 8 daily   Sexual activity: Not Currently  Other Topics Concern   Not on file  Social History Narrative   ** Merged History Encounter **       ** Merged History Encounter **   Patient lives at home with his mother Christian Hartman).    Right handed   Patient is not working right now.   Caffeine- sodas two        Social Determinants of Health   Financial Resource Strain: High Risk (09/20/2021)   Overall Financial Resource Strain (CARDIA)    Difficulty of Paying Living Expenses: Very hard  Food Insecurity: Food Insecurity Present (09/20/2021)   Hunger Vital Sign    Worried About Running Out of Food in the Last Year: Sometimes true    Ran Out of Food in the Last Year: Sometimes true  Transportation Needs: Unmet Transportation Needs (09/20/2021)   PRAPARE - Transportation  Lack of Transportation (Medical): No    Lack of Transportation (Non-Medical): Yes  Physical Activity: Inactive (09/20/2021)   Exercise Vital Sign    Days of Exercise per Week: 0 days    Minutes of Exercise per Session: 0 min  Stress: Stress Concern Present (09/20/2021)   [employer] - Occupational Stress Questionnaire    Feeling of Stress : Very much  Social Connections: Socially Isolated (09/20/2021)   Social Connection and Isolation Panel [NHANES]    Frequency of Communication with Friends and Family: Never    Frequency of Social Gatherings with Friends and Family: Never    Attends Religious Services: Never    Database administratorActive Member of Clubs or Organizations: No    Attends Engineer, structuralClub or Organization Meetings: Never    Marital Status: Separated    Allergies: No Known Allergies  Metabolic Disorder Labs: No results found for: "HGBA1C", "MPG" No results found for: "PROLACTIN" Lab Results  Component  Value Date   TRIG 193 (H) 12/27/2020   No results found for: "TSH"  Therapeutic Level Labs: No results found for: "LITHIUM" No results found for: "VALPROATE" No results found for: "CBMZ"  Current Medications: Current Outpatient Medications  Medication Sig Dispense Refill   atomoxetine (STRATTERA) 40 MG capsule Take 1 capsule (40 mg total) by mouth daily. 30 capsule 1   albuterol (PROVENTIL) (5 MG/ML) 0.5% nebulizer solution Take 2.5 mg by nebulization every 6 (six) hours as needed for wheezing or shortness of breath.     albuterol (VENTOLIN HFA) 108 (90 Base) MCG/ACT inhaler Inhale 1-2 puffs into the lungs every 6 (six) hours as needed for wheezing or shortness of breath.     amitriptyline (ELAVIL) 75 MG tablet Take 1 tablet (75 mg total) by mouth at bedtime. 30 tablet 1   citalopram (CELEXA) 20 MG tablet Take 1 tablet (20 mg total) by mouth daily. 30 tablet 1   hydrOXYzine (ATARAX) 10 MG tablet Take 1 tablet (10 mg total) by mouth 3 (three) times daily as needed. 75 tablet 1   pregabalin (LYRICA) 75 MG capsule Take 1 capsule (75 mg total) by mouth 2 (two) times daily. 20 capsule 0   QUEtiapine (SEROQUEL) 50 MG tablet Take 1 tablet (50 mg total) by mouth at bedtime. 30 tablet 0   No current facility-administered medications for this visit.     Musculoskeletal: Strength & Muscle Tone: Unable to assess due to telemedicine visit Gait & Station: Unable to assess due to telemedicine visit Patient leans: Unable to assess due to telemedicine visit  Psychiatric Specialty Exam: Review of Systems  Psychiatric/Behavioral:  Positive for sleep disturbance. Negative for decreased concentration, dysphoric mood, hallucinations, self-injury and suicidal ideas. The patient is nervous/anxious. The patient is not hyperactive.     There were no vitals taken for this visit.There is no height or weight on file to calculate BMI.  General Appearance: Unable to assess due to telemedicine visit  Eye  Contact:  Unable to assess due to telemedicine visit  Speech:  Clear and Coherent and Normal Rate  Volume:  Normal  Mood:  Anxious and Depressed  Affect:  Congruent  Thought Process:  Coherent, Goal Directed, and Descriptions of Associations: Intact  Orientation:  Full (Time, Place, and Person)  Thought Content: WDL   Suicidal Thoughts:  No  Homicidal Thoughts:  No  Memory:  Immediate;   Good Recent;   Good Remote;   Fair  Judgement:  Good  Insight:  Fair  Psychomotor Activity:  Normal  Concentration:  Concentration: Good and Attention Span: Good  Recall:  Fair  Fund of Knowledge: Good  Language: Good  Akathisia:  No  Handed:  Right  AIMS (if indicated): not done  Assets:  Communication Skills Desire for Improvement Housing  ADL's:  Intact  Cognition: WNL  Sleep:  Fair   Screenings: AUDIT    Advertising copywriter from 09/20/2021 in [facility]  Alcohol Use Disorder Identification Test Final Score (AUDIT) 16      GAD-7    Flowsheet Row Video Visit from 01/25/2022 in [facility] Office Visit from 10/12/2021 in [facility] Counselor from 09/20/2021 in [facility] Office Visit from 08/31/2021 in [facility] Office Visit from 07/20/2021 in [facility]  Total GAD-7 Score 8 14 18 14 12       PHQ2-9    Flowsheet Row Video Visit from 01/25/2022 in [facility] Office Visit from 10/12/2021 in [facility] Counselor from 09/20/2021 in [facility] Office Visit from 08/31/2021 in [facility] Office Visit from 07/20/2021 in [facility]  PHQ-2 Total Score 5 5 6 6 6   PHQ-9 Total Score 18 17 21 21 22       Flowsheet Row Video Visit from 01/25/2022 in [facility] Office Visit from 10/12/2021 in [facility] Counselor from 09/20/2021 in [facility]  C-SSRS RISK CATEGORY Moderate Risk Moderate Risk Moderate Risk        Assessment and Plan:   Osualdo A. Lacko is a 31 year old male with a past psychiatric history significant for adjustment disorder with mixed anxiety and depressed mood and generalized anxiety disorder who presents to [facility] via virtual telephone visit for follow-up and medication management.  Patient continues to endorse depressive symptoms every day that often results in him being irritable and short tempered.  Patient also endorses anxiety as well as issues with his focus and concentration.  He also adds that his sleep has not improved.  Provider recommended increasing his amitriptyline from 50 mg to 75 mg at bedtime for the management of his depressive symptoms and anxiety.  Patient was also recommended increasing his Celexa from 10 mg to 20 mg daily for the management of his depressive symptoms and anxiety.  Lastly, patient recommended Strattera 40 mg daily for the management of the patient's attention and concentration.  Patient was agreeable to recommendations.  Patient's medications to be prescribed to pharmacy of choice.  Collaboration of Care: Collaboration of Care: Medication Management AEB provider managing patient's psychiatric medications and Psychiatrist AEB patient being followed by mental health provider  Patient/Guardian was advised Release of Information must be obtained prior to any record release in order to collaborate their care with an outside provider. Patient/Guardian was advised if they have not already done so to contact the registration department to sign all necessary forms in order for Christian Hartman to release information regarding their care.   Consent: Patient/Guardian gives verbal consent for treatment and  assignment of benefits for services provided during this visit. Patient/Guardian expressed understanding and agreed to proceed.   1. Adjustment disorder with mixed anxiety and depressed mood  - citalopram (CELEXA) 20 MG tablet; Take 1 tablet (20 mg total) by mouth daily.  Dispense: 30 tablet; Refill: 1 - amitriptyline (ELAVIL) 75 MG  tablet; Take 1 tablet (75 mg total) by mouth at bedtime.  Dispense: 30 tablet; Refill: 1  2. Attention and concentration deficit  - atomoxetine (STRATTERA) 40 MG capsule; Take 1 capsule (40 mg total) by mouth daily.  Dispense: 30 capsule; Refill: 1  3. Generalized anxiety disorder  - hydrOXYzine (ATARAX) 10 MG tablet; Take 1 tablet (10 mg total) by mouth 3 (three) times daily as needed.  Dispense: 75 tablet; Refill: 1  4. Intermittent explosive disorder in adult  Patient to follow up in 5 weeks Provider spent a total of 23 minutes with the patient/reviewing patient's chart  Meta Hatchet, PA 01/25/2022, 2:59 PM

## 2022-01-26 ENCOUNTER — Ambulatory Visit: Payer: Medicaid Other | Attending: Family

## 2022-01-26 DIAGNOSIS — M6281 Muscle weakness (generalized): Secondary | ICD-10-CM | POA: Insufficient documentation

## 2022-01-26 DIAGNOSIS — Z89511 Acquired absence of right leg below knee: Secondary | ICD-10-CM | POA: Insufficient documentation

## 2022-01-26 DIAGNOSIS — R262 Difficulty in walking, not elsewhere classified: Secondary | ICD-10-CM | POA: Diagnosis present
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
## 2022-01-30 ENCOUNTER — Ambulatory Visit (HOSPITAL_COMMUNITY): Payer: Medicaid Other | Admitting: Licensed Clinical Social Worker

## 2022-01-30 ENCOUNTER — Ambulatory Visit: Payer: Medicaid Other

## 2022-01-30 DIAGNOSIS — M6281 Muscle weakness (generalized): Secondary | ICD-10-CM

## 2022-01-30 DIAGNOSIS — R262 Difficulty in walking, not elsewhere classified: Secondary | ICD-10-CM
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
## 2022-02-01 ENCOUNTER — Ambulatory Visit (HOSPITAL_COMMUNITY)
Admission: RE | Admit: 2022-02-01 | Discharge: 2022-02-01 | Disposition: A | Discharge status: Home / Self Care | Payer: Medicaid Other | Source: Ambulatory Visit | Attending: Vascular Surgery | Admitting: Vascular Surgery

## 2022-02-01 DIAGNOSIS — Z95828 Presence of other vascular implants and grafts: Secondary | ICD-10-CM | POA: Insufficient documentation

## 2022-02-01 MED ORDER — IOHEXOL 350 MG/ML SOLN
100.0000 mL | Freq: Once | INTRAVENOUS | Status: AC | PRN
  Administered 2022-02-01: 100 mL via INTRAVENOUS
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
## 2022-02-07 ENCOUNTER — Ambulatory Visit (INDEPENDENT_AMBULATORY_CARE_PROVIDER_SITE_OTHER): Payer: Medicaid Other | Admitting: Vascular Surgery

## 2022-02-07 ENCOUNTER — Encounter: Payer: Self-pay | Admitting: Vascular Surgery

## 2022-02-07 VITALS — BP 147/91 | HR 84 | Temp 98.1°F | Ht 74.0 in | Wt 162.7 lb

## 2022-02-07 DIAGNOSIS — Z95828 Presence of other vascular implants and grafts: Secondary | ICD-10-CM | POA: Diagnosis not present

## 2022-02-08 ENCOUNTER — Ambulatory Visit: Payer: Medicaid Other | Attending: Family

## 2022-02-08 DIAGNOSIS — M6281 Muscle weakness (generalized): Secondary | ICD-10-CM | POA: Insufficient documentation

## 2022-02-08 DIAGNOSIS — R262 Difficulty in walking, not elsewhere classified: Secondary | ICD-10-CM | POA: Insufficient documentation
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
## 2022-02-15 ENCOUNTER — Ambulatory Visit: Payer: Medicaid Other

## 2022-02-15 DIAGNOSIS — M6281 Muscle weakness (generalized): Secondary | ICD-10-CM

## 2022-02-15 DIAGNOSIS — R262 Difficulty in walking, not elsewhere classified: Secondary | ICD-10-CM
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
## 2022-02-21 ENCOUNTER — Ambulatory Visit (HOSPITAL_COMMUNITY)
Admission: EM | Admit: 2022-02-21 | Discharge: 2022-02-22 | Disposition: A | Discharge status: Home / Self Care | Payer: Medicaid Other | Attending: Nurse Practitioner | Admitting: Nurse Practitioner

## 2022-02-21 DIAGNOSIS — F6381 Intermittent explosive disorder: Secondary | ICD-10-CM | POA: Insufficient documentation

## 2022-02-21 DIAGNOSIS — F332 Major depressive disorder, recurrent severe without psychotic features: Secondary | ICD-10-CM

## 2022-02-21 DIAGNOSIS — Z20822 Contact with and (suspected) exposure to covid-19: Secondary | ICD-10-CM | POA: Insufficient documentation

## 2022-02-21 DIAGNOSIS — F319 Bipolar disorder, unspecified: Secondary | ICD-10-CM | POA: Insufficient documentation

## 2022-02-21 DIAGNOSIS — F411 Generalized anxiety disorder: Secondary | ICD-10-CM

## 2022-02-21 MED ORDER — MAGNESIUM HYDROXIDE 400 MG/5ML PO SUSP: 30.0000 mL | Freq: Every day | ORAL | Status: DC | PRN

## 2022-02-21 MED ORDER — HYDROXYZINE HCL 25 MG PO TABS: 25.0000 mg | ORAL_TABLET | Freq: Three times a day (TID) | ORAL | Status: DC | PRN

## 2022-02-21 MED ORDER — TRAZODONE HCL 50 MG PO TABS: 50.0000 mg | ORAL_TABLET | Freq: Every evening | ORAL | Status: DC | PRN

## 2022-02-21 MED ORDER — ALUM & MAG HYDROXIDE-SIMETH 200-200-20 MG/5ML PO SUSP: 30.0000 mL | ORAL | Status: DC | PRN

## 2022-02-21 MED ORDER — CITALOPRAM HYDROBROMIDE 20 MG PO TABS
20.0000 mg | ORAL_TABLET | Freq: Every day | ORAL | Status: DC
  Filled 2022-02-21: qty 1

## 2022-02-21 MED ORDER — ACETAMINOPHEN 325 MG PO TABS: 650.0000 mg | ORAL_TABLET | Freq: Four times a day (QID) | ORAL | Status: DC | PRN

## 2022-02-21 MED ORDER — AMITRIPTYLINE HCL 25 MG PO TABS: 75.0000 mg | ORAL_TABLET | Freq: Every day | ORAL | Status: DC

## 2022-02-21 MED ORDER — ATOMOXETINE HCL 40 MG PO CAPS: 40.0000 mg | ORAL_CAPSULE | Freq: Every day | ORAL | Status: DC

## 2022-02-21 MED ORDER — MELATONIN 5 MG PO TABS: 5.0000 mg | ORAL_TABLET | Freq: Every evening | ORAL | Status: DC | PRN

## 2022-02-21 MED ORDER — NICOTINE 21 MG/24HR TD PT24
21.0000 mg | MEDICATED_PATCH | Freq: Every day | TRANSDERMAL | Status: DC
  Filled 2022-02-21: qty 1
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
## 2022-02-21 NOTE — ED Triage Notes (Signed)
Pt presents to [facility] voluntarily, accompanied by [employer] with complaint of depression and lack of social support and ongoing SI, with plan to hang himself. Pt reports having an argument with his mother tonight at home, which resulted in him being kicked out of the home. Pt reports having no social supports and anywhere to go at this time. Pt reports losing his home and fiance back in January and he is still struggling with the loss. Pt currently receives medication management through [facility]. Pt reports not being consistent with prescribed medications. Pt denies HI, AVH and substance/alcohol use.

## 2022-02-22 ENCOUNTER — Ambulatory Visit (HOSPITAL_COMMUNITY): Payer: Medicaid Other | Admitting: Licensed Clinical Social Worker

## 2022-02-22 ENCOUNTER — Encounter (HOSPITAL_COMMUNITY): Payer: Self-pay

## 2022-02-22 ENCOUNTER — Encounter (HOSPITAL_COMMUNITY): Payer: Self-pay | Admitting: Emergency Medicine

## 2022-02-22 ENCOUNTER — Other Ambulatory Visit: Payer: Self-pay

## 2022-02-22 LAB — RESP PANEL BY RT-PCR (FLU A&B, COVID) ARPGX2
Influenza A by PCR: NEGATIVE
Influenza B by PCR: NEGATIVE
SARS Coronavirus 2 by RT PCR: NEGATIVE

## 2022-02-22 LAB — COMPREHENSIVE METABOLIC PANEL WITH GFR
ALT: 29 U/L (ref 0–44)
AST: 19 U/L (ref 15–41)
Albumin: 4.7 g/dL (ref 3.5–5.0)
Alkaline Phosphatase: 63 U/L (ref 38–126)
Anion gap: 12 (ref 5–15)
BUN: 11 mg/dL (ref 6–20)
CO2: 24 mmol/L (ref 22–32)
Calcium: 9.8 mg/dL (ref 8.9–10.3)
Chloride: 102 mmol/L (ref 98–111)
Creatinine, Ser: 1.06 mg/dL (ref 0.61–1.24)
GFR, Estimated: >60 mL/min (ref >60)
Glucose, Bld: 70 mg/dL (ref 70–99)
Potassium: 3.8 mmol/L (ref 3.5–5.1)
Sodium: 138 mmol/L (ref 135–145)
Total Bilirubin: 1.1 mg/dL (ref 0.3–1.2)
Total Protein: 8 g/dL (ref 6.5–8.1)

## 2022-02-22 LAB — POCT URINE DRUG SCREEN - MANUAL ENTRY (I-SCREEN)
POC Amphetamine UR: NOT DETECTED
POC Buprenorphine (BUP): NOT DETECTED
POC Cocaine UR: NOT DETECTED
POC Marijuana UR: POSITIVE — AB
POC Methadone UR: NOT DETECTED
POC Methamphetamine UR: NOT DETECTED
POC Morphine: NOT DETECTED
POC Oxazepam (BZO): NOT DETECTED
POC Oxycodone UR: NOT DETECTED
POC Secobarbital (BAR): NOT DETECTED

## 2022-02-22 LAB — CBC WITH DIFFERENTIAL/PLATELET
Abs Immature Granulocytes: 0.08 K/uL — ABNORMAL HIGH (ref 0.00–0.07)
Basophils Absolute: 0.1 K/uL (ref 0.0–0.1)
Basophils Relative: 1 %
Eosinophils Absolute: 0.1 K/uL (ref 0.0–0.5)
Eosinophils Relative: 1 %
HCT: 44.1 % (ref 39.0–52.0)
Hemoglobin: 14.9 g/dL (ref 13.0–17.0)
Immature Granulocytes: 1 %
Lymphocytes Relative: 16 %
Lymphs Abs: 2.5 K/uL (ref 0.7–4.0)
MCH: 33 pg (ref 26.0–34.0)
MCHC: 33.8 g/dL (ref 30.0–36.0)
MCV: 97.8 fL (ref 80.0–100.0)
Monocytes Absolute: 0.8 K/uL (ref 0.1–1.0)
Monocytes Relative: 5 %
Neutro Abs: 11.7 K/uL — ABNORMAL HIGH (ref 1.7–7.7)
Neutrophils Relative %: 76 %
Platelets: 262 K/uL (ref 150–400)
RBC: 4.51 MIL/uL (ref 4.22–5.81)
RDW: 12.1 % (ref 11.5–15.5)
WBC: 15.2 K/uL — ABNORMAL HIGH (ref 4.0–10.5)
nRBC: 0 % (ref 0.0–0.2)

## 2022-02-22 LAB — TSH: TSH: 0.691 u[IU]/mL (ref 0.350–4.500)

## 2022-02-22 LAB — LIPID PANEL
Cholesterol: 193 mg/dL (ref 0–200)
HDL: 45 mg/dL (ref >40)
LDL Cholesterol: 133 mg/dL — ABNORMAL HIGH (ref 0–99)
Total CHOL/HDL Ratio: 4.3 RATIO
Triglycerides: 73 mg/dL (ref <150)
VLDL: 15 mg/dL (ref 0–40)

## 2022-02-22 LAB — HEMOGLOBIN A1C
Hgb A1c MFr Bld: 4.2 % — ABNORMAL LOW (ref 4.8–5.6)
Mean Plasma Glucose: 73.84 mg/dL

## 2022-02-22 NOTE — ED Provider Notes (Signed)
FBC/OBS ASAP Discharge Summary  Date and Time: 02/22/2022 10:53 AM  Name: Christian Hartman  MRN:  [id number]   Discharge Diagnoses:  Final diagnoses:  Severe episode of recurrent major depressive disorder, without psychotic features (HCC)  GAD (generalized anxiety disorder)  Intermittent explosive disorder in adult    Subjective: Patient states "I got kicked out of my mom's house last night and I came here because I had nowhere else to go, I would like to leave because I would like to make it to my physical therapy appointment and reschedule my outpatient psychiatry appointment."  [name] is reassessed, face-to-face, by nurse practitioner.  He is seated in observation area upon my approach, no apparent distress.  He is alert and oriented, pleasant and cooperative during assessment.  Patient is insightful today regarding situation leading to his admission.  He states "I overreacted last night, I just need to think it through."  Patient reports that recent stressors include being asked to leave the home of his mother yesterday after a brief argument.  He prefers not to share details of argument.  He is able to discuss that he argues frequently with his mother however he does feel safe in his mother's home and plans to return there if he cannot reside with his aunt.  He reports he will call his aunt, Christian Hartman, and ask if he can reside with her briefly prior to returning to the home of his mother.  Patient denies suicidal and homicidal ideation.  He denies history of suicide attempts.  He endorses history of suicidal ideation reports considered plan to jump from a high building at age 31.  He denies nonsuicidal self-harm behavior.  He denies auditory and visual hallucinations.  There is no evidence of delusional thought content and no indication that patient is responding to internal stimuli.  He denies symptoms of paranoia.  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  He is followed by outpatient psychiatry and [facility].  He shares that he had an appointment with his counselor today, plans to reschedule this appointment.    No family mental health history reported.  He currently resides in [place] with his mother.  He denies access to weapons.  He is currently on his second attempt to apply for disability, has hired an Pensions consultantattorney to assist.  He endorses rare alcohol and marijuana use.  He does not discuss details of alcohol and marijuana use.  He shares that he cannot afford to purchase alcohol and marijuana.  He endorses average sleep and appetite.  Patient offered support and encouragement.  He gives verbal consent to speak with his mother, Annabelle HarmanDana phone number [phone].  Spoke with patient's mother, Annabelle HarmanDana, who denies safety concerns.  She reports she briefly argued with patient on yesterday when he refused to attend outpatient psychiatry appointment today.  She reports she is not patient's legal guardian she was assigned as legal guardian for short period of time in the past, confirms she is no longer his legal guardian.   Patient and family are educated and verbalize understanding of mental health resources and other crisis services in the community. They are instructed to call 911 and present to the nearest emergency room should patient experience any suicidal/homicidal ideation, auditory/visual/hallucinations, or detrimental worsening of mental health condition.     Stay Summary: HPI  from 02/21/2022 at 2254 pm: Christian Hartman is a 31 year old male with a past psychiatric history significant for intermittent explosive disorder, bipolar disorder, and depression who presented voluntarily to [facility] with complaints of depression and SI  with a plan to jump off a parking deck.   Patient is currently followed by the behavioral health outpatient services.   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   Patient reports "I'm here because without help, I might not do anything tonight, and I'm applying for disability, I have no friends, nothing going on, [place] the [place]'s playground, I might hurt myself or someone else by jumping off the parking deck downtown later".   Patient also endorsed homicidal ideations, and reports "not right now, it is a possibility for my mom and ex-fianc".   Patient denies audiovisual hallucinations or paranoia.   Patient reports his appetite is good and sleep is bad.   Patient reports he smokes marijuana daily and drinks a pint of alcohol 2-3 nights monthly.  Reports he smokes about a pack of cigarettes a day.  Patient denies other illicit drug use.   Patient denies access or means to a gun.  Patient reports he has access to outpatient mental health services at the [facility], but missed his last appointment a month ago.  Patient reports he is on psychiatric medications but is unable to recall the names.   Patient recalls being IVC'd by his mom at the beginning of this year but states he is unable to remember why.  Patient has a legal guardian/ who is also his mom. [facility] staff is unable to contact the patient's legal guardian/mom during this encounter.  Patient also refused to provide his mother's current phone number or contact details.   Reports encouragement and reassurance provided about ongoing stressors.  Patient provided with opportunity for questions.   On evaluation, patient is alert, oriented x 3, and cooperative. Speech is clear and coherent. Pt appears casual. Eye contact is  fair. Mood is depressed, affect is congruent with mood. Thought process and thought content is coherent. Pt endorses SI with plan/intent, endorses passive HI with no plan/intent, denies AVH. There is no indication that the patient is responding to internal stimuli. No delusions elicited during this assessment.       Total Time spent with patient: 30 minutes  Past Psychiatric History: See above Past Medical History:  Past Medical History:  Diagnosis Date   Asthma     Past Surgical History:  Procedure Laterality Date   AMPUTATION Right 12/16/2020   Procedure: PARTIAL RIGHT AMPUTATION BELOW KNEE;  Surgeon: Yolonda Kida, MD;  Location: [facility] OR;  Service: Orthopedics;  Laterality: Right;   AMPUTATION Right 12/22/2020   Procedure: RIGHT BELOW KNEE AMPUTATION;  Surgeon: Nadara Mustard, MD;  Location: [facility] OR;  Service: Orthopedics;  Laterality: Right;   APPLICATION OF WOUND VAC Right 12/22/2020   Procedure: APPLICATION OF WOUND VAC;  Surgeon: Nadara Mustard, MD;  Location: [facility] OR;  Service: Orthopedics;  Laterality: Right;   THORACIC AORTIC ENDOVASCULAR STENT GRAFT N/A 12/16/2020   Procedure: THORACIC AORTIC ENDOVASCULAR STENT GRAFT;  Surgeon: Leonie Douglas, MD;  Location: [facility] OR;  Service: Vascular;  Laterality: N/A;   Family History:  Family History  Problem Relation  Age of Onset   Congestive Heart Failure Maternal Grandfather    Diabetes Maternal Grandfather    Hypertension Maternal Grandfather    Alcohol abuse Maternal Grandfather    Diabetes Maternal Aunt    Alcohol abuse Maternal Aunt    Hypertension Maternal Grandmother    Family Psychiatric History: None reported Social History:  Social History   Substance and Sexual Activity  Alcohol Use Not Currently   Alcohol/week: 20.0 standard drinks of alcohol   Types: 20 Shots of liquor per week     Social History   Substance and Sexual Activity  Drug Use Yes   Comment: Delta 8 daily    Social History   Socioeconomic  History   Marital status: Single    Spouse name: Not on file   Number of children: 0   Years of education: 12   Highest education level: Not on file  Occupational History    Employer: [employer]  Tobacco Use   Smoking status: Every Day    Packs/day: 0.50    Types: Cigarettes, E-cigarettes   Smokeless tobacco: Never  Vaping Use   Vaping Use: Never used  Substance and Sexual Activity   Alcohol use: Not Currently    Alcohol/week: 20.0 standard drinks of alcohol    Types: 20 Shots of liquor per week   Drug use: Yes    Comment: Delta 8 daily   Sexual activity: Not Currently  Other Topics Concern   Not on file  Social History Narrative   ** Merged History Encounter **       ** Merged History Encounter **   Patient lives at home with his mother Annabelle Harman).    Right handed   Patient is not working right now.   Caffeine- sodas two        Social Determinants of Health   Financial Resource Strain: High Risk (09/20/2021)   Overall Financial Resource Strain (CARDIA)    Difficulty of Paying Living Expenses: Very hard  Food Insecurity: Food Insecurity Present (09/20/2021)   Hunger Vital Sign    Worried About Running Out of Food in the Last Year: Sometimes true    Ran Out of Food in the Last Year: Sometimes true  Transportation Needs: Unmet Transportation Needs (09/20/2021)   PRAPARE - Transportation    Lack of Transportation (Medical): No    Lack of Transportation (Non-Medical): Yes  Physical Activity: Inactive (09/20/2021)   Exercise Vital Sign    Days of Exercise per Week: 0 days    Minutes of Exercise per Session: 0 min  Stress: Stress Concern Present (09/20/2021)   [employer] of Occupational Health - Occupational Stress Questionnaire    Feeling of Stress : Very much  Social Connections: Socially Isolated (09/20/2021)   Social Connection and Isolation Panel [NHANES]    Frequency of Communication with Friends and Family: Never    Frequency of Social Gatherings with  Friends and Family: Never    Attends Religious Services: Never    Database administrator or Organizations: No    Attends Banker Meetings: Never    Marital Status: Separated   SDOH:  SDOH Screenings   Food Insecurity: Food Insecurity Present (09/20/2021)  Housing: High Risk (09/20/2021)  Transportation Needs: Unmet Transportation Needs (09/20/2021)  Alcohol Screen: High Risk (09/20/2021)  Depression (PHQ2-9): High Risk (01/25/2022)  Financial Resource Strain: High Risk (09/20/2021)  Physical Activity: Inactive (09/20/2021)  Social Connections: Socially Isolated (09/20/2021)  Stress: Stress Concern Present (09/20/2021)  Tobacco Use: High  Risk (02/22/2022)    Tobacco Cessation:  A prescription for an FDA-approved tobacco cessation medication was offered at discharge and the patient refused  Current Medications:  Current Facility-Administered Medications  Medication Dose Route Frequency Provider Last Rate Last Admin   acetaminophen (TYLENOL) tablet 650 mg  650 mg Oral Q6H PRN Onuoha, Chinwendu V, NP       alum & mag hydroxide-simeth (MAALOX/MYLANTA) 200-200-20 MG/5ML suspension 30 mL  30 mL Oral Q4H PRN Onuoha, Chinwendu V, NP       amitriptyline (ELAVIL) tablet 75 mg  75 mg Oral QHS Onuoha, Chinwendu V, NP       citalopram (CELEXA) tablet 20 mg  20 mg Oral Daily Onuoha, Chinwendu V, NP       hydrOXYzine (ATARAX) tablet 25 mg  25 mg Oral TID PRN Onuoha, Chinwendu V, NP       magnesium hydroxide (MILK OF MAGNESIA) suspension 30 mL  30 mL Oral Daily PRN Onuoha, Chinwendu V, NP       melatonin tablet 5 mg  5 mg Oral QHS PRN Onuoha, Chinwendu V, NP       nicotine (NICODERM CQ - dosed in mg/24 hours) patch 21 mg  21 mg Transdermal Q0600 Onuoha, Chinwendu V, NP       Current Outpatient Medications  Medication Sig Dispense Refill   amitriptyline (ELAVIL) 75 MG tablet Take 1 tablet (75 mg total) by mouth at bedtime. 30 tablet 1   citalopram (CELEXA) 20 MG tablet Take 1 tablet (20 mg  total) by mouth daily. 30 tablet 1   hydrOXYzine (ATARAX) 10 MG tablet Take 1 tablet (10 mg total) by mouth 3 (three) times daily as needed. (Patient taking differently: Take 10 mg by mouth 3 (three) times daily as needed for anxiety.) 75 tablet 1    PTA Medications: (Not in a hospital admission)      01/25/2022    1:25 PM 10/12/2021    4:26 PM 09/20/2021    1:21 PM  Depression screen PHQ 2/9  Decreased Interest Down, Depressed, Hopeless PHQ - 2 Score Altered sleeping Tired, decreased energy Change in appetite Feeling bad or failure about yourself  Trouble concentrating Moving slowly or fidgety/restless 1 1 0  Suicidal thoughts PHQ-9 Score Difficult doing work/chores Extremely dIfficult Extremely dIfficult Extremely dIfficult    Flowsheet Row ED from 02/21/2022 in [facility] Video Visit from 01/25/2022 in [facility] Office Visit from 10/12/2021 in [facility]  C-SSRS RISK CATEGORY High Risk Moderate Risk Moderate Risk       Musculoskeletal  Strength & Muscle Tone: within normal limits Gait & Station: normal Patient leans: N/A  Psychiatric Specialty Exam  Presentation  General Appearance: Appropriate for Environment; Casual  Eye Contact:Good  Speech:Clear and Coherent; Normal Rate  Speech Volume:Normal  Handedness:Right   Mood and Affect  Mood:Euthymic  Affect:Appropriate; Congruent   Thought Process  Thought Processes:Coherent; Goal Directed; Linear  Descriptions of Associations:Intact  Orientation:Full (Time, Place and Person)  Thought Content:Logical; WDL  Diagnosis of Schizophrenia or Schizoaffective disorder in past: No    Hallucinations:Hallucinations: None  Ideas of Reference:None  Suicidal Thoughts:Suicidal Thoughts: No SI Active Intent and/or Plan: With Plan; With Intent  Homicidal  Thoughts:Homicidal Thoughts:  No HI Passive Intent and/or Plan: Without Plan; Without Intent   Sensorium  Memory:Immediate Good; Recent Good  Judgment:Fair  Insight:Fair   Executive Functions  Concentration:Good  Attention Span:Good  Recall:Good  Fund of Knowledge:Good  Language:Good   Psychomotor Activity  Psychomotor Activity:Psychomotor Activity: Normal   Assets  Assets:Communication Skills; Desire for Improvement; Financial Resources/Insurance; Housing; Intimacy; Leisure Time; Physical Health; Resilience; Social Support   Sleep  Sleep:Sleep: Fair   Nutritional Assessment (For OBS and FBC admissions only) Has the patient had a weight loss or gain of 10 pounds or more in the last 3 months?: No Has the patient had a decrease in food intake/or appetite?: No Does the patient have dental problems?: No Does the patient have eating habits or behaviors that may be indicators of an eating disorder including binging or inducing vomiting?: No Has the patient recently lost weight without trying?: 0 Has the patient been eating poorly because of a decreased appetite?: 0 Malnutrition Screening Tool Score: 0    Physical Exam  Physical Exam Vitals and nursing note reviewed.  Constitutional:      Appearance: Normal appearance. He is well-developed and normal weight.  HENT:     Head: Normocephalic and atraumatic.     Nose: Nose normal.  Cardiovascular:     Rate and Rhythm: Normal rate.  Pulmonary:     Effort: Pulmonary effort is normal.  Musculoskeletal:        General: Normal range of motion.     Cervical back: Normal range of motion.     Comments: HX Right -BKA  Skin:    General: Skin is warm and dry.  Neurological:     Mental Status: He is alert and oriented to person, place, and time.  Psychiatric:        Attention and Perception: Attention and perception normal.        Mood and Affect: Mood and affect normal.        Speech: Speech normal.        Behavior:  Behavior normal. Behavior is cooperative.        Thought Content: Thought content normal.        Cognition and Memory: Cognition and memory normal.        Judgment: Judgment normal.    Review of Systems  Constitutional: Negative.   HENT: Negative.    Eyes: Negative.   Respiratory: Negative.    Cardiovascular: Negative.   Gastrointestinal: Negative.   Genitourinary: Negative.   Musculoskeletal: Negative.   Skin: Negative.   Neurological: Negative.   Psychiatric/Behavioral: Negative.     Blood pressure 131/84, pulse 92, temperature 98.2 F (36.8 C), temperature source Oral, resp. rate 16, SpO2 100 %. There is no height or weight on file to calculate BMI.  Demographic Factors:  Male and Unemployed  Loss Factors: NA  Historical Factors: NA  Risk Reduction Factors:   Sense of responsibility to family, Living with another person, especially a relative, Positive social support, Positive therapeutic relationship, and Positive coping skills or problem solving skills  Continued Clinical Symptoms:  Previous Psychiatric Diagnoses and Treatments Medical Diagnoses and Treatments/Surgeries  Cognitive Features That Contribute To Risk:  None    Suicide Risk:  Minimal: No identifiable suicidal ideation.  Patients presenting with no risk factors but with morbid ruminations; may be classified as minimal risk based on the severity of the depressive symptoms  Plan Of Care/Follow-up recommendations:  Patient reviewed with Dr. Gretta Cool. Follow-up with established outpatient psychiatry at [facility]  behavioral health. Continue current medications. Medications: -Amitriptyline 75 mg nightly -Citalopram 20 mg daily -Hydroxyzine 10 mg 3 times daily as needed/anxiety   Disposition: Discharge  Lucky Rathke, FNP 02/22/2022, 10:53 AM

## 2022-02-22 NOTE — Discharge Instructions (Signed)

## 2022-02-22 NOTE — ED Notes (Signed)
Patient discharged.

## 2022-02-22 NOTE — ED Notes (Signed)
Pt with rt prosthetic leg, presents with suicidal ideation, plan to hang self  Kicked out of  home after argument with mother.  Monitoring for safety.

## 2022-02-22 NOTE — ED Notes (Signed)
Pt sleeping at present, no distress noted.  Monitoring for safety. 

## 2022-02-22 NOTE — ED Notes (Signed)
Pt is currently sleeping. In view of nursing station. Breathing even and unlabored.

## 2022-02-22 NOTE — ED Notes (Signed)
Pt is awake and alert.  Denies SI, HI or AVH. Asking to speak with a provider. States he has two doctor's appointments today and would like to be discharged.

## 2022-02-23 ENCOUNTER — Telehealth (HOSPITAL_COMMUNITY): Payer: Self-pay

## 2022-02-23 NOTE — BH Assessment (Signed)
Care Management - [place] Follow Up Discharges   Writer attempted to make contact with patient today and was unsuccessful.  Writer left a HIPPA compliant voice message.   Per chart review, patient has a follow up appointment at [facility] with Heide Spark on 05-09-22

## 2022-02-28 ENCOUNTER — Encounter (HOSPITAL_COMMUNITY): Payer: Self-pay | Admitting: Physician Assistant

## 2022-02-28 ENCOUNTER — Telehealth (INDEPENDENT_AMBULATORY_CARE_PROVIDER_SITE_OTHER): Payer: Medicaid Other | Admitting: Physician Assistant

## 2022-02-28 DIAGNOSIS — R41840 Attention and concentration deficit: Secondary | ICD-10-CM | POA: Diagnosis not present

## 2022-02-28 DIAGNOSIS — F4323 Adjustment disorder with mixed anxiety and depressed mood: Secondary | ICD-10-CM

## 2022-02-28 DIAGNOSIS — G479 Sleep disorder, unspecified: Secondary | ICD-10-CM

## 2022-02-28 DIAGNOSIS — F411 Generalized anxiety disorder: Secondary | ICD-10-CM | POA: Diagnosis not present

## 2022-02-28 DIAGNOSIS — F6381 Intermittent explosive disorder: Secondary | ICD-10-CM | POA: Diagnosis not present

## 2022-02-28 MED ORDER — HYDROXYZINE HCL 10 MG PO TABS: 10.0000 mg | ORAL_TABLET | Freq: Three times a day (TID) | ORAL | 1 refills | Status: AC | PRN

## 2022-02-28 MED ORDER — AMITRIPTYLINE HCL 50 MG PO TABS: ORAL_TABLET | ORAL | 0 refills | Status: DC

## 2022-02-28 MED ORDER — RAMELTEON 8 MG PO TABS: 8.0000 mg | ORAL_TABLET | Freq: Every day | ORAL | 2 refills | Status: AC

## 2022-02-28 NOTE — Progress Notes (Signed)
BH MD/PA/NP OP Progress Note  Virtual Visit via Telephone Note  I connected with Christian Hartman on 02/28/22 at  1:00 PM EDT by telephone and verified that I am speaking with the correct person using two identifiers.  Location: Patient: Home Provider: Clinic   I discussed the limitations, risks, security and privacy concerns of performing an evaluation and management service by telephone and the availability of in person appointments. I also discussed with the patient that there may be a patient responsible charge related to this service. The patient expressed understanding and agreed to proceed.  Follow Up Instructions:   I discussed the assessment and treatment plan with the patient. The patient was provided an opportunity to ask questions and all were answered. The patient agreed with the plan and demonstrated an understanding of the instructions.   The patient was advised to call back or seek an in-person evaluation if the symptoms worsen or if the condition fails to improve as anticipated.  I provided 23 minutes of non-face-to-face time during this encounter.  Meta Hatchet, PA   02/28/2022 6:30 PM Leshon Aran Menning  MRN:  [id number]  Chief Complaint:  No chief complaint on file.  HPI:   Christian Hartman is a 31 year old male with a past psychiatric history significant for adjustment disorder with mixed anxiety and depressed mood and generalized anxiety disorder who presents to [facility] via virtual telephone visit for follow-up and medication management.  Patient is currently being managed on the following medications:  Hydroxyzine 10 mg 3 times daily as needed Amitriptyline 50 mg at bedtime Celexa 20 mg daily  Provider discussed with patient his recent visit to [facility].  Patient states that he presented to [facility] after succumbing to an anxiety attack brought upon by an argument with his  family.  Patient states that he presented to the facility voluntarily and was admitted for continuous observation.  During the time of the incident, patient forgot that he had access to hydroxyzine for the management of his anxiety.  Per chart review, patient presented voluntarily to [facility] on 02/21/2022 due to depression and suicidal ideations with a plan to jump off a parking deck.  During the assessment, patient states that he presented to the facility voluntarily and after being kicked out of his current place of residence.  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  He further added that due to his current standing in life, he was fearful that he might hurt someone if he attempted to jump off the parking deck located down town. Patient also expressed some homicidal ideations towards his mother and ex-fianc.  Patient was admitted for continuous observation and discharged the following day.  Since being discharged from [facility], patient notes that he has not had any changes in his sleep quality.  He also reports that the past few days have been crazy for him and he has not been able to refill his prescriptions.  Patient endorses depressive episodes characterized by the following symptoms: irritability, hopelessness, uselessness, and continuing to be hard on himself.  Patient also endorses elevated anxiety and rates his anxiety an 8 or 9 out of 10.  Patient stressors include family and trying to find a place for himself.  A GAD-7 screen was performed with the patient scoring a 9.  A PHQ-9 screen was also performed with the  patient scoring a 12.  A [place] Suicide Severity Rating Scale was performed with the patient being considered moderate risk.  Patient is alert and oriented x4, calm, cooperative, and fully engaged in conversation during the encounter.  Patient endorses neutral mood.  Patient denies suicidal  or homicidal ideations.  He further denies auditory or visual hallucinations and does not appear to be responding to internal/external stimuli.  Patient endorses fair sleep and receives on average 5 to 6 hours of sleep each night.  Patient endorses poor appetite needs on average 1 meal per day.  Patient endorses alcohol consumption on occasion.  Patient endorses tobacco use and smokes on average 10 cigarettes/day.  Patient endorses illicit drug use in the form of CBD pre-rolls  Visit Diagnosis:    ICD-10-CM   1. Attention and concentration deficit  R41.840     2. Adjustment disorder with mixed anxiety and depressed mood  F43.23 amitriptyline (ELAVIL) 50 MG tablet    3. Generalized anxiety disorder  F41.1 hydrOXYzine (ATARAX) 10 MG tablet    4. Intermittent explosive disorder in adult  F63.81     5. Sleep disturbances  G47.9 ramelteon (ROZEREM) 8 MG tablet      Past Psychiatric History:  Adjustment disorder with mixed anxiety and depressed mood Generalized anxiety disorder Intermittent explosive disorder in adult  Past Medical History:  Past Medical History:  Diagnosis Date   Asthma     Past Surgical History:  Procedure Laterality Date   AMPUTATION Right 12/16/2020   Procedure: PARTIAL RIGHT AMPUTATION BELOW KNEE;  Surgeon: Yolonda Kida, MD;  Location: [facility] OR;  Service: Orthopedics;  Laterality: Right;   AMPUTATION Right 12/22/2020   Procedure: RIGHT BELOW KNEE AMPUTATION;  Surgeon: Nadara Mustard, MD;  Location: [facility] OR;  Service: Orthopedics;  Laterality: Right;   APPLICATION OF WOUND VAC Right 12/22/2020   Procedure: APPLICATION OF WOUND VAC;  Surgeon: Nadara Mustard, MD;  Location: [facility] OR;  Service: Orthopedics;  Laterality: Right;   THORACIC AORTIC ENDOVASCULAR STENT GRAFT N/A 12/16/2020   Procedure: THORACIC AORTIC ENDOVASCULAR STENT GRAFT;  Surgeon: Leonie Douglas, MD;  Location: [facility] OR;  Service: Vascular;  Laterality: N/A;    Family Psychiatric History:  Patient  denies family history of psychiatric illness  Family History:  Family History  Problem Relation Age of Onset   Congestive Heart Failure Maternal Grandfather    Diabetes Maternal Grandfather    Hypertension Maternal Grandfather    Alcohol abuse Maternal Grandfather    Diabetes Maternal Aunt    Alcohol abuse Maternal Aunt    Hypertension Maternal Grandmother     Social History:  Social History   Socioeconomic History   Marital status: Single    Spouse name: Not on file   Number of children: 0   Years of education: 12   Highest education level: Not on file  Occupational History    Employer: [employer]  Tobacco Use   Smoking status: Every Day    Packs/day: 0.50    Types: Cigarettes, E-cigarettes   Smokeless tobacco: Never  Vaping Use   Vaping Use: Never used  Substance and Sexual Activity   Alcohol use: Not Currently    Alcohol/week: 20.0 standard drinks of alcohol    Types: 20 Shots of liquor per week   Drug use: Yes    Comment: Delta 8 daily   Sexual activity: Not Currently  Other Topics Concern   Not on file  Social History Narrative   ** Merged History  Encounter **       ** Merged History Encounter **   Patient lives at home with his mother Annabelle Harman(Dana).    Right handed   Patient is not working right now.   Caffeine- sodas two        Social Determinants of Health   Financial Resource Strain: High Risk (09/20/2021)   Overall Financial Resource Strain (CARDIA)    Difficulty of Paying Living Expenses: Very hard  Food Insecurity: Food Insecurity Present (09/20/2021)   Hunger Vital Sign    Worried About Running Out of Food in the Last Year: Sometimes true    Ran Out of Food in the Last Year: Sometimes true  Transportation Needs: Unmet Transportation Needs (09/20/2021)   PRAPARE - Transportation    Lack of Transportation (Medical): No    Lack of Transportation (Non-Medical): Yes  Physical Activity: Inactive (09/20/2021)   Exercise Vital Sign    Days of  Exercise per Week: 0 days    Minutes of Exercise per Session: 0 min  Stress: Stress Concern Present (09/20/2021)   [employer] - Occupational Stress Questionnaire    Feeling of Stress : Very much  Social Connections: Socially Isolated (09/20/2021)   Social Connection and Isolation Panel [NHANES]    Frequency of Communication with Friends and Family: Never    Frequency of Social Gatherings with Friends and Family: Never    Attends Religious Services: Never    Database administratorActive Member of Clubs or Organizations: No    Attends Engineer, structuralClub or Organization Meetings: Never    Marital Status: Separated    Allergies: No Known Allergies  Metabolic Disorder Labs: Lab Results  Component Value Date   HGBA1C 4.2 (L) 02/21/2022   MPG 73.84 02/21/2022   No results found for: "PROLACTIN" Lab Results  Component Value Date   CHOL 193 02/21/2022   TRIG 73 02/21/2022   HDL 45 02/21/2022   CHOLHDL 4.3 02/21/2022   VLDL 15 02/21/2022   LDLCALC 133 (H) 02/21/2022   Lab Results  Component Value Date   TSH 0.691 02/21/2022    Therapeutic Level Labs: No results found for: "LITHIUM" No results found for: "VALPROATE" No results found for: "CBMZ"  Current Medications: Current Outpatient Medications  Medication Sig Dispense Refill   ramelteon (ROZEREM) 8 MG tablet Take 1 tablet (8 mg total) by mouth at bedtime. 30 tablet 2   amitriptyline (ELAVIL) 50 MG tablet Patient to take 1 tablet (50 mg total) for 3 days, followed by half tablet (25 mg total) for 3 more days before discontinuing the medication 5 tablet 0   citalopram (CELEXA) 20 MG tablet Take 1 tablet (20 mg total) by mouth daily. 30 tablet 1   hydrOXYzine (ATARAX) 10 MG tablet Take 1 tablet (10 mg total) by mouth 3 (three) times daily as needed for anxiety. 75 tablet 1   No current facility-administered medications for this visit.     Musculoskeletal: Strength & Muscle Tone: Unable to assess due to telemedicine visit Gait &  Station: Unable to assess due to telemedicine visit Patient leans: Unable to assess due to telemedicine visit  Psychiatric Specialty Exam: Review of Systems  Psychiatric/Behavioral:  Positive for sleep disturbance. Negative for decreased concentration, dysphoric mood, hallucinations, self-injury and suicidal ideas. The patient is nervous/anxious. The patient is not hyperactive.     There were no vitals taken for this visit.There is no height or weight on file to calculate BMI.  General Appearance: Unable to assess due to telemedicine visit  Eye Contact:  Unable to assess due to telemedicine visit  Speech:  Clear and Coherent and Normal Rate  Volume:  Normal  Mood:  Anxious and Depressed  Affect:  Congruent  Thought Process:  Coherent, Goal Directed, and Descriptions of Associations: Intact  Orientation:  Full (Time, Place, and Person)  Thought Content: WDL   Suicidal Thoughts:  No  Homicidal Thoughts:  No  Memory:  Immediate;   Good Recent;   Good Remote;   Fair  Judgement:  Good  Insight:  Fair  Psychomotor Activity:  Normal  Concentration:  Concentration: Good and Attention Span: Good  Recall:  Fair  Fund of Knowledge: Good  Language: Good  Akathisia:  No  Handed:  Right  AIMS (if indicated): not done  Assets:  Communication Skills Desire for Improvement Housing  ADL's:  Intact  Cognition: WNL  Sleep:  Fair   Screenings: AUDIT    Advertising copywriter from 09/20/2021 in [facility]  Alcohol Use Disorder Identification Test Final Score (AUDIT) 16      GAD-7    Flowsheet Row Video Visit from 02/28/2022 in [facility] Video Visit from 01/25/2022 in [facility] Office Visit from 10/12/2021 in [facility] Counselor from 09/20/2021 in [facility] Office Visit from 08/31/2021 in [facility]  Total GAD-7  Score 9 8 14 18 14       PHQ2-9    Flowsheet Row Video Visit from 02/28/2022 in [facility] Video Visit from 01/25/2022 in [facility] Office Visit from 10/12/2021 in [facility] Counselor from 09/20/2021 in [facility] Office Visit from 08/31/2021 in [facility]  PHQ-2 Total Score 3 5 5 6 6   PHQ-9 Total Score 12 18 17 21 21       Flowsheet Row Video Visit from 02/28/2022 in [facility] from 02/21/2022 in [facility] Video Visit from 01/25/2022 in [facility]  C-SSRS RISK CATEGORY Moderate Risk High Risk Moderate Risk        Assessment and Plan:   Stacie A. Bobrowski is a 31 year old male with a past psychiatric history significant for adjustment disorder with mixed anxiety and depressed mood and generalized anxiety disorder who presents to [facility] via virtual telephone visit for follow-up and medication management.  Patient was recently involuntarily admitted to [facility] due to worsening depressive symptoms and suicidal ideations.  Since being discharged from the facility, patient continues to express some depressive symptoms as well as anxiety.  Patient's current stressors include family and trying to find a place to live.  Patient also expresses that he has been having issues with sleep.  Patient to be discontinued off his amitriptyline.  Before discontinuing his amitriptyline, patient to taper off the medication.  Patient to take amitriptyline 50 mg for 3 days followed by 25 mg for 3 more days before discontinuing.  Patient to be placed on ramelteon 8 mg at bedtime for the management of his sleep disturbances.  Patient was agreeable to recommendation.  Patient's medications to be e-prescribed to pharmacy of choice.  Collaboration of  Care: Collaboration of Care: Medication Management AEB provider managing patient's psychiatric medications and Psychiatrist AEB patient being followed by mental health provider  Patient/Guardian was advised Release of Information must be obtained prior to  any record release in order to collaborate their care with an outside provider. Patient/Guardian was advised if they have not already done so to contact the registration department to sign all necessary forms in order for [place] to release information regarding their care.   Consent: Patient/Guardian gives verbal consent for treatment and assignment of benefits for services provided during this visit. Patient/Guardian expressed understanding and agreed to proceed.   1. Adjustment disorder with mixed anxiety and depressed mood  - amitriptyline (ELAVIL) 50 MG tablet; Patient to take 1 tablet (50 mg total) for 3 days, followed by half tablet (25 mg total) for 3 more days before discontinuing the medication  Dispense: 5 tablet; Refill: 0 - citalopram (CELEXA) 20 MG tablet; Take 1 tablet (20 mg total) by mouth daily.  Dispense: 30 tablet; Refill: 1  2. Attention and concentration deficit   3. Generalized anxiety disorder  - hydrOXYzine (ATARAX) 10 MG tablet; Take 1 tablet (10 mg total) by mouth 3 (three) times daily as needed for anxiety.  Dispense: 75 tablet; Refill: 1  4. Intermittent explosive disorder in adult   5. Sleep disturbances  - ramelteon (ROZEREM) 8 MG tablet; Take 1 tablet (8 mg total) by mouth at bedtime.  Dispense: 30 tablet; Refill: 2   Patient to follow up in 5 weeks Provider spent a total of 23 minutes with the patient/reviewing patient's chart  Malachy Mood, PA 02/28/2022, 6:30 PM

## 2022-03-01 ENCOUNTER — Ambulatory Visit: Payer: Medicaid Other | Admitting: Physical Therapy

## 2022-03-01 ENCOUNTER — Encounter: Payer: Self-pay | Admitting: Physical Therapy

## 2022-03-01 DIAGNOSIS — M6281 Muscle weakness (generalized): Secondary | ICD-10-CM

## 2022-03-01 DIAGNOSIS — R262 Difficulty in walking, not elsewhere classified: Secondary | ICD-10-CM
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
## 2022-03-02 MED ORDER — CITALOPRAM HYDROBROMIDE 20 MG PO TABS: 20.0000 mg | ORAL_TABLET | Freq: Every day | ORAL | 1 refills | Status: DC

## 2022-03-03 ENCOUNTER — Telehealth (HOSPITAL_COMMUNITY): Payer: Self-pay | Admitting: *Deleted

## 2022-03-03 NOTE — Telephone Encounter (Signed)
Called [facility] [employer] to follow up on the PA submitted for [place]. It was denied because he has to have failed two of the preferred meds which are zopidem, temazepam and Flurazepam. WIll notify the provider.

## 2022-03-08 ENCOUNTER — Ambulatory Visit: Payer: Medicaid Other | Attending: Family | Admitting: Physical Therapy

## 2022-03-08 ENCOUNTER — Encounter: Payer: Self-pay | Admitting: Physical Therapy

## 2022-03-08 DIAGNOSIS — M6281 Muscle weakness (generalized): Secondary | ICD-10-CM | POA: Insufficient documentation

## 2022-03-08 DIAGNOSIS — R262 Difficulty in walking, not elsewhere classified: Secondary | ICD-10-CM | POA: Diagnosis present
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
## 2022-03-15 ENCOUNTER — Ambulatory Visit: Payer: Medicaid Other

## 2022-03-15 DIAGNOSIS — M6281 Muscle weakness (generalized): Secondary | ICD-10-CM | POA: Diagnosis not present

## 2022-03-15 DIAGNOSIS — R262 Difficulty in walking, not elsewhere classified: Secondary | ICD-10-CM
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
## 2022-03-22 ENCOUNTER — Encounter: Payer: Self-pay | Admitting: Physical Therapy

## 2022-03-22 ENCOUNTER — Ambulatory Visit: Payer: Medicaid Other | Admitting: Physical Therapy

## 2022-03-22 DIAGNOSIS — M6281 Muscle weakness (generalized): Secondary | ICD-10-CM

## 2022-03-22 DIAGNOSIS — R262 Difficulty in walking, not elsewhere classified: Secondary | ICD-10-CM
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
## 2022-03-29 ENCOUNTER — Ambulatory Visit: Payer: Medicaid Other

## 2022-04-05 ENCOUNTER — Ambulatory Visit: Payer: Medicaid Other | Attending: Family

## 2022-04-05 DIAGNOSIS — R262 Difficulty in walking, not elsewhere classified: Secondary | ICD-10-CM | POA: Diagnosis present

## 2022-04-05 DIAGNOSIS — M6281 Muscle weakness (generalized): Secondary | ICD-10-CM | POA: Diagnosis not present
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
## 2022-04-24 ENCOUNTER — Ambulatory Visit (INDEPENDENT_AMBULATORY_CARE_PROVIDER_SITE_OTHER): Payer: Medicaid Other | Admitting: Orthopedic Surgery

## 2022-04-24 DIAGNOSIS — Z89511 Acquired absence of right leg below knee: Secondary | ICD-10-CM

## 2022-05-09 ENCOUNTER — Encounter: Payer: Self-pay | Admitting: Orthopedic Surgery

## 2022-05-09 ENCOUNTER — Ambulatory Visit (INDEPENDENT_AMBULATORY_CARE_PROVIDER_SITE_OTHER): Payer: Medicaid Other | Admitting: Licensed Clinical Social Worker

## 2022-05-09 DIAGNOSIS — F6381 Intermittent explosive disorder: Secondary | ICD-10-CM

## 2022-05-09 DIAGNOSIS — F4323 Adjustment disorder with mixed anxiety and depressed mood: Secondary | ICD-10-CM
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
## 2022-05-09 NOTE — Progress Notes (Signed)
THERAPIST PROGRESS NOTE  Session Time: 4  Participation Level: Active  Behavioral Response: Fairly Groomed and GuardedAlertAngry and Irritable  Type of Therapy: Individual Therapy  Treatment Goals addressed: Patient will participate in at least 80% of scheduled individual psychotherapy sessions   ProgressTowards Goals: Not Progressing as evidenced by patient no showing last session  Interventions: Supportive and Reframing  Summary: Christian Hartman is a 31 y.o. male who presents with irritable, depressed, and anxious mood\affect.  Patient was resistant throughout therapy session and was argumentative.  Patient came in today stating that "everything is pointless".  This was due to the news that he got that he could not to get a new prosthetic that would make him be able to run and jump and be able to engage in activities such as dancing and skateboarding.  Patient reports that "unless I am an athlete I would not be eligible to get that prosthetic".    LCSW spoke with patient about different hobbies.  Patient stated "I already know that I will not like any of them".  LCSW spoke with patient about different hobbies that he could engage in.  Patient disagreed with LCSW when LCSW stated "You  miss 100% of the shots you do not take". Nilson reported to LCSW "that is the stupid shit I have ever heard".  Patient went on to utilize an example of Twilight the movie and the books and how they were stupid.  When the movie came out him and his friend thought that the movie was going to be stupid but gave it a chance and then stated to LCSW "guess what the movie was stupid". Patient reported that there is no point in  putting effort into something if He knows that he not like it.  [name]  went on to state that the only reason that he is here today is because his mom is making him in order to stay at her house.  LCSW spoke with patient about the right to self-determination and how it is his choice to  engage in therapy. LCSW stated  if he chooses not to engage in therapy he is not would be unable to make progress and that could result in a discharge from therapy.   Patient then stated "I am here because I want to be here".  LCSW praised patient for for his honesty.  [name] took it as "throwing it in his face". LCSW disagreed and stated that he appreciated his honesty genuinely.  Patient then stated "then were both fucking lying".  Patient then proceeded to walk out of providers office, LCSW did provide patient the option to reschedule appointment patient proceeded to put his headphones and walked out of the office.  Suicidal/Homicidal: Nowithout intent/plan  Therapist Response:    Intervention/Plan: LCSW psychoanalytic therapy for patient to express thoughts, feelings and concerns.  LCSW use reframing in session.  LCSW used open-ended questions and reflective listening for motivational interviewing.  Plan: Patient chose to walk out of therapy today due to disagreement with LCSW about reasoning for being here at therapy  Diagnosis: Intermittent explosive disorder in adult  Adjustment disorder with mixed anxiety and depressed mood  Collaboration of Care: Other None today   Patient/Guardian was advised Release of Information must be obtained prior to any record release in order to collaborate their care with an outside provider. Patient/Guardian was advised if they have not already done so to contact the registration department to sign all necessary forms in order for [place]  to release information regarding their care.   Consent: Patient/Guardian gives verbal consent for treatment and assignment of benefits for services provided during this visit. Patient/Guardian expressed understanding and agreed to proceed.   Weber Cooks, LCSW 05/09/2022

## 2022-05-10 ENCOUNTER — Telehealth (HOSPITAL_COMMUNITY): Payer: Self-pay | Admitting: Licensed Clinical Social Worker

## 2022-05-10 NOTE — Telephone Encounter (Signed)
Patient came into the office requesting a new provider. Patient states he don't personally like current therapist. He feel that his needs were not met. Patient states that his statement were lies coming from the therapist , and he fill that he wa disliked.

## 2022-06-14 ENCOUNTER — Ambulatory Visit (HOSPITAL_COMMUNITY): Payer: Medicaid Other | Admitting: Licensed Clinical Social Worker

## 2022-06-15 ENCOUNTER — Telehealth (INDEPENDENT_AMBULATORY_CARE_PROVIDER_SITE_OTHER): Payer: Medicaid Other | Admitting: Physician Assistant

## 2022-06-15 ENCOUNTER — Encounter (HOSPITAL_COMMUNITY): Payer: Self-pay | Admitting: Physician Assistant

## 2022-06-15 DIAGNOSIS — R41840 Attention and concentration deficit: Secondary | ICD-10-CM

## 2022-06-15 DIAGNOSIS — F6381 Intermittent explosive disorder: Secondary | ICD-10-CM

## 2022-06-15 DIAGNOSIS — F411 Generalized anxiety disorder: Secondary | ICD-10-CM

## 2022-06-15 DIAGNOSIS — F4323 Adjustment disorder with mixed anxiety and depressed mood: Secondary | ICD-10-CM | POA: Diagnosis not present

## 2022-06-15 MED ORDER — CITALOPRAM HYDROBROMIDE 20 MG PO TABS: 20.0000 mg | ORAL_TABLET | Freq: Every day | ORAL | 1 refills | Status: DC

## 2022-06-15 MED ORDER — ATOMOXETINE HCL 40 MG PO CAPS: 40.0000 mg | ORAL_CAPSULE | Freq: Every day | ORAL | 1 refills | Status: AC

## 2022-06-15 NOTE — Progress Notes (Signed)
BH MD/PA/NP OP Progress Note  Virtual Visit via Telephone Note  I connected with Christian Hartman on 06/15/22 at  2:00 PM EST by telephone and verified that I am speaking with the correct person using two identifiers.  Location: Patient: Home Provider: Clinic   I discussed the limitations, risks, security and privacy concerns of performing an evaluation and management service by telephone and the availability of in person appointments. I also discussed with the patient that there may be a patient responsible charge related to this service. The patient expressed understanding and agreed to proceed.  Follow Up Instructions:   I discussed the assessment and treatment plan with the patient. The patient was provided an opportunity to ask questions and all were answered. The patient agreed with the plan and demonstrated an understanding of the instructions.   The patient was advised to call back or seek an in-person evaluation if the symptoms worsen or if the condition fails to improve as anticipated.  I provided 25 minutes of non-face-to-face time during this encounter.  Malachy Mood, PA   06/15/2022 7:27 PM Christian Hartman  MRN:  [id number]  Chief Complaint:  Chief Complaint  Patient presents with   Follow-up   Medication Management   HPI:   Christian Hartman is a 32 year old, African-American male with a past psychiatric history significant for adjustment disorder with mixed anxiety and depressed mood, generalized anxiety disorder, and intermittent explosive disorder and adult, and attention and concentration deficits who presents to [facility] via virtual telephone visit for follow-up and medication management.  Patient was last seen by this provider on 02/28/2022.  Patient is currently being managed on the following medications:  Ramelteon 8 mg at bedtime Hydroxyzine 10 mg 3 times daily as needed Citalopram 20 mg daily  Patient  reports that he has no issues with the use of his citalopram except that he almost always uses the bathroom in the morning upon waking up.  Patient reports that his mood has been relatively good for the past few weeks.  He reports that ramelteon has not been helpful with his sleep and also states hydroxyzine has not been helpful in managing his anxiety.  Patient is interested in medication options for issues with attention and concentration.  Patient states that he was on Adderall and Ritalin several years ago while in school.    Patient reports that he has been trying to engage in creative outlets such as art and reading in an effort to help with his mental health; however, he is unable to focus for long.  Patient states when he starts to do an activity, he will stop the activity and start walking outside or smoke a cigarette.  Patient states that he cannot stay still for long in order to participate in the creative outlet.  Patient states that he has been known to procrastinate especially when needing the chores done.  Patient denies forgetfulness and denies being impatient.  A PHQ-9 screen was performed with the patient scoring a 17.  A GAD-7 screen was also performed with the patient scoring a 9.  Patient is alert and oriented x 4, calm, cooperative, and fully engaged in conversation during the encounter.  Patient endorses fine mood.  Patient denies suicidal or homicidal ideations.  He further denies auditory or visual hallucinations and does not appear to be responding to internal/external stimuli.  Patient endorses intermittent sleep and receives on average 4 to 5 hours of sleep each night.  Patient endorses fair appetite and eats on average 2 meals a day.  Patient denies alcohol consumption.  Patient endorses tobacco use and smokes on average 8 to 10 cigarettes/day.  Patient denies illicit drug use.  Visit Diagnosis:    ICD-10-CM   1. Attention and concentration deficit  R41.840 atomoxetine  (STRATTERA) 40 MG capsule    2. Adjustment disorder with mixed anxiety and depressed mood  F43.23 citalopram (CELEXA) 20 MG tablet    3. Generalized anxiety disorder  F41.1 citalopram (CELEXA) 20 MG tablet    4. Intermittent explosive disorder in adult  F63.81       Past Psychiatric History:  Adjustment disorder with mixed anxiety and depressed mood Generalized anxiety disorder Intermittent explosive disorder in adult  Past Medical History:  Past Medical History:  Diagnosis Date   Asthma     Past Surgical History:  Procedure Laterality Date   AMPUTATION Right 12/16/2020   Procedure: PARTIAL RIGHT AMPUTATION BELOW KNEE;  Surgeon: Nicholes Stairs, MD;  Location: [place];  Service: Orthopedics;  Laterality: Right;   AMPUTATION Right 12/22/2020   Procedure: RIGHT BELOW KNEE AMPUTATION;  Surgeon: Newt Minion, MD;  Location: [place];  Service: Orthopedics;  Laterality: Right;   APPLICATION OF WOUND VAC Right 12/22/2020   Procedure: APPLICATION OF WOUND VAC;  Surgeon: Newt Minion, MD;  Location: [place];  Service: Orthopedics;  Laterality: Right;   THORACIC AORTIC ENDOVASCULAR STENT GRAFT N/A 12/16/2020   Procedure: THORACIC AORTIC ENDOVASCULAR STENT GRAFT;  Surgeon: Cherre Robins, MD;  Location: [facility] OR;  Service: Vascular;  Laterality: N/A;    Family Psychiatric History:  Patient denies family history of psychiatric illness   Family History:  Family History  Problem Relation Age of Onset   Congestive Heart Failure Maternal Grandfather    Diabetes Maternal Grandfather    Hypertension Maternal Grandfather    Alcohol abuse Maternal Grandfather    Diabetes Maternal Aunt    Alcohol abuse Maternal Aunt    Hypertension Maternal Grandmother     Social History:  Social History   Socioeconomic History   Marital status: Single    Spouse name: Not on file   Number of children: 0   Years of education: 12   Highest education level: Not on file  Occupational History     Employer: [employer]  Tobacco Use   Smoking status: Every Day    Packs/day: 0.50    Types: Cigarettes, E-cigarettes   Smokeless tobacco: Never  Vaping Use   Vaping Use: Never used  Substance and Sexual Activity   Alcohol use: Not Currently    Alcohol/week: 20.0 standard drinks of alcohol    Types: 20 Shots of liquor per week   Drug use: Yes    Comment: Delta 8 daily   Sexual activity: Not Currently  Other Topics Concern   Not on file  Social History Narrative   ** Merged History Encounter **       ** Merged History Encounter **   Patient lives at home with his mother [name]).    Right handed   Patient is not working right now.   Caffeine- sodas two        Social Determinants of Health   Financial Resource Strain: High Risk (09/20/2021)   Overall Financial Resource Strain (CARDIA)    Difficulty of Paying Living Expenses: Very hard  Food Insecurity: Food Insecurity Present (09/20/2021)   Hunger Vital Sign    Worried About Charity fundraiser  in the Last Year: Sometimes true    Ran Out of Food in the Last Year: Sometimes true  Transportation Needs: Unmet Transportation Needs (09/20/2021)   PRAPARE - Transportation    Lack of Transportation (Medical): No    Lack of Transportation (Non-Medical): Yes  Physical Activity: Inactive (09/20/2021)   Exercise Vital Sign    Days of Exercise per Week: 0 days    Minutes of Exercise per Session: 0 min  Stress: Stress Concern Present (09/20/2021)   [employer] of Occupational Health - Occupational Stress Questionnaire    Feeling of Stress : Very much  Social Connections: Socially Isolated (09/20/2021)   Social Connection and Isolation Panel [NHANES]    Frequency of Communication with Friends and Family: Never    Frequency of Social Gatherings with Friends and Family: Never    Attends Religious Services: Never    Database administrator or Organizations: No    Attends Engineer, structural: Never    Marital Status:  Separated    Allergies: No Known Allergies  Metabolic Disorder Labs: Lab Results  Component Value Date   HGBA1C 4.2 (L) 02/21/2022   MPG 73.84 02/21/2022   No results found for: "PROLACTIN" Lab Results  Component Value Date   CHOL 193 02/21/2022   TRIG 73 02/21/2022   HDL 45 02/21/2022   CHOLHDL 4.3 02/21/2022   VLDL 15 02/21/2022   LDLCALC 133 (H) 02/21/2022   Lab Results  Component Value Date   TSH 0.691 02/21/2022    Therapeutic Level Labs: No results found for: "LITHIUM" No results found for: "VALPROATE" No results found for: "CBMZ"  Current Medications: Current Outpatient Medications  Medication Sig Dispense Refill   atomoxetine (STRATTERA) 40 MG capsule Take 1 capsule (40 mg total) by mouth daily. 30 capsule 1   citalopram (CELEXA) 20 MG tablet Take 1 tablet (20 mg total) by mouth daily. 30 tablet 1   hydrOXYzine (ATARAX) 10 MG tablet Take 1 tablet (10 mg total) by mouth 3 (three) times daily as needed for anxiety. 75 tablet 1   ramelteon (ROZEREM) 8 MG tablet Take 1 tablet (8 mg total) by mouth at bedtime. 30 tablet 2   No current facility-administered medications for this visit.     Musculoskeletal: Strength & Muscle Tone: Unable to assess due to telemedicine visit Gait & Station: Unable to assess due to telemedicine visit Patient leans: Unable to assess due to telemedicine visit  Psychiatric Specialty Exam: Review of Systems  Psychiatric/Behavioral:  Positive for decreased concentration and sleep disturbance. Negative for dysphoric mood, hallucinations, self-injury and suicidal ideas. The patient is nervous/anxious. The patient is not hyperactive.     There were no vitals taken for this visit.There is no height or weight on file to calculate BMI.  General Appearance: Unable to assess due to telemedicine visit  Eye Contact:  Unable to assess due to telemedicine visit  Speech:  Clear and Coherent and Normal Rate  Volume:  Normal  Mood:  Anxious and  Depressed  Affect:  Congruent  Thought Process:  Coherent, Goal Directed, and Descriptions of Associations: Intact  Orientation:  Full (Time, Place, and Person)  Thought Content: WDL   Suicidal Thoughts:  No  Homicidal Thoughts:  No  Memory:  Immediate;   Good Recent;   Good Remote;   Fair  Judgement:  Good  Insight:  Fair  Psychomotor Activity:  Normal  Concentration:  Concentration: Good and Attention Span: Good  Recall:  [employer] of Knowledge:  Good  Language: Good  Akathisia:  No  Handed:  Right  AIMS (if indicated): not done  Assets:  Communication Skills Desire for Improvement Housing  ADL's:  Intact  Cognition: WNL  Sleep:  Fair   Screenings: AUDIT    Advertising copywriter from 09/20/2021 in [facility]  Alcohol Use Disorder Identification Test Final Score (AUDIT) 16      GAD-7    Flowsheet Row Video Visit from 06/15/2022 in [facility] Video Visit from 02/28/2022 in [facility] Video Visit from 01/25/2022 in [facility] Office Visit from 10/12/2021 in [facility] Counselor from 09/20/2021 in [facility]  Total GAD-7 Score 9 9 8 14 18       PHQ2-9    Flowsheet Row Video Visit from 06/15/2022 in [facility] Video Visit from 02/28/2022 in [facility] Video Visit from 01/25/2022 in [facility] Office Visit from 10/12/2021 in [facility] Counselor from 09/20/2021 in [facility]  PHQ-2 Total Score 5 3 5 5 6   PHQ-9 Total Score 17 12 18 17 21       Flowsheet Row Video Visit from 06/15/2022 in [facility] Video Visit from 02/28/2022 in [facility] ED from 02/21/2022 in [facility]   C-SSRS RISK CATEGORY Low Risk Moderate Risk High Risk        Assessment and Plan:   Christian Hartman is a 32 year old, African-American male with a past psychiatric history significant for adjustment disorder with mixed anxiety and depressed mood, generalized anxiety disorder, and intermittent explosive disorder and adult, and attention and concentration deficits who presents to [facility] via virtual telephone visit for follow-up and medication management.  Patient reports that he has been experiencing the need to urinate in the morning upon waking up when using his citalopram but denies any other major issues with the medication.  Patient states that his ramelteon has not been helpful in managing his sleep and his hydroxyzine appears to be ineffective in managing his anxiety.  Patient also notes that he has been having difficulties with attention and concentration.  Patient states that he has been trying to engage in creative outlets in an attempt to manage his mental health but is unable to stay focused long enough to participate in the activity.  Patient has a past history of being managed on Ritalin and Adderall.  Patient was recommended Strattera 40 mg daily for the management of his attention and concentration deficit.  Patient was agreeable to recommendation.  Patient's medications to be e-prescribed to pharmacy of choice.  Collaboration of Care: Collaboration of Care: Medication Management AEB provider managing patient's psychiatric medications, Psychiatrist AEB patient being followed by mental health provider, and Referral or follow-up with counselor/therapist AEB patient being seen by a licensed clinical social worker at this facility  Patient/Guardian was advised Release of Information must be obtained prior to any record release in order to collaborate their care with an outside provider. Patient/Guardian was advised if they have not already done so  to contact the registration department to sign all necessary forms in order for Christian Hartman to release information regarding their care.   Consent: Patient/Guardian gives verbal consent for treatment and assignment of benefits for services provided during this visit. Patient/Guardian expressed understanding and agreed  to proceed.   1. Adjustment disorder with mixed anxiety and depressed mood  - citalopram (CELEXA) 20 MG tablet; Take 1 tablet (20 mg total) by mouth daily.  Dispense: 30 tablet; Refill: 1  2. Attention and concentration deficit  - atomoxetine (STRATTERA) 40 MG capsule; Take 1 capsule (40 mg total) by mouth daily.  Dispense: 30 capsule; Refill: 1  3. Generalized anxiety disorder  - citalopram (CELEXA) 20 MG tablet; Take 1 tablet (20 mg total) by mouth daily.  Dispense: 30 tablet; Refill: 1  4. Intermittent explosive disorder in adult  Patient to follow-up in 6 weeks Provider spent a total of 25 minutes with the patient/reviewing patient's chart  Meta Hatchet, PA 06/15/2022, 7:27 PM

## 2022-07-05 ENCOUNTER — Ambulatory Visit (HOSPITAL_COMMUNITY): Payer: Medicaid Other | Admitting: Mental Health

## 2022-07-10 ENCOUNTER — Ambulatory Visit (HOSPITAL_COMMUNITY): Payer: Medicaid Other | Admitting: Licensed Clinical Social Worker

## 2022-07-26 ENCOUNTER — Encounter (HOSPITAL_COMMUNITY): Payer: Self-pay | Admitting: Physician Assistant

## 2022-07-26 ENCOUNTER — Telehealth (INDEPENDENT_AMBULATORY_CARE_PROVIDER_SITE_OTHER): Payer: Medicaid Other | Admitting: Physician Assistant

## 2022-07-26 DIAGNOSIS — R41840 Attention and concentration deficit: Secondary | ICD-10-CM

## 2022-07-26 DIAGNOSIS — F4323 Adjustment disorder with mixed anxiety and depressed mood: Secondary | ICD-10-CM | POA: Diagnosis not present

## 2022-07-26 DIAGNOSIS — F411 Generalized anxiety disorder: Secondary | ICD-10-CM

## 2022-07-26 NOTE — Progress Notes (Signed)
[place] MD/PA/NP OP Progress Note  07/26/2022 7:08 PM Christian Hartman  MRN:  [id number]  Chief Complaint:  Chief Complaint  Patient presents with   Follow-up   Medication Management   HPI: ***  Christian Hartman  Visit Diagnosis:    ICD-10-CM   1. Generalized anxiety disorder  F41.1     2. Adjustment disorder with mixed anxiety and depressed mood  F43.23     3. Attention and concentration deficit  R41.840       Past Psychiatric History:  Adjustment disorder with mixed anxiety and depressed mood Generalized anxiety disorder Intermittent explosive disorder in adult  Past Medical History:  Past Medical History:  Diagnosis Date   Asthma     Past Surgical History:  Procedure Laterality Date   AMPUTATION Right 12/16/2020   Procedure: PARTIAL RIGHT AMPUTATION BELOW KNEE;  Surgeon: Nicholes Stairs, MD;  Location: [place];  Service: Orthopedics;  Laterality: Right;   AMPUTATION Right 12/22/2020   Procedure: RIGHT BELOW KNEE AMPUTATION;  Surgeon: Newt Minion, MD;  Location: [place];  Service: Orthopedics;  Laterality: Right;   APPLICATION OF WOUND VAC Right 12/22/2020   Procedure: APPLICATION OF WOUND VAC;  Surgeon: Newt Minion, MD;  Location: [place];  Service: Orthopedics;  Laterality: Right;   THORACIC AORTIC ENDOVASCULAR STENT GRAFT N/A 12/16/2020   Procedure: THORACIC AORTIC ENDOVASCULAR STENT GRAFT;  Surgeon: Cherre Robins, MD;  Location: [facility] OR;  Service: Vascular;  Laterality: N/A;    Family Psychiatric History:  Patient denies family history of psychiatric illness    Family History:  Family History  Problem Relation Age of Onset   Congestive Heart Failure Maternal Grandfather    Diabetes Maternal Grandfather    Hypertension Maternal Grandfather    Alcohol abuse Maternal Grandfather    Diabetes Maternal Aunt    Alcohol abuse Maternal Aunt    Hypertension Maternal Grandmother     Social History:  Social History   Socioeconomic History   Marital status:  Single    Spouse name: Not on file   Number of children: 0   Years of education: 12   Highest education level: Not on file  Occupational History    Employer: [employer]  Tobacco Use   Smoking status: Every Day    Packs/day: 0.50    Types: Cigarettes, E-cigarettes   Smokeless tobacco: Never  Vaping Use   Vaping Use: Never used  Substance and Sexual Activity   Alcohol use: Not Currently    Alcohol/week: 20.0 standard drinks of alcohol    Types: 20 Shots of liquor per week   Drug use: Yes    Comment: Delta 8 daily   Sexual activity: Not Currently  Other Topics Concern   Not on file  Social History Narrative   ** Merged History Encounter **       ** Merged History Encounter **   Patient lives at home with his mother [name]).    Right handed   Patient is not working right now.   Caffeine- sodas two        Social Determinants of Health   Financial Resource Strain: High Risk (09/20/2021)   Overall Financial Resource Strain (CARDIA)    Difficulty of Paying Living Expenses: Very hard  Food Insecurity: Food Insecurity Present (09/20/2021)   Hunger Vital Sign    Worried About Running Out of Food in the Last Year: Sometimes true    Ran Out of Food in the Last Year: Sometimes true  Transportation Needs: Unmet Transportation Needs (09/20/2021)   PRAPARE - Transportation    Lack of Transportation (Medical): No    Lack of Transportation (Non-Medical): Yes  Physical Activity: Inactive (09/20/2021)   Exercise Vital Sign    Days of Exercise per Week: 0 days    Minutes of Exercise per Session: 0 min  Stress: Stress Concern Present (09/20/2021)   [place]    Feeling of Stress : Very much  Social Connections: Socially Isolated (09/20/2021)   Social Connection and Isolation Panel [NHANES]    Frequency of Communication with Friends and Family: Never    Frequency of Social Gatherings with Friends and Family: Never     Attends Religious Services: Never    Marine scientist or Organizations: No    Attends Music therapist: Never    Marital Status: Separated    Allergies: No Known Allergies  Metabolic Disorder Labs: Lab Results  Component Value Date   HGBA1C 4.2 (L) 02/21/2022   MPG 73.84 02/21/2022   No results found for: "PROLACTIN" Lab Results  Component Value Date   CHOL 193 02/21/2022   TRIG 73 02/21/2022   HDL 45 02/21/2022   CHOLHDL 4.3 02/21/2022   VLDL 15 02/21/2022   LDLCALC 133 (H) 02/21/2022   Lab Results  Component Value Date   TSH 0.691 02/21/2022    Therapeutic Level Labs: No results found for: "LITHIUM" No results found for: "VALPROATE" No results found for: "CBMZ"  Current Medications: Current Outpatient Medications  Medication Sig Dispense Refill   atomoxetine (STRATTERA) 40 MG capsule Take 1 capsule (40 mg total) by mouth daily. 30 capsule 1   citalopram (CELEXA) 20 MG tablet Take 1 tablet (20 mg total) by mouth daily. 30 tablet 1   hydrOXYzine (ATARAX) 10 MG tablet Take 1 tablet (10 mg total) by mouth 3 (three) times daily as needed for anxiety. 75 tablet 1   ramelteon (ROZEREM) 8 MG tablet Take 1 tablet (8 mg total) by mouth at bedtime. 30 tablet 2   No current facility-administered medications for this visit.     Musculoskeletal: Strength & Muscle Tone: within normal limits Gait & Station: normal Patient leans: N/A  Psychiatric Specialty Exam: Review of Systems  Psychiatric/Behavioral:  Positive for decreased concentration and sleep disturbance. Negative for dysphoric mood, hallucinations, self-injury and suicidal ideas. The patient is nervous/anxious. The patient is not hyperactive.     Blood pressure 116/87, pulse 82, temperature 98.6 F (37 C), temperature source Oral, height '6\' 1"'$  (1.854 m), weight 168 lb (76.2 kg).Body mass index is 22.16 kg/m.  General Appearance: Casual  Eye Contact:  Good  Speech:  Clear and Coherent and  Normal Rate  Volume:  Normal  Mood:  Anxious and Depressed  Affect:  Appropriate  Thought Process:  Coherent, Goal Directed, and Descriptions of Associations: Intact  Orientation:  Full (Time, Place, and Person)  Thought Content: WDL   Suicidal Thoughts:  No  Homicidal Thoughts:  No  Memory:  Immediate;   Good Recent;   Good Remote;   Fair  Judgement:  Good  Insight:  Fair  Psychomotor Activity:  Normal  Concentration:  Concentration: Good and Attention Span: Good  Recall:  [place] of Knowledge: Good  Language: Good  Akathisia:  No  Handed:  Right  AIMS (if indicated): not done  Assets:  Communication Skills Desire for Improvement Housing  ADL's:  Intact  Cognition: WNL  Sleep:  Fair  Screenings: AUDIT    Health and safety inspector from 09/20/2021 in [facility]  Alcohol Use Disorder Identification Test Final Score (AUDIT) 16      GAD-7    Flowsheet Row Video Visit from 07/26/2022 in [facility] Video Visit from 06/15/2022 in [facility] Video Visit from 02/28/2022 in [facility] Video Visit from 01/25/2022 in [facility] Office Visit from 10/12/2021 in [facility]  Total GAD-7 Score '8 9 9 8 14      '$ PHQ2-9    Flowsheet Row Video Visit from 07/26/2022 in [facility] Video Visit from 06/15/2022 in [facility] Video Visit from 02/28/2022 in [facility] Video Visit from 01/25/2022 in [facility] Office Visit from 10/12/2021 in [place]  PHQ-2 Total Score '3 5 3 5 5  '$ PHQ-9 Total Score '11 17 12 18 17      '$ Flowsheet Row Video Visit from 07/26/2022 in [facility] Video Visit from 06/15/2022 in [facility] Video  Visit from 02/28/2022 in [place] Risk Moderate Risk        Assessment and Plan: ***    Collaboration of Care: Collaboration of Care: Medication Management AEB provider managing patient's psychiatric medications, Psychiatrist AEB patient being seen by a mental health provider at this facility, and Referral or follow-up with counselor/therapist AEB patient being seen by a therapist outside of this facility [facility]).  Patient/Guardian was advised Release of Information must be obtained prior to any record release in order to collaborate their care with an outside provider. Patient/Guardian was advised if they have not already done so to contact the registration department to sign all necessary forms in order for [place] to release information regarding their care.   Consent: Patient/Guardian gives verbal consent for treatment and assignment of benefits for services provided during this visit. Patient/Guardian expressed understanding and agreed to proceed.   1. Generalized anxiety disorder Patient to continue taking citalopram 20 mg daily for the management of his generalized anxiety disorder  2. Adjustment disorder with mixed anxiety and depressed mood Patient to continue taking citalopram 20 mg daily for the management of his adjustment disorder with mixed anxiety and depressed mood  3. Attention and concentration deficit Patient was given samples for Qelbree 200 mg daily for the management of his attention and concentration deficits  Patient to follow up in 6 weeks Provider spent a total of 25 minutes with the patient/reviewing the patient's chart  Malachy Mood, PA 07/26/2022, 7:08 PM

## 2022-08-02 ENCOUNTER — Encounter (HOSPITAL_COMMUNITY): Payer: Self-pay

## 2022-08-02 ENCOUNTER — Ambulatory Visit (INDEPENDENT_AMBULATORY_CARE_PROVIDER_SITE_OTHER): Payer: Medicaid Other | Admitting: Mental Health

## 2022-08-02 DIAGNOSIS — F331 Major depressive disorder, recurrent, moderate: Secondary | ICD-10-CM

## 2022-08-02 DIAGNOSIS — F411 Generalized anxiety disorder: Secondary | ICD-10-CM

## 2022-08-02 DIAGNOSIS — F6381 Intermittent explosive disorder: Secondary | ICD-10-CM
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
## 2022-09-06 ENCOUNTER — Telehealth (HOSPITAL_COMMUNITY): Payer: Medicaid Other | Admitting: Physician Assistant

## 2022-09-06 ENCOUNTER — Ambulatory Visit (HOSPITAL_COMMUNITY): Payer: Medicaid Other | Admitting: Mental Health

## 2022-10-11 IMAGING — CT CT CTA ABD/PEL W/CM AND/OR W/O CM
3 of 11 series · 10 of 46 positions shown, 16 images · IV contrast (APPLIED)
Comparison: CT chest, abdomen and pelvis 12/19/2020

CLINICAL DATA: Evaluate for hepatic pseudoaneurysm

EXAM:
CTA ABDOMEN AND PELVIS WITHOUT AND WITH CONTRAST
TECHNIQUE: Multidetector CT imaging of the abdomen and pelvis was performed
using the standard protocol during bolus administration of
intravenous contrast. Multiplanar reconstructed images and MIPs were
obtained and reviewed to evaluate the vascular anatomy.
CONTRAST:  100mL OMNIPAQUE IOHEXOL 350 MG/ML SOLN

[Series 7: liver arterial · axial · arterial · 0.84mm/px · 1 of 189 slices shown]
[im 27/189  soft-tissue]
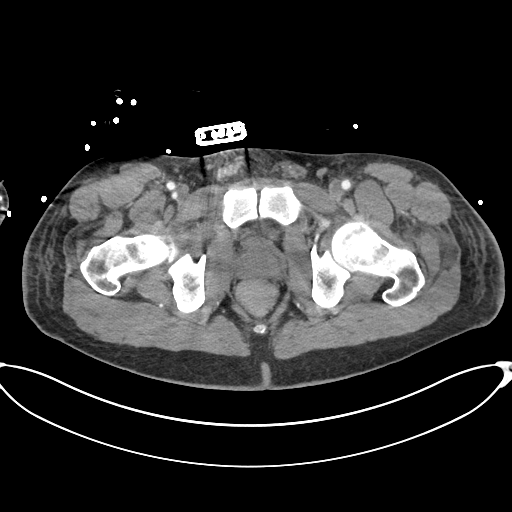

[Series 8: liver portal venous · axial · portal-venous · 0.84mm/px · z∈[-620,-215]mm · 6 of 189 slices shown, 11 images]
[im 27/189  soft-tissue]
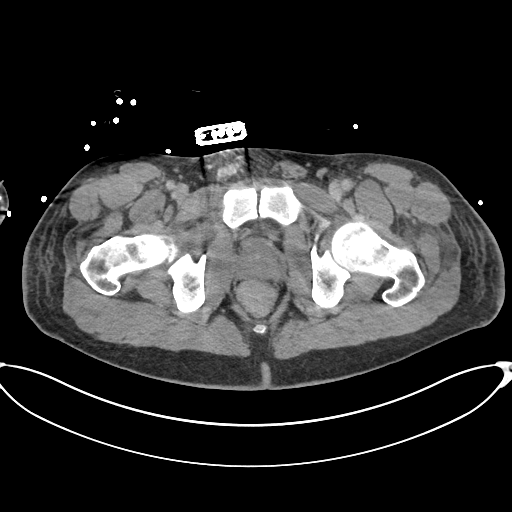
[im 27/189  bone]
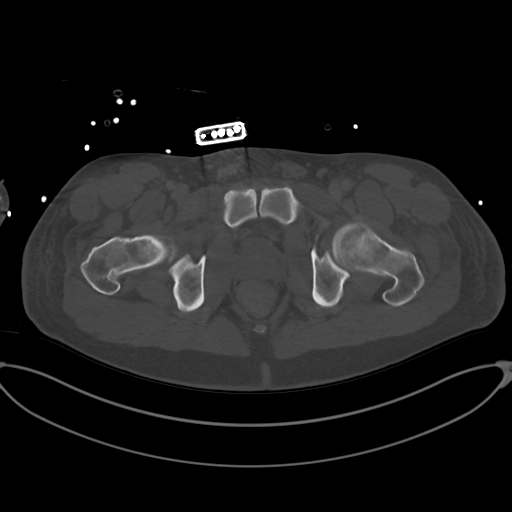
[im 54/189  soft-tissue]
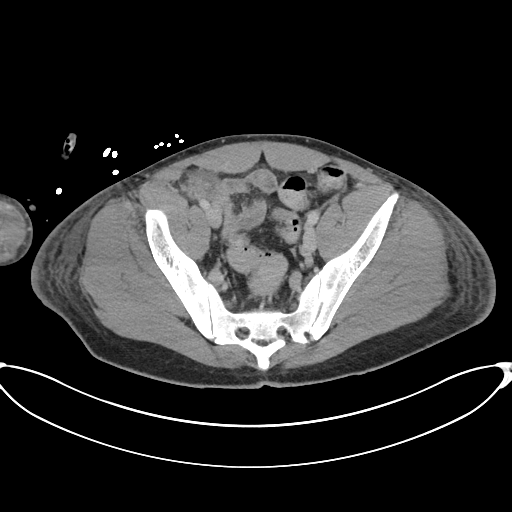
[im 81/189  soft-tissue]
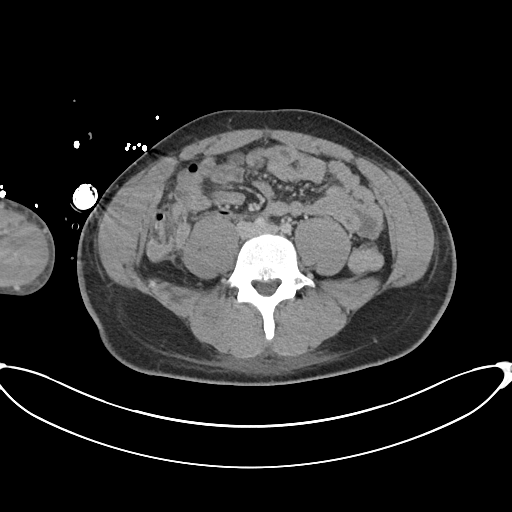
[im 81/189  lung]
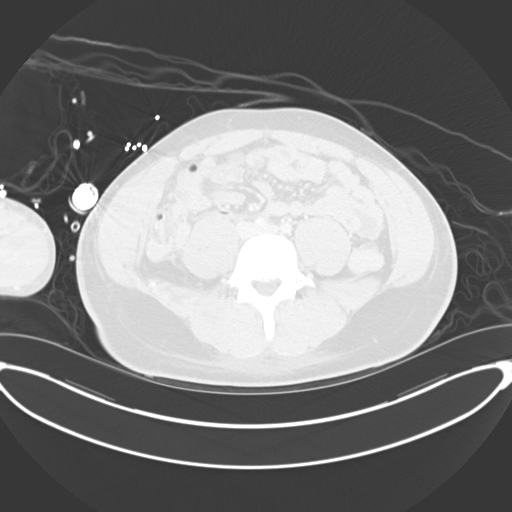
[im 108/189  soft-tissue]
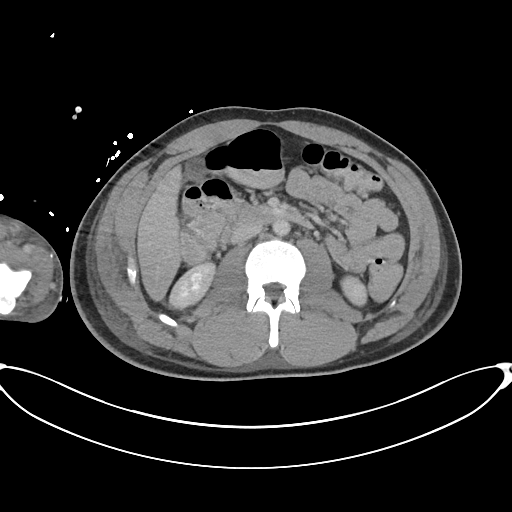
[im 108/189  lung]
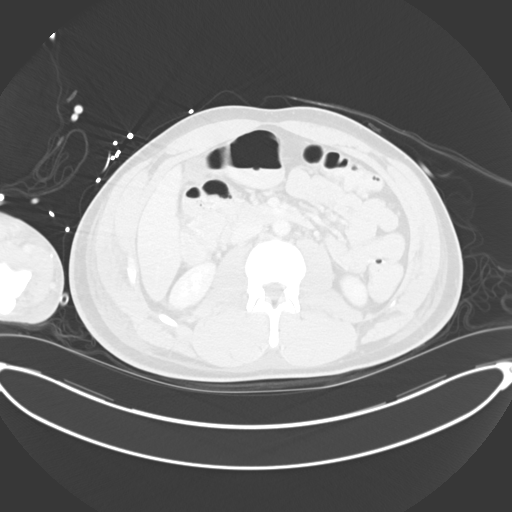
[im 135/189  soft-tissue]
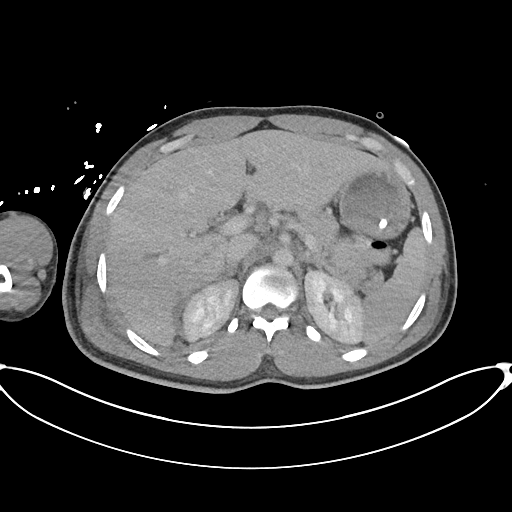
[im 135/189  lung]
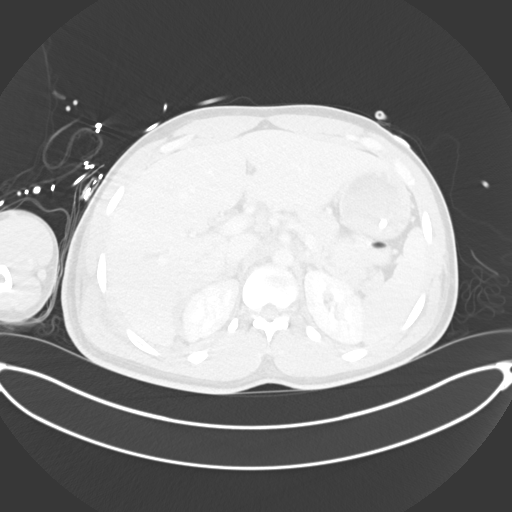
[im 162/189  soft-tissue]
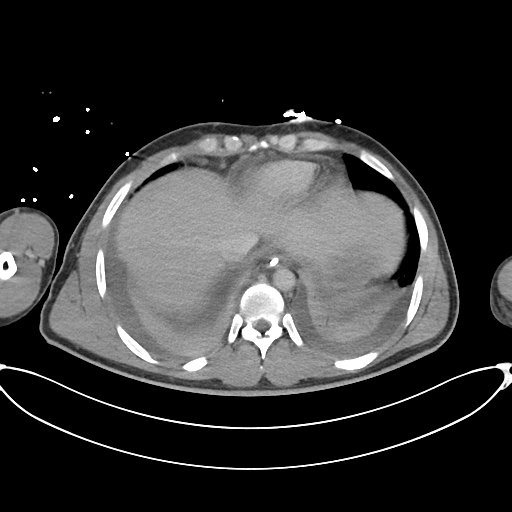
[im 162/189  lung]
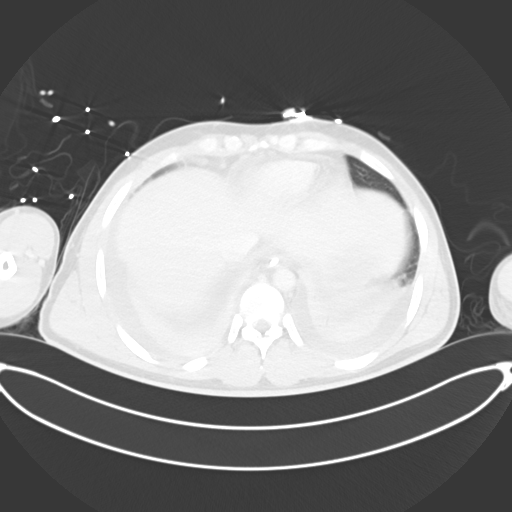

[Series 13: cor post · coronal · 0.98mm/px · 3 of 86 slices shown, 4 images]
[im 22/86  soft-tissue]
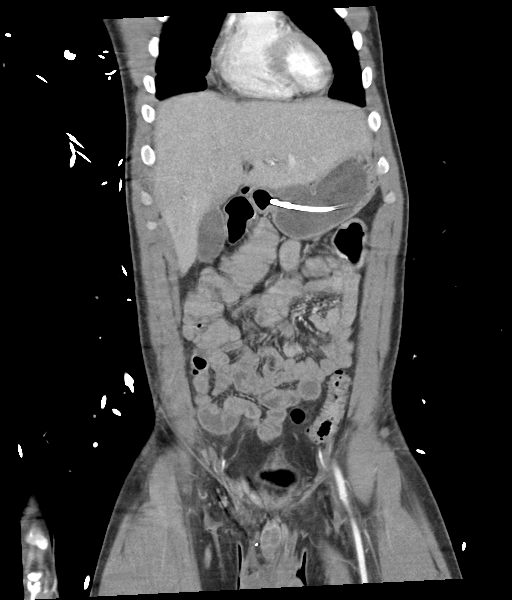
[im 43/86  soft-tissue]
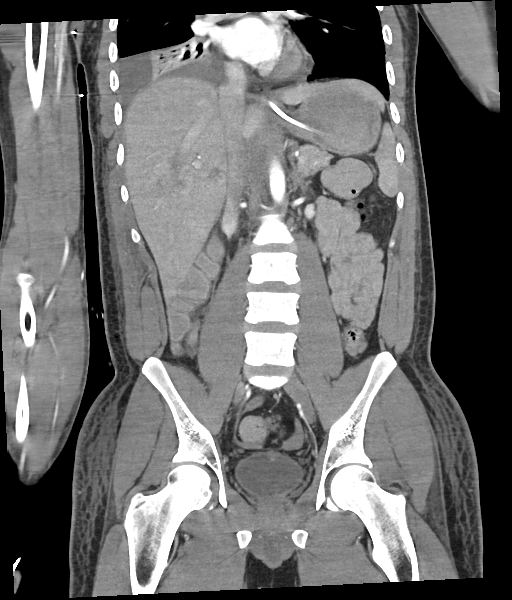
[im 43/86  bone]
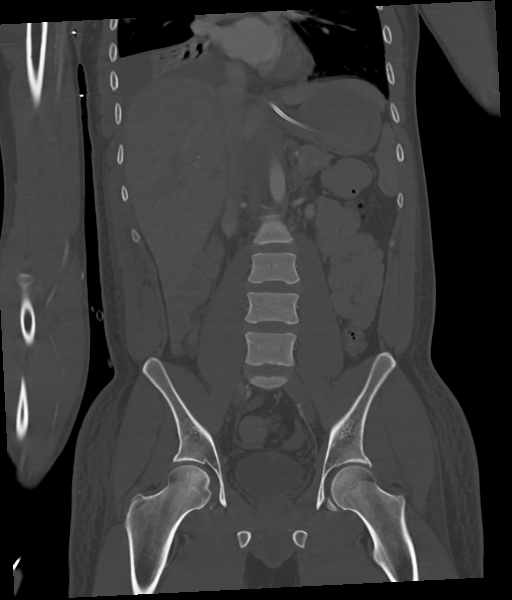
[im 64/86  soft-tissue]
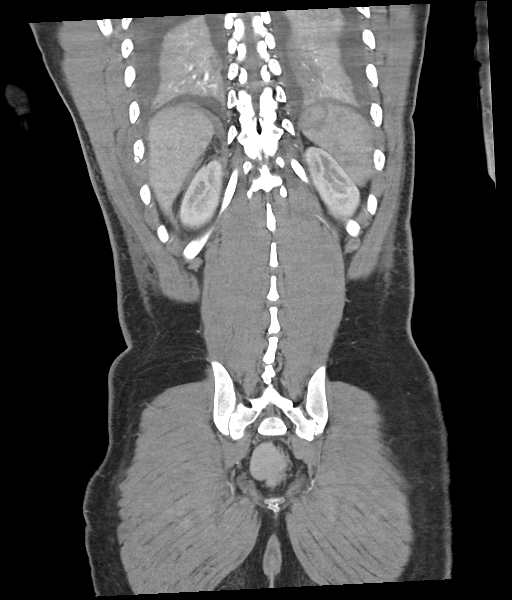

[10 of 46 positions shown; findings below may reference images not displayed]

FINDINGS: VASCULAR

Aorta: Partially imaged endograft in the descending thoracic aorta
without evidence of complication. The remaining aorta is intact and
unremarkable. No aneurysm or dissection.

Celiac: Widely patent. The lateral segmental branch of the left
hepatic artery is replaced to the left gastric artery. No evidence
of hepatic artery pseudoaneurysm. Similarly, the splenic artery is
intact.

SMA: Patent without evidence of aneurysm, dissection, vasculitis or
significant stenosis.

Renals: Both renal arteries are patent without evidence of aneurysm,
dissection, vasculitis, fibromuscular dysplasia or significant
stenosis.

IMA: Patent without evidence of aneurysm, dissection, vasculitis or
significant stenosis.

Inflow: Patent without evidence of aneurysm, dissection, vasculitis
or significant stenosis.

Proximal Outflow: Bilateral common femoral and visualized portions
of the superficial and profunda femoral arteries are patent without
evidence of aneurysm, dissection, vasculitis or significant
stenosis.

Veins: No focal venous abnormality or evidence of active venous
bleeding.

Review of the MIP images confirms the above findings.

NON-VASCULAR

Lower chest: Moderate right and small left layering pleural
effusions with associated bibasilar atelectasis. Similar appearance
of a bleb in the lingula. Improving multifocal patchy airspace
opacities compared to prior.

Hepatobiliary: Healing multifocal right-sided liver lacerations
which appear less conspicuous compared to recent prior imaging from
[DATE]. Interval resolution of periportal edema. Gallbladder is
unremarkable. No intra or extrahepatic biliary ductal dilatation.

Pancreas: Unremarkable. No pancreatic ductal dilatation or
surrounding inflammatory changes.

Spleen: Normal in size without focal abnormality.

Adrenals/Urinary Tract: Adrenal glands are unremarkable. Kidneys are
normal, without renal calculi, focal lesion, or hydronephrosis.
Bladder is unremarkable.

Stomach/Bowel: Stomach is within normal limits. Appendix appears
normal. No evidence of bowel wall thickening, distention, or
inflammatory changes. Feeding tube present with the tip in the
gastric antrum.

Lymphatic: No suspicious lymphadenopathy.

Reproductive: Prostate is unremarkable.

Other: Decreasing hemoperitoneum in the right pericolic gutter and
anatomic pelvis compared to 12/19/2020. No new ascites,
hemoperitoneum or evidence of hernia.

Musculoskeletal: Fracture of the posterolateral aspect of the right
ninth rib.
IMPRESSION: VASCULAR

1. No evidence of hepatic artery pseudoaneurysm or other acute
vascular abnormality.
2. No evidence of arterial or venous bleeding.
3. Partially imaged thoracic aortic endograft without evidence of
complication.

NON-VASCULAR

1. Nondisplaced fracture the posterolateral aspect of the right
ninth rib.
2. Healing liver lacerations with visible improvement compared to
12/19/2020.
3. Resolving hemoperitoneum in the anatomic pelvis and right
pericolic gutter compared to 12/19/2020.
4. Moderate right and small left layering pleural effusions with
associated atelectasis.
5. Feeding tube present with the tip in the gastric antrum.

## 2022-11-08 ENCOUNTER — Ambulatory Visit (INDEPENDENT_AMBULATORY_CARE_PROVIDER_SITE_OTHER): Payer: Medicaid Other | Admitting: Physician Assistant

## 2022-11-08 VITALS — BP 137/72 | HR 78 | Ht 74.0 in

## 2022-11-08 DIAGNOSIS — F4323 Adjustment disorder with mixed anxiety and depressed mood: Secondary | ICD-10-CM

## 2022-11-08 DIAGNOSIS — R41840 Attention and concentration deficit: Secondary | ICD-10-CM | POA: Diagnosis not present

## 2022-11-08 DIAGNOSIS — F411 Generalized anxiety disorder: Secondary | ICD-10-CM | POA: Diagnosis not present

## 2022-11-08 MED ORDER — CITALOPRAM HYDROBROMIDE 20 MG PO TABS: 20.0000 mg | ORAL_TABLET | Freq: Every day | ORAL | 2 refills | Status: AC

## 2022-11-08 NOTE — Progress Notes (Signed)
BH MD/PA/NP OP Progress Note  11/08/2022 4:32 PM Christian Hartman  MRN:  [id number]  Chief Complaint:  Chief Complaint  Patient presents with   Follow-up   Medication Refill   HPI:   Christian Hartman is a 32 year old, African-American male with a past psychiatric history significant for adjustment disorder with mixed anxiety and depressed mood, generalized anxiety disorder, and intermittent explosive disorder and adult, and attention and concentration deficits who presents to [facility] for follow-up and medication management.  Patient is currently being managed on the following psychiatric medications:  Citalopram 20 mg daily Qelbree 200 mg daily  Patient reports that he did not notice much of a difference in his concentration through the use of Qelbree.  He states that he took it consistently in the beginning but slowly stopped taking it once he did not see much benefit in the medication.  Patient reports that he still continues to take his citalopram and denies experiencing anxiety attacks.  He reports that things have been much better since writing spoken word as a hobby in addition to taking his medication.  On another note, patient reports that he was recently denied disability and is waiting for an in person court hearing to appeal which his lawyer is setting up.  Patient endorses minimal depression.  He continues to state that his sleep is still all over the place.  In regards to stressors in his life, patient states that his therapy sessions with [facility] have occasionally been canceled to make room for other clients.  Patient states that he also got a form in the mail stating that his EBT was being taken away.  Patient states that this issue has happened in the past and states that the predicament was easily reversed. A PHQ-9 screen was performed with the patient scoring of 13.  A GAD-7 screen was also performed with the patient scoring  an 8.  Patient is alert and oriented x 4, calm, cooperative, and fully engaged in conversation during the encounter.  Patient endorses good mood.  Patient denies suicidal or homicidal ideations.  He further denies auditory or visual hallucinations and does not appear to be responding to internal/external stimuli.  Patient endorses good fair sleep and receives on average 4 to 5 hours of sleep per night.  Patient states that he will occasionally take a midday nap that last 3 to 4 hours at a time.  Patient endorses fair appetite and eats on average 1-2 meals per day.  Patient denies alcohol consumption and illicit drug use.  Patient endorses tobacco use and smokes on average 5 to 6 cigarettes/day.  Visit Diagnosis:    ICD-10-CM   1. Generalized anxiety disorder  F41.1 citalopram (CELEXA) 20 MG tablet    2. Adjustment disorder with mixed anxiety and depressed mood  F43.23 citalopram (CELEXA) 20 MG tablet      Past Psychiatric History:  Adjustment disorder with mixed anxiety and depressed mood Generalized anxiety disorder Intermittent explosive disorder in adult  Past Medical History:  Past Medical History:  Diagnosis Date   Asthma     Past Surgical History:  Procedure Laterality Date   AMPUTATION Right 12/16/2020   Procedure: PARTIAL RIGHT AMPUTATION BELOW KNEE;  Surgeon: Yolonda Kida, MD;  Location: [facility] OR;  Service: Orthopedics;  Laterality: Right;   AMPUTATION Right 12/22/2020   Procedure: RIGHT BELOW KNEE AMPUTATION;  Surgeon: Nadara Mustard, MD;  Location: [facility] OR;  Service: Orthopedics;  Laterality: Right;  APPLICATION OF WOUND VAC Right 12/22/2020   Procedure: APPLICATION OF WOUND VAC;  Surgeon: Nadara Mustard, MD;  Location: [facility] OR;  Service: Orthopedics;  Laterality: Right;   THORACIC AORTIC ENDOVASCULAR STENT GRAFT N/A 12/16/2020   Procedure: THORACIC AORTIC ENDOVASCULAR STENT GRAFT;  Surgeon: Leonie Douglas, MD;  Location[facility] OR;  Service: Vascular;  Laterality: N/A;     Family Psychiatric History:  Patient denies family history of psychiatric illness    Family History:  Family History  Problem Relation Age of Onset   Congestive Heart Failure Maternal Grandfather    Diabetes Maternal Grandfather    Hypertension Maternal Grandfather    Alcohol abuse Maternal Grandfather    Diabetes Maternal Aunt    Alcohol abuse Maternal Aunt    Hypertension Maternal Grandmother     Social History:  Social History   Socioeconomic History   Marital status: Single    Spouse name: Not on file   Number of children: 0   Years of education: 12   Highest education level: Not on file  Occupational History    Employer: [employer]  Tobacco Use   Smoking status: Every Day    Packs/day: .5    Types: Cigarettes, E-cigarettes   Smokeless tobacco: Never  Vaping Use   Vaping Use: Never used  Substance and Sexual Activity   Alcohol use: Not Currently    Alcohol/week: 20.0 standard drinks of alcohol    Types: 20 Shots of liquor per week   Drug use: Yes    Comment: Delta 8 daily   Sexual activity: Not Currently  Other Topics Concern   Not on file  Social History Narrative   ** Merged History Encounter **       ** Merged History Encounter **   Patient lives at home with his mother Christian Hartman).    Right handed   Patient is not working right now.   Caffeine- sodas two        Social Determinants of Health   Financial Resource Strain: High Risk (09/20/2021)   Overall Financial Resource Strain (CARDIA)    Difficulty of Paying Living Expenses: Very hard  Food Insecurity: Food Insecurity Present (09/20/2021)   Hunger Vital Sign    Worried About Running Out of Food in the Last Year: Sometimes true    Ran Out of Food in the Last Year: Sometimes true  Transportation Needs: Unmet Transportation Needs (09/20/2021)   PRAPARE - Transportation    Lack of Transportation (Medical): No    Lack of Transportation (Non-Medical): Yes  Physical Activity: Inactive  (09/20/2021)   Exercise Vital Sign    Days of Exercise per Week: 0 days    Minutes of Exercise per Session: 0 min  Stress: Stress Concern Present (09/20/2021)   [employer] of Occupational Health - Occupational Stress Questionnaire    Feeling of Stress : Very much  Social Connections: Socially Isolated (09/20/2021)   Social Connection and Isolation Panel [NHANES]    Frequency of Communication with Friends and Family: Never    Frequency of Social Gatherings with Friends and Family: Never    Attends Religious Services: Never    Database administrator or Organizations: No    Attends Banker Meetings: Never    Marital Status: Separated    Allergies: No Known Allergies  Metabolic Disorder Labs: Lab Results  Component Value Date   HGBA1C 4.2 (L) 02/21/2022   MPG 73.84 02/21/2022   No results found for: "PROLACTIN" Lab Results  Component Value Date   CHOL 193 02/21/2022   TRIG 73 02/21/2022   HDL 45 02/21/2022   CHOLHDL 4.3 02/21/2022   VLDL 15 02/21/2022   LDLCALC 133 (H) 02/21/2022   Lab Results  Component Value Date   TSH 0.691 02/21/2022    Therapeutic Level Labs: No results found for: "LITHIUM" No results found for: "VALPROATE" No results found for: "CBMZ"  Current Medications: Current Outpatient Medications  Medication Sig Dispense Refill   atomoxetine (STRATTERA) 40 MG capsule Take 1 capsule (40 mg total) by mouth daily. 30 capsule 1   citalopram (CELEXA) 20 MG tablet Take 1 tablet (20 mg total) by mouth daily. 30 tablet 2   hydrOXYzine (ATARAX) 10 MG tablet Take 1 tablet (10 mg total) by mouth 3 (three) times daily as needed for anxiety. 75 tablet 1   ramelteon (ROZEREM) 8 MG tablet Take 1 tablet (8 mg total) by mouth at bedtime. 30 tablet 2   No current facility-administered medications for this visit.     Musculoskeletal: Strength & Muscle Tone: within normal limits Gait & Station: normal Patient leans: N/A  Psychiatric Specialty  Exam: Review of Systems  Psychiatric/Behavioral:  Positive for decreased concentration and sleep disturbance. Negative for dysphoric mood, hallucinations, self-injury and suicidal ideas. The patient is nervous/anxious. The patient is not hyperactive.     There were no vitals taken for this visit.There is no height or weight on file to calculate BMI.  General Appearance: Casual  Eye Contact:  Good  Speech:  Clear and Coherent and Normal Rate  Volume:  Normal  Mood:  Euthymic  Affect:  Appropriate  Thought Process:  Coherent, Goal Directed, and Descriptions of Associations: Intact  Orientation:  Full (Time, Place, and Person)  Thought Content: WDL   Suicidal Thoughts:  No  Homicidal Thoughts:  No  Memory:  Immediate;   Good Recent;   Good Remote;   Fair  Judgement:  Good  Insight:  Fair  Psychomotor Activity:  Normal  Concentration:  Concentration: Good and Attention Span: Good  Recall:  Fair  Fund of Knowledge: Good  Language: Good  Akathisia:  No  Handed:  Right  AIMS (if indicated): not done  Assets:  Communication Skills Desire for Improvement Housing  ADL's:  Intact  Cognition: WNL  Sleep:  Fair   Screenings: AUDIT    Advertising copywriter from 09/20/2021 in [facility]  Alcohol Use Disorder Identification Test Final Score (AUDIT) 16      GAD-7    Flowsheet Row Clinical Support from 11/08/2022 in [facility] Video Visit from 07/26/2022 in [facility] Video Visit from 06/15/2022 in [facility] Video Visit from 02/28/2022 in [facility] Video Visit from 01/25/2022 in [facility]  Total GAD-7 Score 8 8 9 9 8       PHQ2-9    Flowsheet Row Clinical Support from 11/08/2022 in [facility] Video Visit from 07/26/2022 in [facility] Video Visit from  06/15/2022 in [facility] Video Visit from 02/28/2022 in [facility] Video Visit from 01/25/2022 in [facility]  PHQ-2 Total Score 2 3 5 3 5   PHQ-9 Total Score 13 11 17 12 18       Flowsheet Row Clinical Support from 11/08/2022 in [facility] Video Visit from 07/26/2022 in [facility] Video Visit from 06/15/2022 in [facility]  C-SSRS RISK CATEGORY Low Risk Low Risk Low Risk        Assessment and Plan:   Christian Hartman is a 32 year old, African-American male with a past psychiatric history significant for adjustment disorder with mixed anxiety and depressed mood, generalized anxiety disorder, and intermittent explosive disorder and adult, and attention and concentration deficits who presents to [facility] for follow-up and medication management.  Patient reports that the [facility] that he was using was not helpful in managing his attention and concentration.  He does report that his citalopram continues to be helpful in managing his depressive symptoms and denies experiencing anxiety attacks.  Patient is open to continuing to take citalopram regularly.  Patient's medication to be e-prescribed to pharmacy of choice.  Patient inquired about resources for primary care services.  Provider provided a resource for patient to reach out to 2 set up an appointment with her primary care provider [facility]).  Provider also gave patient a resource for ADHD assessment ([facility]). Patient was appreciative of the resources provided to him.  Collaboration of Care: Collaboration of Care: Medication Management AEB provider managing patient's psychiatric medications, Psychiatrist AEB patient being seen by a mental health provider at this facility, and  Referral or follow-up with counselor/therapist AEB patient being seen by a therapist outside of this facility [facility]).  Patient/Guardian was advised Release of Information must be obtained prior to any record release in order to collaborate their care with an outside provider. Patient/Guardian was advised if they have not already done so to contact the registration department to sign all necessary forms in order for [place] to release information regarding their care.   Consent: Patient/Guardian gives verbal consent for treatment and assignment of benefits for services provided during this visit. Patient/Guardian expressed understanding and agreed to proceed.   1. Generalized anxiety disorder  - citalopram (CELEXA) 20 MG tablet; Take 1 tablet (20 mg total) by mouth daily.  Dispense: 30 tablet; Refill: 2  2. Adjustment disorder with mixed anxiety and depressed mood  - citalopram (CELEXA) 20 MG tablet; Take 1 tablet (20 mg total) by mouth daily.  Dispense: 30 tablet; Refill: 2  3. Attention and concentration deficit  Patient to follow up in 6 weeks Provider spent a total of 25 minutes with the patient/reviewing the patient's chart  Meta Hatchet, PA 11/08/2022, 4:32 PM

## 2023-01-03 ENCOUNTER — Ambulatory Visit (HOSPITAL_COMMUNITY): Payer: MEDICAID | Admitting: Physician Assistant

## 2023-01-03 ENCOUNTER — Encounter (HOSPITAL_COMMUNITY): Payer: Self-pay | Admitting: Physician Assistant

## 2023-01-03 VITALS — BP 131/78 | HR 95 | Ht 74.0 in | Wt 159.6 lb

## 2023-01-03 DIAGNOSIS — F411 Generalized anxiety disorder: Secondary | ICD-10-CM

## 2023-01-03 DIAGNOSIS — F4323 Adjustment disorder with mixed anxiety and depressed mood: Secondary | ICD-10-CM

## 2023-01-03 NOTE — Progress Notes (Signed)
BH MD/PA/NP OP Progress Note  01/03/2023 9:38 PM Christian Hartman  MRN:  [id number]  Chief Complaint:  Chief Complaint  Patient presents with   Follow-up   HPI:   Christian Hartman is a 32 year old, African-American male with a past psychiatric history significant for adjustment disorder with mixed anxiety and depressed mood, generalized anxiety disorder, and intermittent explosive disorder and adult, and attention and concentration deficits who presents to [facility] for follow-up and medication management.  Patient is currently being managed on the following psychiatric medications:  Citalopram 20 mg daily Qelbree 200 mg daily  Patient presents to the encounter stating that he would like to hold off on taking medications.  He reports that he occasionally has some rough days but reports that he is able to weather through them.  Patient endorses depression and rates his depression as 6 out of 10 with 10 being most severe.  Patient endorses depressive episodes 3 days out of the week.  Patient endorses the following depressive symptoms: sadness and lack of motivation.  Patient states that on occasion, he will end up spiraling on his bad days due to thinking of things that might happen in the future or when examining his life to the coolest detail.  Patient denies anxiety today and states that he experiences anxiety mostly when he is depressed.  Patient states that he is currently stressed out about getting a new therapist.  Patient also endorses stress related to his disability hearing that will occur in September.  Patient does report that he is looking forward to meeting with his primary care provider in the future.  A PHQ-9 screen was performed with the patient scoring at 13.  A GAD-7 screen was also performed with the patient scoring a 10.  Patient is alert and oriented x 4, calm, cooperative, and fully engaged in conversation during the encounter.   Patient endorses good mood.  Patient denies suicidal or homicidal ideations.  He further denies auditory or visual hallucinations and does not appear to be responding to internal/external stimuli.  Patient endorses fair sleep and receives on average 4 to 5 hours of sleep per night.  Patient endorses fair appetite and eats on average 2 meals per day.  Patient endorses alcohol consumption sparingly.  Patient endorses tobacco use and smokes on average 4 to 5 cigarettes/day.  Patient denies illicit drug use.  Visit Diagnosis:    ICD-10-CM   1. Generalized anxiety disorder  F41.1     2. Adjustment disorder with mixed anxiety and depressed mood  F43.23        Past Psychiatric History:  Adjustment disorder with mixed anxiety and depressed mood Generalized anxiety disorder Intermittent explosive disorder in adult  Past Medical History:  Past Medical History:  Diagnosis Date   Asthma     Past Surgical History:  Procedure Laterality Date   AMPUTATION Right 12/16/2020   Procedure: PARTIAL RIGHT AMPUTATION BELOW KNEE;  Surgeon: Yolonda Kida, MD;  Location: [facility] OR;  Service: Orthopedics;  Laterality: Right;   AMPUTATION Right 12/22/2020   Procedure: RIGHT BELOW KNEE AMPUTATION;  Surgeon: Nadara Mustard, MD;  Location: [facility] OR;  Service: Orthopedics;  Laterality: Right;   APPLICATION OF WOUND VAC Right 12/22/2020   Procedure: APPLICATION OF WOUND VAC;  Surgeon: Nadara Mustard, MD;  Location: [facility] OR;  Service: Orthopedics;  Laterality: Right;   THORACIC AORTIC ENDOVASCULAR STENT GRAFT N/A 12/16/2020   Procedure: THORACIC AORTIC ENDOVASCULAR STENT GRAFT;  Surgeon:  Leonie Douglas, MD;  Location: [facility] OR;  Service: Vascular;  Laterality: N/A;    Family Psychiatric History:  Patient denies family history of psychiatric illness    Family History:  Family History  Problem Relation Age of Onset   Congestive Heart Failure Maternal Grandfather    Diabetes Maternal Grandfather    Hypertension  Maternal Grandfather    Alcohol abuse Maternal Grandfather    Diabetes Maternal Aunt    Alcohol abuse Maternal Aunt    Hypertension Maternal Grandmother     Social History:  Social History   Socioeconomic History   Marital status: Single    Spouse name: Not on file   Number of children: 0   Years of education: 12   Highest education level: Not on file  Occupational History    Employer: [employer]  Tobacco Use   Smoking status: Every Day    Current packs/day: 0.50    Types: Cigarettes, E-cigarettes   Smokeless tobacco: Never  Vaping Use   Vaping status: Never Used  Substance and Sexual Activity   Alcohol use: Not Currently    Alcohol/week: 20.0 standard drinks of alcohol    Types: 20 Shots of liquor per week   Drug use: Yes    Comment: Delta 8 daily   Sexual activity: Not Currently  Other Topics Concern   Not on file  Social History Narrative   ** Merged History Encounter **       ** Merged History Encounter **   Patient lives at home with his mother Christian Hartman).    Right handed   Patient is not working right now.   Caffeine- sodas two        Social Determinants of Health   Financial Resource Strain: High Risk (09/20/2021)   Overall Financial Resource Strain (CARDIA)    Difficulty of Paying Living Expenses: Very hard  Food Insecurity: Food Insecurity Present (09/20/2021)   Hunger Vital Sign    Worried About Running Out of Food in the Last Year: Sometimes true    Ran Out of Food in the Last Year: Sometimes true  Transportation Needs: Unmet Transportation Needs (09/20/2021)   PRAPARE - Transportation    Lack of Transportation (Medical): No    Lack of Transportation (Non-Medical): Yes  Physical Activity: Inactive (09/20/2021)   Exercise Vital Sign    Days of Exercise per Week: 0 days    Minutes of Exercise per Session: 0 min  Stress: Stress Concern Present (09/20/2021)   [employer] of Occupational Health - Occupational Stress Questionnaire    Feeling  of Stress : Very much  Social Connections: Socially Isolated (09/20/2021)   Social Connection and Isolation Panel [NHANES]    Frequency of Communication with Friends and Family: Never    Frequency of Social Gatherings with Friends and Family: Never    Attends Religious Services: Never    Database administrator or Organizations: No    Attends Engineer, structural: Never    Marital Status: Separated    Allergies: No Known Allergies  Metabolic Disorder Labs: Lab Results  Component Value Date   HGBA1C 4.2 (L) 02/21/2022   MPG 73.84 02/21/2022   No results found for: "PROLACTIN" Lab Results  Component Value Date   CHOL 193 02/21/2022   TRIG 73 02/21/2022   HDL 45 02/21/2022   CHOLHDL 4.3 02/21/2022   VLDL 15 02/21/2022   LDLCALC 133 (H) 02/21/2022   Lab Results  Component Value Date   TSH 0.691 02/21/2022  Therapeutic Level Labs: No results found for: "LITHIUM" No results found for: "VALPROATE" No results found for: "CBMZ"  Current Medications: Current Outpatient Medications  Medication Sig Dispense Refill   atomoxetine (STRATTERA) 40 MG capsule Take 1 capsule (40 mg total) by mouth daily. 30 capsule 1   citalopram (CELEXA) 20 MG tablet Take 1 tablet (20 mg total) by mouth daily. 30 tablet 2   hydrOXYzine (ATARAX) 10 MG tablet Take 1 tablet (10 mg total) by mouth 3 (three) times daily as needed for anxiety. 75 tablet 1   ramelteon (ROZEREM) 8 MG tablet Take 1 tablet (8 mg total) by mouth at bedtime. 30 tablet 2   No current facility-administered medications for this visit.     Musculoskeletal: Strength & Muscle Tone: within normal limits Gait & Station: normal Patient leans: N/A  Psychiatric Specialty Exam: Review of Systems  Psychiatric/Behavioral:  Positive for decreased concentration and sleep disturbance. Negative for dysphoric mood, hallucinations, self-injury and suicidal ideas. The patient is nervous/anxious. The patient is not hyperactive.      There were no vitals taken for this visit.There is no height or weight on file to calculate BMI.  General Appearance: Casual  Eye Contact:  Good  Speech:  Clear and Coherent and Normal Rate  Volume:  Normal  Mood:  Euthymic  Affect:  Appropriate  Thought Process:  Coherent, Goal Directed, and Descriptions of Associations: Intact  Orientation:  Full (Time, Place, and Person)  Thought Content: WDL   Suicidal Thoughts:  No  Homicidal Thoughts:  No  Memory:  Immediate;   Good Recent;   Good Remote;   Fair  Judgement:  Good  Insight:  Fair  Psychomotor Activity:  Normal  Concentration:  Concentration: Good and Attention Span: Good  Recall:  Fair  Fund of Knowledge: Good  Language: Good  Akathisia:  No  Handed:  Right  AIMS (if indicated): not done  Assets:  Communication Skills Desire for Improvement Housing  ADL's:  Intact  Cognition: WNL  Sleep:  Fair   Screenings: AUDIT    Advertising copywriter from 09/20/2021 in [facility]  Alcohol Use Disorder Identification Test Final Score (AUDIT) 16      GAD-7    Flowsheet Row Clinical Support from 01/03/2023 in [facility] Clinical Support from 11/08/2022 in [facility] Video Visit from 07/26/2022 in [facility] Video Visit from 06/15/2022 in [facility] Video Visit from 02/28/2022 in [facility]  Total GAD-7 Score 10 8 8 9 9       PHQ2-9    Flowsheet Row Clinical Support from 01/03/2023 in [facility] Clinical Support from 11/08/2022 in [facility] Video Visit from 07/26/2022 in [facility] Video Visit from 06/15/2022 in [facility] Video Visit from 02/28/2022 in [facility]  PHQ-2 Total Score 4 2 3 5 3   PHQ-9 Total Score  13 13 11 17 12       Flowsheet Row Clinical Support from 01/03/2023 in [facility] Clinical Support from 11/08/2022 in [facility] Video Visit from 07/26/2022 in [facility]  C-SSRS RISK CATEGORY Low Risk Low Risk Low Risk        Assessment and Plan:   Nickson A. Yutzy is a 32 year old, African-American male with a past psychiatric history significant for adjustment  disorder with mixed anxiety and depressed mood, generalized anxiety disorder, and intermittent explosive disorder and adult, and attention and concentration deficits who presents to [facility] for follow-up and medication management.  Patient presents today encounter reporting that he wishes to not be on psychiatric medications at this time.  Patient reports that he still continues to experience ongoing depressive symptoms but states that he is able to weather through his symptoms mostly.  Patient states that he experiences anxiety whenever he is depressed.  Patient endorses the following stressors: upcoming disability hearing, looking for a new therapist, and meeting with a new primary care provider.  Patient is agreeable to continuing to follow up with this psychiatric provider just in case he would like to try medication management again.  Collaboration of Care: Collaboration of Care: Medication Management AEB provider managing patient's psychiatric medications, Psychiatrist AEB patient being seen by a mental health provider at this facility, and Referral or follow-up with counselor/therapist AEB patient being seen by a therapist outside of this facility [facility]).  Patient/Guardian was advised Release of Information must be obtained prior to any record release in order to collaborate their care with an outside provider. Patient/Guardian was advised if they have not already done so to contact the  registration department to sign all necessary forms in order for [place] to release information regarding their care.   Consent: Patient/Guardian gives verbal consent for treatment and assignment of benefits for services provided during this visit. Patient/Guardian expressed understanding and agreed to proceed.   1. Generalized anxiety disorder  2. Adjustment disorder with mixed anxiety and depressed mood  Patient to follow up in 6 weeks Provider spent a total of 28 minutes with the patient/reviewing the patient's chart  Meta Hatchet, PA 01/03/2023, 9:38 PM

## 2023-01-26 ENCOUNTER — Encounter: Payer: Self-pay | Admitting: Family

## 2023-01-26 ENCOUNTER — Ambulatory Visit (INDEPENDENT_AMBULATORY_CARE_PROVIDER_SITE_OTHER): Payer: MEDICAID | Admitting: Family

## 2023-01-26 VITALS — BP 114/76 | HR 77 | Temp 98.2°F | Ht 74.75 in | Wt 162.8 lb

## 2023-01-26 DIAGNOSIS — F32A Depression, unspecified: Secondary | ICD-10-CM | POA: Diagnosis not present

## 2023-01-26 DIAGNOSIS — R41840 Attention and concentration deficit: Secondary | ICD-10-CM | POA: Diagnosis not present

## 2023-01-26 DIAGNOSIS — Z7689 Persons encountering health services in other specified circumstances: Secondary | ICD-10-CM

## 2023-01-26 DIAGNOSIS — F411 Generalized anxiety disorder: Secondary | ICD-10-CM | POA: Diagnosis not present

## 2023-01-26 DIAGNOSIS — Z89511 Acquired absence of right leg below knee: Secondary | ICD-10-CM

## 2023-01-26 DIAGNOSIS — F4323 Adjustment disorder with mixed anxiety and depressed mood: Secondary | ICD-10-CM

## 2023-01-26 DIAGNOSIS — G894 Chronic pain syndrome: Secondary | ICD-10-CM

## 2023-01-26 DIAGNOSIS — F909 Attention-deficit hyperactivity disorder, unspecified type: Secondary | ICD-10-CM

## 2023-01-26 DIAGNOSIS — F6381 Intermittent explosive disorder: Secondary | ICD-10-CM
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
## 2023-01-26 NOTE — Patient Instructions (Signed)
Thank you for choosing Primary Care at [facility] for your medical home!    Christian Hartman was seen by Rema Fendt, NP today.   Christian Hartman's primary care provider is Ricky Stabs, NP.   For the best care possible,  you should try to see Ricky Stabs, NP whenever you come to office.   We look forward to seeing you again soon!  If you have any questions about your visit today,  please call us at [phone]  Or feel free to reach your provider via MyChart.   Keeping you healthy   Get these tests Blood pressure- Have your blood pressure checked once a year by your healthcare provider.  Normal blood pressure is 120/80. Weight- Have your body mass index (BMI) calculated to screen for obesity.  BMI is a measure of body fat based on height and weight. You can also calculate your own BMI at https://www.west-esparza.com/. Cholesterol- Have your cholesterol checked regularly starting at age 21, sooner may be necessary if you have diabetes, high blood pressure, if a family member developed heart diseases at an early age or if you smoke.  Chlamydia, HIV, and other sexual transmitted disease- Get screened each year until the age of 86 then within three months of each new sexual partner. Diabetes- Have your blood sugar checked regularly if you have high blood pressure, high cholesterol, a family history of diabetes or if you are overweight.   Get these vaccines Flu shot- Every fall. Tetanus shot- Every 10 years. Menactra- Single dose; prevents meningitis.   Take these steps Don't smoke- If you do smoke, ask your healthcare provider about quitting. For tips on how to quit, go to www.smokefree.gov or call 1-800-QUIT-NOW. Be physically active- Exercise 5 days a week for at least 30 minutes.  If you are not already physically active start slow and gradually work up to 30 minutes of moderate physical activity.  Examples of moderate activity include walking briskly, mowing the yard,  dancing, swimming bicycling, etc. Eat a healthy diet- Eat a variety of healthy foods such as fruits, vegetables, low fat milk, low fat cheese, yogurt, lean meats, poultry, fish, beans, tofu, etc.  For more information on healthy eating, go to www.thenutritionsource.org Drink alcohol in moderation- Limit alcohol intake two drinks or less a day.  Never drink and drive. Dentist- Brush and floss teeth twice daily; visit your dentis twice a year. Depression-Your emotional health is as important as your physical health.  If you're feeling down, losing interest in things you normally enjoy please talk with your healthcare provider. Gun Safety- If you keep a gun in your home, keep it unloaded and with the safety lock on.  Bullets should be stored separately. Helmet use- Always wear a helmet when riding a motorcycle, bicycle, rollerblading or skateboarding. Safe sex- If you may be exposed to a sexually transmitted infection, use a condom Seat belts- Seat bels can save your life; always wear one. Smoke/Carbon Monoxide detectors- These detectors need to be installed on the appropriate level of your home.  Replace batteries at least once a year. Skin Cancer- When out in the sun, cover up and use sunscreen SPF 15 or higher. Violence- If anyone is threatening or hurting you, please tell your healthcare provider.

## 2023-01-26 NOTE — Progress Notes (Signed)
Pt wants recommendation for ADHD asses  Pt wants to speak about medication management as well as pain management.

## 2023-02-01 ENCOUNTER — Telehealth: Payer: Self-pay | Admitting: Orthopedic Surgery

## 2023-02-01 NOTE — Telephone Encounter (Signed)
Patient called wanting a prescription for recasting of the prosthetic.CB#[phone]

## 2023-02-14 ENCOUNTER — Ambulatory Visit (INDEPENDENT_AMBULATORY_CARE_PROVIDER_SITE_OTHER): Payer: MEDICAID | Admitting: Family

## 2023-02-14 ENCOUNTER — Encounter: Payer: Self-pay | Admitting: Family

## 2023-02-14 DIAGNOSIS — Z89511 Acquired absence of right leg below knee: Secondary | ICD-10-CM | POA: Diagnosis not present
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
## 2023-04-09 ENCOUNTER — Encounter: Payer: MEDICAID | Admitting: Family

## 2023-04-09 NOTE — Progress Notes (Signed)
Erroneous encounter-disregard

## 2023-04-30 ENCOUNTER — Ambulatory Visit (INDEPENDENT_AMBULATORY_CARE_PROVIDER_SITE_OTHER): Payer: MEDICAID | Admitting: Family

## 2023-04-30 ENCOUNTER — Encounter: Payer: Self-pay | Admitting: Family

## 2023-04-30 VITALS — BP 138/83 | HR 91 | Temp 98.1°F | Resp 16 | Ht 73.0 in | Wt 162.4 lb

## 2023-04-30 DIAGNOSIS — Z13228 Encounter for screening for other metabolic disorders: Secondary | ICD-10-CM

## 2023-04-30 DIAGNOSIS — Z114 Encounter for screening for human immunodeficiency virus [HIV]: Secondary | ICD-10-CM

## 2023-04-30 DIAGNOSIS — Z23 Encounter for immunization: Secondary | ICD-10-CM | POA: Diagnosis not present

## 2023-04-30 DIAGNOSIS — Z130 Encounter for screening for diseases of the blood and blood-forming organs and certain disorders involving the immune mechanism: Secondary | ICD-10-CM

## 2023-04-30 DIAGNOSIS — Z1159 Encounter for screening for other viral diseases: Secondary | ICD-10-CM

## 2023-04-30 DIAGNOSIS — Z0000 Encounter for general adult medical examination without abnormal findings: Secondary | ICD-10-CM | POA: Diagnosis not present

## 2023-04-30 DIAGNOSIS — Z13220 Encounter for screening for lipoid disorders: Secondary | ICD-10-CM

## 2023-04-30 DIAGNOSIS — Z1329 Encounter for screening for other suspected endocrine disorder: Secondary | ICD-10-CM

## 2023-04-30 DIAGNOSIS — Z131 Encounter for screening for diabetes mellitus: Secondary | ICD-10-CM

## 2023-04-30 NOTE — Progress Notes (Signed)
-  Patient is here to have annually  complete physical examination  -Care gap address -labs taken
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
## 2023-05-15 ENCOUNTER — Other Ambulatory Visit: Payer: MEDICAID

## 2023-07-22 ENCOUNTER — Other Ambulatory Visit: Payer: Self-pay

## 2023-07-22 ENCOUNTER — Encounter (HOSPITAL_BASED_OUTPATIENT_CLINIC_OR_DEPARTMENT_OTHER): Payer: Self-pay

## 2023-07-22 ENCOUNTER — Encounter (HOSPITAL_COMMUNITY): Payer: Self-pay | Admitting: *Deleted

## 2023-07-22 ENCOUNTER — Emergency Department (HOSPITAL_COMMUNITY)
Admission: EM | Admit: 2023-07-22 | Discharge: 2023-07-22 | Disposition: A | Discharge status: Home / Self Care | Payer: MEDICAID | Source: Home / Self Care | Attending: Emergency Medicine | Admitting: Emergency Medicine

## 2023-07-22 ENCOUNTER — Emergency Department (HOSPITAL_BASED_OUTPATIENT_CLINIC_OR_DEPARTMENT_OTHER)
Admission: EM | Admit: 2023-07-22 | Discharge: 2023-07-22 | Disposition: A | Discharge status: Home / Self Care | Payer: MEDICAID | Attending: Emergency Medicine | Admitting: Emergency Medicine

## 2023-07-22 DIAGNOSIS — J45909 Unspecified asthma, uncomplicated: Secondary | ICD-10-CM | POA: Insufficient documentation

## 2023-07-22 DIAGNOSIS — R112 Nausea with vomiting, unspecified: Secondary | ICD-10-CM | POA: Insufficient documentation

## 2023-07-22 DIAGNOSIS — K0889 Other specified disorders of teeth and supporting structures: Secondary | ICD-10-CM | POA: Insufficient documentation

## 2023-07-22 DIAGNOSIS — T368X5A Adverse effect of other systemic antibiotics, initial encounter: Secondary | ICD-10-CM | POA: Insufficient documentation

## 2023-07-22 DIAGNOSIS — T50905A Adverse effect of unspecified drugs, medicaments and biological substances, initial encounter: Secondary | ICD-10-CM

## 2023-07-22 DIAGNOSIS — D649 Anemia, unspecified: Secondary | ICD-10-CM | POA: Insufficient documentation

## 2023-07-22 LAB — CBC WITH DIFFERENTIAL/PLATELET
Abs Immature Granulocytes: 0.04 10*3/uL (ref 0.00–0.07)
Basophils Absolute: 0 10*3/uL (ref 0.0–0.1)
Basophils Relative: 1 %
Eosinophils Absolute: 0.1 10*3/uL (ref 0.0–0.5)
Eosinophils Relative: 1 %
HCT: 27.7 % — ABNORMAL LOW (ref 39.0–52.0)
Hemoglobin: 8.9 g/dL — ABNORMAL LOW (ref 13.0–17.0)
Immature Granulocytes: 1 %
Lymphocytes Relative: 22 %
Lymphs Abs: 1.8 10*3/uL (ref 0.7–4.0)
MCH: 32.8 pg (ref 26.0–34.0)
MCHC: 32.1 g/dL (ref 30.0–36.0)
MCV: 102.2 fL — ABNORMAL HIGH (ref 80.0–100.0)
Monocytes Absolute: 0.4 10*3/uL (ref 0.1–1.0)
Monocytes Relative: 5 %
Neutro Abs: 5.8 10*3/uL (ref 1.7–7.7)
Neutrophils Relative %: 70 %
Platelets: 158 10*3/uL (ref 150–400)
RBC: 2.71 MIL/uL — ABNORMAL LOW (ref 4.22–5.81)
RDW: 12.7 % (ref 11.5–15.5)
WBC: 8.2 10*3/uL (ref 4.0–10.5)
nRBC: 0 % (ref 0.0–0.2)

## 2023-07-22 LAB — HEPATIC FUNCTION PANEL
ALT: 29 U/L (ref 0–44)
AST: 33 U/L (ref 15–41)
Albumin: 3.6 g/dL (ref 3.5–5.0)
Alkaline Phosphatase: 51 U/L (ref 38–126)
Bilirubin, Direct: 0.3 mg/dL — ABNORMAL HIGH (ref 0.0–0.2)
Indirect Bilirubin: 0.7 mg/dL (ref 0.3–0.9)
Total Bilirubin: 1 mg/dL (ref 0.0–1.2)
Total Protein: 6.6 g/dL (ref 6.5–8.1)

## 2023-07-22 LAB — BASIC METABOLIC PANEL
Anion gap: 10 (ref 5–15)
BUN: 13 mg/dL (ref 6–20)
CO2: 21 mmol/L — ABNORMAL LOW (ref 22–32)
Calcium: 9.1 mg/dL (ref 8.9–10.3)
Chloride: 108 mmol/L (ref 98–111)
Creatinine, Ser: 0.93 mg/dL (ref 0.61–1.24)
GFR, Estimated: >60 mL/min (ref >60)
Glucose, Bld: 105 mg/dL — ABNORMAL HIGH (ref 70–99)
Potassium: 3.6 mmol/L (ref 3.5–5.1)
Sodium: 139 mmol/L (ref 135–145)

## 2023-07-22 LAB — LIPASE, BLOOD: Lipase: 19 U/L (ref 11–51)

## 2023-07-22 LAB — CBG MONITORING, ED: Glucose-Capillary: 106 mg/dL — ABNORMAL HIGH (ref 70–99)

## 2023-07-22 MED ORDER — AMOXICILLIN-POT CLAVULANATE 875-125 MG PO TABS
1.0000 | ORAL_TABLET | Freq: Once | ORAL | Status: AC
  Administered 2023-07-22: 1 via ORAL
  Filled 2023-07-22: qty 1

## 2023-07-22 MED ORDER — SODIUM CHLORIDE 0.9 % IV BOLUS
1000.0000 mL | Freq: Once | INTRAVENOUS | Status: AC
  Administered 2023-07-22: 1000 mL via INTRAVENOUS

## 2023-07-22 MED ORDER — PANTOPRAZOLE SODIUM 40 MG IV SOLR
40.0000 mg | Freq: Once | INTRAVENOUS | Status: AC
  Administered 2023-07-22: 40 mg via INTRAVENOUS
  Filled 2023-07-22: qty 10

## 2023-07-22 MED ORDER — ONDANSETRON HCL 4 MG PO TABS: 4.0000 mg | ORAL_TABLET | Freq: Four times a day (QID) | ORAL | 0 refills | Status: AC

## 2023-07-22 MED ORDER — AMOXICILLIN-POT CLAVULANATE 875-125 MG PO TABS: 1.0000 | ORAL_TABLET | Freq: Two times a day (BID) | ORAL | 0 refills | Status: AC

## 2023-07-22 MED ORDER — LIDOCAINE VISCOUS HCL 2 % MT SOLN
10.0000 mL | Freq: Once | OROMUCOSAL | Status: AC
  Administered 2023-07-22: 10 mL via OROMUCOSAL
  Filled 2023-07-22: qty 15

## 2023-07-22 MED ORDER — BUPIVACAINE-EPINEPHRINE (PF) 0.5% -1:200000 IJ SOLN
1.8000 mL | Freq: Once | INTRAMUSCULAR | Status: AC
  Administered 2023-07-22: 1.8 mL

## 2023-07-22 MED ORDER — LORAZEPAM 2 MG/ML IJ SOLN
1.0000 mg | Freq: Once | INTRAMUSCULAR | Status: AC
  Administered 2023-07-22: 1 mg via INTRAVENOUS
  Filled 2023-07-22: qty 1

## 2023-07-22 MED ORDER — FAMOTIDINE 20 MG PO TABS: 20.0000 mg | ORAL_TABLET | Freq: Two times a day (BID) | ORAL | 0 refills | Status: AC
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
## 2023-07-22 NOTE — ED Triage Notes (Addendum)
Returns to ED by [facility]. Here last night for dental pain. Prescribed and given augmentin on empty stomach. D/c'd around 0300, went home and went to bed. Woke around 1000 and began vomiting. Reports V x10, "bile and dry heaves". Onset 1000. Denies diarrhea, fever or other sx. Denies current tooth pain. NSL 20g L AC and zofran 4mg  initiated by EMS. VSS. CBG 130. No emesis for EMS. Was cool and clammy, now skin W&D. Did not get Rx filled.

## 2023-07-22 NOTE — ED Triage Notes (Signed)
Pt POV d/t upper left dental pain.  Pt states he had a cavity and then broke it but now having lots of pain there.  Pt does not have a dentist and needs referrals.

## 2023-07-22 NOTE — Discharge Instructions (Signed)
Please follow-up with the dentist tomorrow in regards to your dental pain.  Today your labs show that your blood count was low and you deferred a rectal exam.  Please watch your stool and if your stool becomes black or red please return to the ER.  Please remain hydrated and if it is tolerating some change or worsen return to the ER.

## 2023-07-22 NOTE — ED Provider Notes (Signed)
[facility] AT [facility] Provider Note   CSN: [id number] Arrival date & time: 07/22/23  1357     History  Chief Complaint  Patient presents with   Emesis    Abdo Christian Hartman is a 33 y.o. male history of asthma,?  Seizure, traumatic subdural hemorrhage, MDD, cannabis use, GAD presented for nausea and vomiting that began after he was seen last night at 3 AM.  Patient came in for dental pain and was given his first dose of Augmentin but soon after leaving began having nonstop nausea vomiting.  Patient denies any fevers.  Patient has not taken any more dose of the Augmentin that was prescribed to him.  Patient states that that nausea vomiting is making him anxious which in turn makes him more nauseous.  Patient denies fevers.  Patient denies chest pain or shortness of breath.  Patient denies diarrhea constipation dysuria.  Patient received Ativan from EMS to no relief.  Home Medications Prior to Admission medications   Medication Sig Start Date End Date Taking? Authorizing Provider  famotidine (PEPCID) 20 MG tablet Take 1 tablet (20 mg total) by mouth 2 (two) times daily for 5 days. 07/22/23 07/27/23 Yes Takesha Steger, Beverly Gust, PA-C  ondansetron (ZOFRAN) 4 MG tablet Take 1 tablet (4 mg total) by mouth every 6 (six) hours. 07/22/23  Yes Evlyn Kanner T, PA-C  amoxicillin-clavulanate (AUGMENTIN) 875-125 MG tablet Take 1 tablet by mouth every 12 (twelve) hours. 07/22/23   Nira Conn, MD  atomoxetine (STRATTERA) 40 MG capsule Take 1 capsule (40 mg total) by mouth daily. Patient not taking: Reported on 04/30/2023 06/15/22 06/15/23  Meta Hatchet, PA  citalopram (CELEXA) 20 MG tablet Take 1 tablet (20 mg total) by mouth daily. Patient not taking: Reported on 04/30/2023 11/08/22 11/08/23  Meta Hatchet, PA  hydrOXYzine (ATARAX) 10 MG tablet Take 1 tablet (10 mg total) by mouth 3 (three) times daily as needed for anxiety. Patient not taking: Reported on  04/30/2023 02/28/22   Karel Jarvis E, PA  ramelteon (ROZEREM) 8 MG tablet Take 1 tablet (8 mg total) by mouth at bedtime. Patient not taking: Reported on 04/30/2023 02/28/22   Meta Hatchet, PA      Allergies    Patient has no known allergies.    Review of Systems   Review of Systems  Gastrointestinal:  Positive for vomiting.    Physical Exam Updated Vital Signs BP 125/83 (BP Location: Left Arm)   Pulse 60   Resp 20   Wt 81.6 kg   SpO2 100%   BMI 23.11 kg/m  Physical Exam Vitals reviewed.  Constitutional:      General: He is not in acute distress.    Appearance: He is diaphoretic.  HENT:     Head: Normocephalic and atraumatic.  Eyes:     Extraocular Movements: Extraocular movements intact.     Conjunctiva/sclera: Conjunctivae normal.     Pupils: Pupils are equal, round, and reactive to light.  Cardiovascular:     Rate and Rhythm: Normal rate and regular rhythm.     Pulses: Normal pulses.     Heart sounds: Normal heart sounds.     Comments: 2+ bilateral radial/dorsalis pedis pulses with regular rate Pulmonary:     Effort: Pulmonary effort is normal. No respiratory distress.     Breath sounds: Normal breath sounds.  Abdominal:     Palpations: Abdomen is soft.     Tenderness: There is no abdominal tenderness. There  is no guarding or rebound.  Musculoskeletal:        General: Normal range of motion.     Cervical back: Normal range of motion and neck supple.     Comments: 5 out of 5 bilateral grip/leg extension strength  Skin:    General: Skin is warm.     Capillary Refill: Capillary refill takes less than 2 seconds.  Neurological:     General: No focal deficit present.     Mental Status: He is alert and oriented to person, place, and time.     Comments: Sensation intact in all 4 limbs  Psychiatric:        Mood and Affect: Mood normal.     ED Results / Procedures / Treatments   Labs (all labs ordered are listed, but only abnormal results are  displayed) Labs Reviewed  BASIC METABOLIC PANEL - Abnormal; Notable for the following components:      Result Value   CO2 21 (*)    Glucose, Bld 105 (*)    All other components within normal limits  CBC WITH DIFFERENTIAL/PLATELET - Abnormal; Notable for the following components:   RBC 2.71 (*)    Hemoglobin 8.9 (*)    HCT 27.7 (*)    MCV 102.2 (*)    All other components within normal limits  HEPATIC FUNCTION PANEL - Abnormal; Notable for the following components:   Bilirubin, Direct 0.3 (*)    All other components within normal limits  CBG MONITORING, ED - Abnormal; Notable for the following components:   Glucose-Capillary 106 (*)    All other components within normal limits  LIPASE, BLOOD  URINALYSIS, ROUTINE W REFLEX MICROSCOPIC    EKG None  Radiology No results found.  Procedures Procedures    Medications Ordered in ED Medications  pantoprazole (PROTONIX) injection 40 mg (40 mg Intravenous Given 07/22/23 1614)  LORazepam (ATIVAN) injection 1 mg (1 mg Intravenous Given 07/22/23 1615)  sodium chloride 0.9 % bolus 1,000 mL (0 mLs Intravenous Stopped 07/22/23 1713)    ED Course/ Medical Decision Making/ A&P                                 Medical Decision Making Amount and/or Complexity of Data Reviewed Labs: ordered.  Risk Prescription drug management.   Bertram Rayetta Pigg 33 y.o. presented today for abdominal pain.  Working DDx that I considered at this time includes, but not limited to, medication reaction, gastroenteritis, colitis, small bowel obstruction, appendicitis, cholecystitis, hepatobiliary pathology, gastritis, PUD, ACS, aortic dissection, diverticulosis/diverticulitis, pancreatitis, nephrolithiasis, medication induced, AAA, UTI, pyelonephritis, testicular torsion, GIB.  R/o DDx: gastroenteritis, colitis, small bowel obstruction, appendicitis, cholecystitis, hepatobiliary pathology, gastritis, PUD, ACS, aortic dissection,  diverticulosis/diverticulitis, pancreatitis, nephrolithiasis,  AAA, UTI, pyelonephritis, testicular torsion: These are considered less likely due to history of present illness, physical exam, labs/imaging findings.  However cannot fully assess as patient does not want imaging or rectal exam.  Review of prior external notes: 04/30/2023 office visit  Unique Tests and My Independent Interpretation:  CBC with differential: Anemia 8.9 BMP: Unremarkable Lipase: Unremarkable Hepatic function panel: Unremarkable EKG: Rate, rhythm, axis, intervals all examined and without medically relevant abnormality. ST segments without concerns for elevations  Social Determinants of Health: EtOH/Substance Abuse  Discussion with Independent Historian:  Sister  Discussion of Management of Tests: None  Risk: Medium: prescription drug management  Risk Stratification Score: None  Plan: On exam patient was no  acute distress with stable vitals.  Patient had no abdominal tenderness on exam or peritoneal signs.  Patient states symptoms began after having the Augmentin and so do feel this could be reaction to the medications.  Patient does not have any previous allergies to medications and has tolerated Augmentin in the past.  Will give Protonix for his stomach and Ativan as patient want something for his anxiety as well so this will help with anxiety and the nausea.  Patient was dry heaving on exam.  Labs do show drop in hemoglobin to 8.9. I spoke to the patient he denies melena, hematemesis, hematochezia and states he recently donated plasma 4 days prior and thinks that may be the cause.  However during plasma should not drop his hemoglobin so we will reevaluate once patient's symptoms are under control.  Rest patient's labs are ultimately reassuring.  I went to evaluate the patient patient states he feels much better after the medicine here.  I spoke to the patient and recommended we do a rectal exam to make sure  patient does not have a GI bleed causing his acute hemoglobin drop however patient declined.  Patient does have full decision-making capacity and this conversation was witnessed by the sister and patient understands that this may lead to missed diagnoses and complications and states he does not want to have a rectal exam.  Patient states he does see the dentist tomorrow and I offered to switch his Augmentin to clindamycin however patient declined as he does not want to deal with the side effects of the medication and would like to follow-up with the dentist instead.  I offered imaging however patient declined as he states he wants to go home.  Will prescribe Pepcid along with Zofran for symptoms.  I spoke to the patient extensively about watching his stool for stool color changes and to watch his symptoms in case symptoms are change or worsen and that he will need to return to the ED if he begins having darker stools which patient verbalized understanding of.  Patient was given return precautions. Patient stable for discharge at this time.  Patient verbalized understanding of plan.  This chart was dictated using voice recognition software.  Despite best efforts to proofread,  errors can occur which can change the documentation meaning.         Final Clinical Impression(s) / ED Diagnoses Final diagnoses:  Nausea and vomiting, unspecified vomiting type  Adverse effect of drug, initial encounter  Anemia, unspecified type    Rx / DC Orders ED Discharge Orders          Ordered    famotidine (PEPCID) 20 MG tablet  2 times daily        07/22/23 1715    ondansetron (ZOFRAN) 4 MG tablet  Every 6 hours        07/22/23 1715              Remi Deter 07/22/23 1719    Charlynne Pander, MD 07/23/23 [phone]

## 2023-07-22 NOTE — Discharge Instructions (Addendum)
For pain control you may take 1000 mg of Tylenol every 8 hours as needed.  

## 2023-09-25 ENCOUNTER — Telehealth: Payer: Self-pay | Admitting: Family

## 2023-09-25 NOTE — Telephone Encounter (Signed)
 Copied from CRM [id number]. Topic: General - Other >> Sep 25, 2023  2:40 PM Elle L wrote: Reason for CRM: Hadassah Letters, Nurse Case Manager with [facility] was calling as the patient is transitioning to living in the community program. 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 Her call back number is [phone].

## 2023-09-25 NOTE — Telephone Encounter (Signed)
 Called Shelly back. Shelly want ed to  know about patient dx of trauma as far as brain injure. I explain  that it was in patient chart but was not diagnose from our provider.
